# Patient Record
Sex: Male | Born: 1935 | Race: White | Hispanic: No | Marital: Married | Smoking: Former smoker
Health system: Southern US, Community
[De-identification: ages and names within clinical notes are randomized; demographics above are authoritative.]

## PROBLEM LIST (undated history)

## (undated) DIAGNOSIS — S7290XA Unspecified fracture of unspecified femur, initial encounter for closed fracture: Secondary | ICD-10-CM

## (undated) DIAGNOSIS — I509 Heart failure, unspecified: Secondary | ICD-10-CM

## (undated) DIAGNOSIS — I10 Essential (primary) hypertension: Secondary | ICD-10-CM

## (undated) DIAGNOSIS — I4891 Unspecified atrial fibrillation: Secondary | ICD-10-CM

## (undated) DIAGNOSIS — E119 Type 2 diabetes mellitus without complications: Secondary | ICD-10-CM

## (undated) DIAGNOSIS — K219 Gastro-esophageal reflux disease without esophagitis: Secondary | ICD-10-CM

## (undated) HISTORY — PX: CARDIAC DEFIBRILLATOR PLACEMENT: SHX171

## (undated) HISTORY — DX: Type 2 diabetes mellitus without complications: E11.9

## (undated) HISTORY — DX: Essential (primary) hypertension: I10

## (undated) HISTORY — PX: CARDIAC SURGERY: SHX584

---

## 2006-08-07 ENCOUNTER — Ambulatory Visit: Payer: Self-pay | Admitting: Podiatry

## 2006-08-11 ENCOUNTER — Ambulatory Visit: Payer: Self-pay | Admitting: Podiatry

## 2006-09-01 ENCOUNTER — Inpatient Hospital Stay: Payer: Self-pay | Admitting: Podiatry

## 2006-09-02 ENCOUNTER — Other Ambulatory Visit: Payer: Self-pay

## 2007-01-16 ENCOUNTER — Ambulatory Visit: Payer: Self-pay | Admitting: Family Medicine

## 2008-04-29 ENCOUNTER — Observation Stay: Payer: Self-pay | Admitting: Internal Medicine

## 2009-10-07 ENCOUNTER — Ambulatory Visit: Payer: Self-pay | Admitting: Family Medicine

## 2009-10-21 ENCOUNTER — Ambulatory Visit: Payer: Self-pay | Admitting: Vascular Surgery

## 2010-01-14 ENCOUNTER — Ambulatory Visit: Payer: Self-pay | Admitting: Internal Medicine

## 2010-03-06 ENCOUNTER — Ambulatory Visit: Payer: Self-pay | Admitting: Internal Medicine

## 2011-01-21 ENCOUNTER — Ambulatory Visit: Payer: Self-pay | Admitting: Vascular Surgery

## 2011-04-18 ENCOUNTER — Ambulatory Visit: Payer: Self-pay

## 2011-04-19 ENCOUNTER — Ambulatory Visit: Payer: Self-pay

## 2011-06-22 ENCOUNTER — Observation Stay: Payer: Self-pay | Admitting: Internal Medicine

## 2011-08-17 ENCOUNTER — Other Ambulatory Visit: Payer: Self-pay | Admitting: Podiatry

## 2013-02-07 ENCOUNTER — Ambulatory Visit: Payer: Self-pay | Admitting: Vascular Surgery

## 2013-03-14 ENCOUNTER — Ambulatory Visit: Payer: Self-pay | Admitting: Vascular Surgery

## 2013-03-14 LAB — BASIC METABOLIC PANEL
Calcium, Total: 9.1 mg/dL (ref 8.5–10.1)
Chloride: 107 mmol/L (ref 98–107)
Co2: 26 mmol/L (ref 21–32)
Creatinine: 0.81 mg/dL (ref 0.60–1.30)
EGFR (African American): >60
Glucose: 134 mg/dL — ABNORMAL HIGH (ref 65–99)
Osmolality: 279 (ref 275–301)

## 2013-03-14 LAB — CBC
MCH: 30.8 pg (ref 26.0–34.0)
MCHC: 34.6 g/dL (ref 32.0–36.0)
Platelet: 203 10*3/uL (ref 150–440)
RDW: 13.7 % (ref 11.5–14.5)

## 2013-03-20 ENCOUNTER — Inpatient Hospital Stay: Payer: Self-pay | Admitting: Vascular Surgery

## 2013-03-21 LAB — CBC WITH DIFFERENTIAL/PLATELET
Basophil #: 0 10*3/uL (ref 0.0–0.1)
Basophil %: 0.3 %
Eosinophil %: 1.2 %
HCT: 35.5 % — ABNORMAL LOW (ref 40.0–52.0)
Lymphocyte #: 1.4 10*3/uL (ref 1.0–3.6)
Lymphocyte %: 13.5 %
MCH: 31 pg (ref 26.0–34.0)
RDW: 13.9 % (ref 11.5–14.5)
WBC: 10.5 10*3/uL (ref 3.8–10.6)

## 2013-03-21 LAB — BASIC METABOLIC PANEL
Anion Gap: 4 — ABNORMAL LOW (ref 7–16)
Co2: 24 mmol/L (ref 21–32)
Creatinine: 0.85 mg/dL (ref 0.60–1.30)
Glucose: 152 mg/dL — ABNORMAL HIGH (ref 65–99)
Potassium: 4.2 mmol/L (ref 3.5–5.1)

## 2013-03-21 LAB — APTT: Activated PTT: 37.6 secs — ABNORMAL HIGH (ref 23.6–35.9)

## 2013-03-21 LAB — PROTIME-INR: Prothrombin Time: 14.7 secs (ref 11.5–14.7)

## 2014-04-08 ENCOUNTER — Ambulatory Visit: Payer: Self-pay | Admitting: Family Medicine

## 2014-10-21 ENCOUNTER — Ambulatory Visit
Admit: 2014-10-21 | Disposition: A | Discharge status: Home / Self Care | Payer: Self-pay | Attending: Cardiology | Admitting: Cardiology

## 2014-10-21 DIAGNOSIS — I2510 Atherosclerotic heart disease of native coronary artery without angina pectoris: Secondary | ICD-10-CM

## 2014-10-21 LAB — BASIC METABOLIC PANEL
ANION GAP: 5 — AB (ref 7–16)
BUN: 15 mg/dL
Calcium, Total: 9 mg/dL
Chloride: 108 mmol/L
Co2: 24 mmol/L
Creatinine: 0.8 mg/dL
EGFR (Non-African Amer.): >60
Glucose: 157 mg/dL — ABNORMAL HIGH
POTASSIUM: 4.3 mmol/L
SODIUM: 137 mmol/L

## 2014-10-21 LAB — CBC WITH DIFFERENTIAL/PLATELET
BASOS ABS: 0 10*3/uL (ref 0.0–0.1)
BASOS PCT: 0.6 %
EOS PCT: 3.4 %
Eosinophil #: 0.2 10*3/uL (ref 0.0–0.7)
HCT: 36.1 % — AB (ref 40.0–52.0)
HGB: 11.9 g/dL — ABNORMAL LOW (ref 13.0–18.0)
LYMPHS ABS: 1.6 10*3/uL (ref 1.0–3.6)
Lymphocyte %: 22.4 %
MCH: 29.4 pg (ref 26.0–34.0)
MCHC: 32.8 g/dL (ref 32.0–36.0)
MCV: 90 fL (ref 80–100)
MONO ABS: 0.4 x10 3/mm (ref 0.2–1.0)
MONOS PCT: 5.8 %
NEUTROS ABS: 4.9 10*3/uL (ref 1.4–6.5)
NEUTROS PCT: 67.8 %
PLATELETS: 206 10*3/uL (ref 150–440)
RBC: 4.03 10*6/uL — ABNORMAL LOW (ref 4.40–5.90)
RDW: 13.7 % (ref 11.5–14.5)
WBC: 7.2 10*3/uL (ref 3.8–10.6)

## 2014-10-21 LAB — PROTIME-INR
INR: 1.1
PROTHROMBIN TIME: 14.8 s

## 2014-10-21 LAB — APTT: Activated PTT: 35.1 secs (ref 23.6–35.9)

## 2014-10-24 NOTE — Op Note (Signed)
PATIENT NAME:  Riley CruiseLANPHEAR, Justan MR#:  [id number] DATE OF BIRTH:  [birth year]  DATE OF OPERATION:  03/20/2013  PREOPERATIVE DIAGNOSES: 1.  High-grade right carotid artery stenosis.  2.  Coronary disease, status post coronary bypass grafting.  3.  Hyperlipidemia.  4.  Diabetes.  5.  Hypertension.   POSTOPERATIVE DIAGNOSES: 1.  High-grade right carotid artery stenosis.  2.  Coronary disease, status post coronary bypass grafting.  3.  Hyperlipidemia.  4.  Diabetes.  5.  Hypertension.   PROCEDURE: Right carotid endarterectomy.   SURGEON: Haeden Hudock.   ANESTHESIA: General.   BLOOD LOSS: 100 mL.   INDICATION FOR PROCEDURE: This is a gentleman I have followed for some time for his carotid artery disease. He has had a gradual progression of his disease over time and now has an approximately 80% stenosis by CT angiogram. He was brought in for an endarterectomy for stroke risk reduction. Risks and benefits were discussed. Informed consent was obtained.   DESCRIPTION OF PROCEDURE: The patient was brought to the operative suite and after an adequate level of general anesthesia was obtained he was placed in modified beach chair position. His right neck and chest were sterilely prepped and draped and a sterile surgical field was created. An incision was created along the anterior border of the sternocleidomastoid and dissected down through the platysma with electrocautery. The sternocleidomastoid was retracted laterally. The facial vein was ligated and divided between silk ties.    He had a quite low bifurcation which was known by his CT angiogram, and the incision was able to be kept reasonably small, in the lower portion of the neck. The carotid bifurcation was identified. The common carotid artery, external carotid artery, superior thyroid artery and internal carotid artery distal lesions were all encircled with vessel loops. The bulb was anesthetized with 1% lidocaine. The patient was systemically  heparinized with 6000 units of intravenous heparin for systemic anticoagulation.   Control was then pulled up on the vessel loops after this had circulated for over 4 minutes. An anterior wall arteriotomy was created with an 11 blade and extended with Potts scissors. The Pruitt-Inahara shunt was then placed. First, the internal carotid artery was flushed and de-aired, then the common carotid artery. Approximately 2 minutes elapsed from clamp to shunt placement.   Endarterectomy was then performed in standard fashion. An eversion endarterectomy was performed on the external carotid artery. The proximal end-point was cut flush with tenotomy scissors and a nice feathered distal end-point was created with gentle traction on the distal end-point in the internal carotid artery. There was a small rim of the posterior wall with the artery, where the endarterectomy was repaired with 7-0 Prolene sutures without difficulty. All loose flecks were removed and the wound was copiously irrigated with heparinized saline.   The vessel was quite large in the common carotid artery, and also generous in the internal carotid artery so I elected for a primary closure. A 6-0 Prolene was started at the distal end-point and run one-half the length of the arteriotomy, and a second 6-0 Prolene was started at the proximal end-point and run approximately one-quarter the length of the arteriotomy. The shunt was then removed. The vessel was flushed from the internal and external and common carotid arteries and then locally heparinized, and then the suture line was completed, flushing through the external carotid artery and allowing several cardiac cycles to traverse up the external carotid artery prior to releasing control. A single 6-0 Prolene patch suture was used  for hemostasis. Hemostasis was complete.   The wound was then irrigated with saline. Surgicel and Evicel topical hemostatic agents were placed and hemostasis was complete. The  sternocleidomastoid space was closed with interrupted 3-0 Vicryl sutures. The platysma was closed with a running 3-0 Vicryl and the skin was closed with a 4-0 Monocryl. Dermabond was placed as a dressing.   The patient was awakened from anesthesia, extubated and taken to the recovery room in stable condition, having tolerated the procedure well. He was moving all 4 extremities and following commands.     ____________________________ Annice Needy, MD jsd:dm D: 03/20/2013 09:30:12 ET T: 03/20/2013 09:40:11 ET JOB#: [id number]  cc: Annice Needy, MD, <Dictator> Jorje Guild. Beckey Downing, MD Annice Needy MD ELECTRONICALLY SIGNED 03/27/2013 11:39

## 2014-10-26 NOTE — H&P (Signed)
PATIENT NAME:  Riley CruiseLANPHEAR, Aadam MR#:  [id number] DATE OF BIRTH:  1929/09/09  DATE OF ADMISSION:  06/22/2011  PRIMARY CARE PHYSICIAN: Barry BrunnerGlenn Willett, MD   CHIEF COMPLAINT: Weakness.   HISTORY OF PRESENT ILLNESS: This is a 81 year old man who has been having a cough for the past two days. He has been feeling weak for the last few days. The wife this afternoon noticed that her husband was driving a little bit erratic and had him pull over. Then she couldn't even get him out of the car. He couldn't get out of the car by himself. He needed help from a man that was walking by and got him around to the passenger side where she was wanting to drive to the hospital but he did not want to go so they drove home. Then they could not get him out of the car when he was home. They called 9-1-1 and he couldn't get out of the car to walk to the ambulance. They needed to help him. He has been coughing for two days, being very weak, could not get out of the car, unable to ambulate. Complains of total body weakness. He has a low-grade fever. He had some nausea and vomiting this a.m. He normally has balance issues secondary to the neuropathy with diabetes. In the ER, his CT scan of the head showed no acute intracranial process. Hospitalist services were contacted for evaluation since he cannot walk.   PAST MEDICAL HISTORY:  1. Coronary artery disease.  2. Diabetes.  3. Neuropathy. 4. Peripheral vascular disease with carotid stenosis.  5. Benign prostatic hypertrophy.  6. Hypertension.  7. Hyperlipidemia.   PAST SURGICAL HISTORY:  1. CABG. 2. Defibrillator. 3. Pacemaker. 4. Infected toe surgery.   ALLERGIES: No known drug allergies.   MEDICATIONS:  1. Metformin 1000 mg twice a day. 2. Flomax 0.4 mg b.i.d.  3. Plavix 75 mg daily.  4. Lisinopril 10 mg daily.  5. Lovastatin 40 mg daily.  6. Aspirin 81 mg daily.   SOCIAL HISTORY: No smoking. No alcohol. No drug use. Used to build houses in the past.   FAMILY  HISTORY: Mother died at 7885, diabetic complications. Father died at 5083 of coronary artery disease.   REVIEW OF SYSTEMS: CONSTITUTIONAL: Positive for fever. Positive for cold sweats. Positive for weakness. EYES: He does wear glasses and has focusing issues. EARS, NOSE, MOUTH, AND THROAT: Decreased hearing. Positive for runny nose. No sore throat. No difficulty swallowing. CARDIOVASCULAR: No chest pain. No palpitations. RESPIRATORY: Positive for cough. No shortness of breath. No sputum. No hemoptysis. GASTROINTESTINAL: Positive for nausea. Positive for vomiting. No abdominal pain. No diarrhea. No constipation. No bright red blood per rectum. No melena. GENITOURINARY: Positive for burning on urination. No hematuria. MUSCULOSKELETAL: Positive for aching in his joints. NEUROLOGIC: No fainting or blackouts. PSYCHIATRIC: No anxiety or depression. ENDOCRINE: No thyroid problems. HEMATOLOGIC/LYMPHATIC: No anemia. No easy bruising or bleeding.   PHYSICAL EXAMINATION:   VITAL SIGNS: Temperature 99.1, pulse 71, respirations 18, blood pressure 144/68, pulse oximetry 93% on room air.   GENERAL: No respiratory distress.   EYES: Conjunctivae and lids normal. Pupils equal, round, and reactive to light. Extraocular muscles intact. No nystagmus.   EARS, NOSE, MOUTH, AND THROAT: Tympanic membrane no erythema. Nasal mucosa no erythema. Throat no erythema. No exudate seen. Lips and gums normal.   NECK: No JVD. No bruits. No lymphadenopathy. No thyromegaly. No thyroid nodules palpated.   RESPIRATORY: Decreased breath sounds bilaterally, coughing with deep breath. Coarse  breath sounds. No rales or wheeze heard.   CARDIOVASCULAR: S1, S2 normal. 2/6 systolic ejection murmur. Carotid upstroke 2+ bilaterally. Positive bruit on the right.   EXTREMITIES: Dorsalis pedis pulses 2+ bilaterally. No edema of the lower extremity.   ABDOMEN: Soft, nontender. No organomegaly/splenomegaly. Normoactive bowel sounds. No masses felt.    LYMPHATIC: No lymph nodes in the neck.   MUSCULOSKELETAL: No clubbing, edema, or cyanosis.   SKIN: No rashes or ulcers seen.   NEUROLOGIC: Cranial nerves II through XII grossly intact. Deep tendon reflexes 1+ bilateral lower extremity. Power 5/5 upper and lower extremities.   PSYCHIATRIC: The patient is oriented to person, place, and time.   LABORATORY, DIAGNOSTIC, AND RADIOLOGICAL DATA: Urinalysis shows 1+ ketones, trace leukocyte esterase. Influenza negative. CT scan of the head no acute intracranial process. Chest x-ray negative. White blood cell count 7.9, hemoglobin and hematocrit 12.3 and 37.1, platelet count 160, glucose 149, BUN 12, creatinine 1.0, sodium 139, potassium 4.1, chloride 105, CO2 24, calcium 8.5. Liver function tests normal. Troponin negative. EKG paced, 69 beats per minute.   ASSESSMENT AND PLAN:  1. Weakness with dehydration. The patient states that his strength is getting a little bit better with the IV fluids. Since he was unable to get out of the car, the wife feels more comfortable watching him overnight. Will admit as an observation. Get a PT evaluation. Most likely this is secondary to dehydration and infection.  2. Bronchitis. Will give Rocephin and Zithromax. Urinalysis also positive. Will send off a urine culture. Give IV fluid hydration. Cough suppression.  3. History of cerebrovascular accident. Plavix and aspirin continued; also, history of carotid stenosis.  4. Diabetes with neuropathy. Continue metformin and add sliding scale.  5. Benign prostatic hypertrophy. Continue Flomax.  6. Hyperlipidemia. Continue lovastatin. Can consider stopping lovastatin if weakness persists after infection.  7. Hypertension. Continue lisinopril.   TIME SPENT ON ADMISSION: 55 minutes.   CODE STATUS: The patient is a FULL CODE.   ____________________________ Herschell Dimes. Renae Gloss, MD rjw:drc D: 06/22/2011 19:44:20 ET T: 06/23/2011 19:14:78 ET JOB#: [id number]  cc: Herschell Dimes. Renae Gloss, MD, <Dictator> Jorje Guild. Beckey Downing, MD Salley Scarlet MD ELECTRONICALLY SIGNED 07/20/2011 15:54

## 2014-10-29 ENCOUNTER — Ambulatory Visit
Admit: 2014-10-29 | Disposition: A | Discharge status: Home / Self Care | Payer: Self-pay | Attending: Cardiology | Admitting: Cardiology

## 2014-11-02 NOTE — Op Note (Addendum)
PATIENT NAME:  Riley CruiseLANPHEAR, Abdoulaye MR#:  [id number] DATE OF BIRTH:  1930-06-22  DATE OF PROCEDURE:  10/29/2014  PRIMARY CARE PHYSICIAN: Leandrew KoyanagiAmarjit S. Virk, MD   PREPROCEDURE DIAGNOSES: Ischemic cardiomyopathy, chronic systolic congestive heart failure, and biventricular implantable cardioverter defibrillator at elective replacement indication.   INDICATION: The patient is an 81 year old gentleman with known coronary artery disease, status post bypass graft surgery, with ischemic cardiomyopathy and chronic systolic congestive heart failure. The patient has a biventricular ICD. Recent interrogation has shown the ICD generator was at elective replacement indication.   PROCEDURE IN DETAIL: The patient was brought to the operating room in a fasting state. The left pectoral region was prepped and draped in the usual sterile manner. Anesthesia was obtained with 1% Xylocaine locally. A 6 cm incision was performed over the left pectoral region. The ICD generator was retrieved by electrocautery and blunt dissection. The leads were disconnected from the old ICD generator and connected to a new biventricular ICD generator (Medtronic [id number], Viva XT CRT-D. The pocket was irrigated with gentamicin solution. The new biventricular ICD generator was positioned in the pocket. The pocket was closed with 2-0 and 4-0 Vicryl, respectively. Steri-Strips and pressure dressing were applied     ____________________________ Marcina MillardAlexander Antinio Sanderfer, MD ap:mw D: 10/29/2014 13:03:35 ET T: 10/29/2014 16:33:12 ET JOB#: [id number]  cc: Marcina MillardAlexander Khyleigh Furney, MD, <Dictator> Marcina MillardALEXANDER Adriauna Campton MD ELECTRONICALLY SIGNED 11/01/2014 11:11

## 2015-04-06 ENCOUNTER — Ambulatory Visit (INDEPENDENT_AMBULATORY_CARE_PROVIDER_SITE_OTHER): Payer: Medicare Other | Admitting: Podiatry

## 2015-04-06 ENCOUNTER — Ambulatory Visit (INDEPENDENT_AMBULATORY_CARE_PROVIDER_SITE_OTHER): Payer: Medicare Other

## 2015-04-06 ENCOUNTER — Telehealth: Payer: Self-pay | Admitting: *Deleted

## 2015-04-06 ENCOUNTER — Encounter: Payer: Self-pay | Admitting: Podiatry

## 2015-04-06 DIAGNOSIS — R52 Pain, unspecified: Secondary | ICD-10-CM

## 2015-04-06 DIAGNOSIS — R269 Unspecified abnormalities of gait and mobility: Secondary | ICD-10-CM

## 2015-04-06 DIAGNOSIS — E1142 Type 2 diabetes mellitus with diabetic polyneuropathy: Secondary | ICD-10-CM | POA: Diagnosis not present

## 2015-04-06 MED ORDER — GABAPENTIN 100 MG PO CAPS: 100.0000 mg | ORAL_CAPSULE | Freq: Every day | ORAL | Status: DC

## 2015-04-06 NOTE — Telephone Encounter (Signed)
[facility] - [address] states Dr. Al Corpus wrote pt Gabapentin  #3 and she wanted to know if he meant #30.  I reviewed chart and it was #30 +3 refills.  Called to [facility] with the correction.

## 2015-04-06 NOTE — Progress Notes (Signed)
   Subjective:    Patient ID: Riley Sanchez., male    DOB: 12-Sep-1929, 81 y.o.   MRN: [id number]  HPI: He presents today with a several month history of numbness in his feet. He states that I can hardly feel the pedals to drive any longer and my shoes sometimes come off of my feet without me knowing. He also relates some tingling and burning at nighttime. He states his blood sugars under control with a good hemoglobin A1c of 7.0. He also states that he is unstable and feels on the verge of falling at all times. He states that he gets dizzy occasionally and without holding onto the walls or friends he would fall.  Review of Systems  HENT: Positive for hearing loss.   Musculoskeletal: Positive for myalgias and gait problem.  Neurological: Positive for weakness and numbness.  All other systems reviewed and are negative.      Objective:   Physical Exam: 81 year old male presents vital signs stable alert and oriented 3. Walks with a slow deliberate gait in no acute distress. Pulses are strongly palpable. Neurologic sensorium is decreased per Semmes-Weinstein monofilament and vibratory sensation to the level of the ankles. Deep tendon reflexes are not elicitable muscle strength +4 out of 5 dorsiflexion plantar flexors and inverters and everters all intrinsic musculature is intact. Orthopedic evaluation demonstrate all joints distal to the ankle for range of motion without crepitation. Mild HAV deformity hammertoe deformity noted bilateral but are asymptomatic. Radiographic evaluation does not demonstrate any type of major osseous abnormalities. Cutaneous evaluation demonstrates no open lesions or wounds. Skin is supple and well hydrated.        Assessment & Plan:  Assessment: Diabetic peripheral neuropathy bilateral.  Plan: Discussed etiology pathology conservative versus surgical therapies. I highly recommended that he discontinue the use of his automobile particularly not being able to feel  the pedals. I suggested that he give up driving completely. I did offer him a gabapentin 100 mg 1 by mouth daily at bedtime 30. We will try this for a month and see how this works for him. I also suggested physical therapy with gait training. I will follow-up with him in 1 month questions or concerns he will notify us immediately.  Arbutus Ped DPM

## 2015-05-06 ENCOUNTER — Encounter: Payer: Medicare Other | Admitting: Podiatry

## 2015-05-18 ENCOUNTER — Emergency Department: Payer: Medicare Other

## 2015-05-18 ENCOUNTER — Encounter: Payer: Self-pay | Admitting: Emergency Medicine

## 2015-05-18 ENCOUNTER — Inpatient Hospital Stay
Admission: EM | Admit: 2015-05-18 | Discharge: 2015-05-22 | DRG: 481 | Disposition: A | Discharge status: Skilled Nursing Facility | Payer: Medicare Other | Attending: Internal Medicine | Admitting: Internal Medicine

## 2015-05-18 DIAGNOSIS — Z7982 Long term (current) use of aspirin: Secondary | ICD-10-CM

## 2015-05-18 DIAGNOSIS — Z7901 Long term (current) use of anticoagulants: Secondary | ICD-10-CM

## 2015-05-18 DIAGNOSIS — I480 Paroxysmal atrial fibrillation: Secondary | ICD-10-CM | POA: Diagnosis present

## 2015-05-18 DIAGNOSIS — Z79899 Other long term (current) drug therapy: Secondary | ICD-10-CM

## 2015-05-18 DIAGNOSIS — Z951 Presence of aortocoronary bypass graft: Secondary | ICD-10-CM

## 2015-05-18 DIAGNOSIS — Z881 Allergy status to other antibiotic agents status: Secondary | ICD-10-CM

## 2015-05-18 DIAGNOSIS — Y92009 Unspecified place in unspecified non-institutional (private) residence as the place of occurrence of the external cause: Secondary | ICD-10-CM

## 2015-05-18 DIAGNOSIS — E1142 Type 2 diabetes mellitus with diabetic polyneuropathy: Secondary | ICD-10-CM | POA: Diagnosis present

## 2015-05-18 DIAGNOSIS — Z87891 Personal history of nicotine dependence: Secondary | ICD-10-CM

## 2015-05-18 DIAGNOSIS — I5022 Chronic systolic (congestive) heart failure: Secondary | ICD-10-CM | POA: Diagnosis present

## 2015-05-18 DIAGNOSIS — K5900 Constipation, unspecified: Secondary | ICD-10-CM | POA: Diagnosis not present

## 2015-05-18 DIAGNOSIS — Z9889 Other specified postprocedural states: Secondary | ICD-10-CM

## 2015-05-18 DIAGNOSIS — Z888 Allergy status to other drugs, medicaments and biological substances status: Secondary | ICD-10-CM | POA: Diagnosis not present

## 2015-05-18 DIAGNOSIS — Z95810 Presence of automatic (implantable) cardiac defibrillator: Secondary | ICD-10-CM

## 2015-05-18 DIAGNOSIS — I255 Ischemic cardiomyopathy: Secondary | ICD-10-CM | POA: Diagnosis present

## 2015-05-18 DIAGNOSIS — Z419 Encounter for procedure for purposes other than remedying health state, unspecified: Secondary | ICD-10-CM

## 2015-05-18 DIAGNOSIS — W19XXXA Unspecified fall, initial encounter: Secondary | ICD-10-CM | POA: Diagnosis present

## 2015-05-18 DIAGNOSIS — S72002A Fracture of unspecified part of neck of left femur, initial encounter for closed fracture: Secondary | ICD-10-CM | POA: Diagnosis present

## 2015-05-18 DIAGNOSIS — I110 Hypertensive heart disease with heart failure: Secondary | ICD-10-CM | POA: Diagnosis present

## 2015-05-18 DIAGNOSIS — Z8781 Personal history of (healed) traumatic fracture: Secondary | ICD-10-CM

## 2015-05-18 DIAGNOSIS — S72009A Fracture of unspecified part of neck of unspecified femur, initial encounter for closed fracture: Secondary | ICD-10-CM | POA: Diagnosis present

## 2015-05-18 DIAGNOSIS — Z7902 Long term (current) use of antithrombotics/antiplatelets: Secondary | ICD-10-CM | POA: Diagnosis not present

## 2015-05-18 LAB — BASIC METABOLIC PANEL
ANION GAP: 5 (ref 5–15)
BUN: 15 mg/dL (ref 6–20)
CHLORIDE: 107 mmol/L (ref 101–111)
CO2: 26 mmol/L (ref 22–32)
Calcium: 9.2 mg/dL (ref 8.9–10.3)
Creatinine, Ser: 0.86 mg/dL (ref 0.61–1.24)
GFR calc non Af Amer: >60 mL/min (ref >60)
Glucose, Bld: 179 mg/dL — ABNORMAL HIGH (ref 65–99)
POTASSIUM: 4.7 mmol/L (ref 3.5–5.1)
Sodium: 138 mmol/L (ref 135–145)

## 2015-05-18 LAB — CBC WITH DIFFERENTIAL/PLATELET
BASOS PCT: 0 %
Basophils Absolute: 0 10*3/uL (ref 0–0.1)
Eosinophils Absolute: 0.1 10*3/uL (ref 0–0.7)
Eosinophils Relative: 1 %
HEMATOCRIT: 37.6 % — AB (ref 40.0–52.0)
HEMOGLOBIN: 12.4 g/dL — AB (ref 13.0–18.0)
Lymphocytes Relative: 10 %
Lymphs Abs: 1.3 10*3/uL (ref 1.0–3.6)
MCH: 29.5 pg (ref 26.0–34.0)
MCHC: 33 g/dL (ref 32.0–36.0)
MCV: 89.5 fL (ref 80.0–100.0)
MONOS PCT: 4 %
Monocytes Absolute: 0.6 10*3/uL (ref 0.2–1.0)
NEUTROS ABS: 11.1 10*3/uL — AB (ref 1.4–6.5)
NEUTROS PCT: 85 %
Platelets: 210 10*3/uL (ref 150–440)
RBC: 4.2 MIL/uL — ABNORMAL LOW (ref 4.40–5.90)
RDW: 13.5 % (ref 11.5–14.5)
WBC: 13.1 10*3/uL — ABNORMAL HIGH (ref 3.8–10.6)

## 2015-05-18 LAB — PROTIME-INR
INR: 2.6
Prothrombin Time: 27.5 seconds — ABNORMAL HIGH (ref 11.4–15.0)

## 2015-05-18 LAB — TYPE AND SCREEN
ABO/RH(D): A POS
Antibody Screen: NEGATIVE

## 2015-05-18 LAB — MRSA PCR SCREENING: MRSA by PCR: NEGATIVE

## 2015-05-18 LAB — GLUCOSE, CAPILLARY: GLUCOSE-CAPILLARY: 196 mg/dL — AB (ref 65–99)

## 2015-05-18 LAB — ABO/RH: ABO/RH(D): A POS

## 2015-05-18 LAB — ALBUMIN: ALBUMIN: 4.2 g/dL (ref 3.5–5.0)

## 2015-05-18 LAB — APTT: APTT: 43 s — AB (ref 24–36)

## 2015-05-18 MED ORDER — ACETAMINOPHEN 650 MG RE SUPP: 650.0000 mg | Freq: Four times a day (QID) | RECTAL | Status: DC | PRN

## 2015-05-18 MED ORDER — MORPHINE SULFATE (PF) 2 MG/ML IV SOLN
2.0000 mg | INTRAVENOUS | Status: DC | PRN
  Administered 2015-05-19 (×2): 2 mg via INTRAVENOUS
  Filled 2015-05-18 (×2): qty 1

## 2015-05-18 MED ORDER — ONDANSETRON HCL 4 MG/2ML IJ SOLN
4.0000 mg | Freq: Four times a day (QID) | INTRAMUSCULAR | Status: DC | PRN
  Administered 2015-05-19: 4 mg via INTRAVENOUS

## 2015-05-18 MED ORDER — SODIUM CHLORIDE 0.9 % IV SOLN
INTRAVENOUS | Status: DC
  Administered 2015-05-18 – 2015-05-19 (×3): via INTRAVENOUS

## 2015-05-18 MED ORDER — PANTOPRAZOLE SODIUM 40 MG PO TBEC
40.0000 mg | DELAYED_RELEASE_TABLET | Freq: Every day | ORAL | Status: DC
  Administered 2015-05-18 – 2015-05-22 (×4): 40 mg via ORAL
  Filled 2015-05-18 (×8): qty 1

## 2015-05-18 MED ORDER — ONDANSETRON HCL 4 MG/2ML IJ SOLN
4.0000 mg | Freq: Once | INTRAMUSCULAR | Status: AC
  Administered 2015-05-18: 4 mg via INTRAVENOUS
  Filled 2015-05-18: qty 2

## 2015-05-18 MED ORDER — LISINOPRIL 10 MG PO TABS
10.0000 mg | ORAL_TABLET | Freq: Every day | ORAL | Status: DC
  Administered 2015-05-19 – 2015-05-22 (×4): 10 mg via ORAL
  Filled 2015-05-18 (×4): qty 1

## 2015-05-18 MED ORDER — ONDANSETRON HCL 4 MG/2ML IJ SOLN
4.0000 mg | INTRAMUSCULAR | Status: AC
Start: 2015-05-18 — End: 2015-05-18
  Administered 2015-05-18: 4 mg via INTRAVENOUS

## 2015-05-18 MED ORDER — ONDANSETRON HCL 4 MG/2ML IJ SOLN
INTRAMUSCULAR | Status: AC
  Filled 2015-05-18: qty 2

## 2015-05-18 MED ORDER — PRAVASTATIN SODIUM 40 MG PO TABS
40.0000 mg | ORAL_TABLET | Freq: Every day | ORAL | Status: DC
  Administered 2015-05-19 – 2015-05-21 (×3): 40 mg via ORAL
  Filled 2015-05-18 (×3): qty 1

## 2015-05-18 MED ORDER — ACETAMINOPHEN 325 MG PO TABS: 650.0000 mg | ORAL_TABLET | Freq: Four times a day (QID) | ORAL | Status: DC | PRN

## 2015-05-18 MED ORDER — NITROGLYCERIN 0.4 MG SL SUBL: 0.4000 mg | SUBLINGUAL_TABLET | SUBLINGUAL | Status: DC | PRN

## 2015-05-18 MED ORDER — ONDANSETRON HCL 4 MG PO TABS: 4.0000 mg | ORAL_TABLET | Freq: Four times a day (QID) | ORAL | Status: DC | PRN

## 2015-05-18 MED ORDER — GABAPENTIN 100 MG PO CAPS
100.0000 mg | ORAL_CAPSULE | Freq: Every day | ORAL | Status: DC
  Administered 2015-05-18 – 2015-05-21 (×4): 100 mg via ORAL
  Filled 2015-05-18 (×4): qty 1

## 2015-05-18 MED ORDER — OXYCODONE HCL 5 MG PO TABS: 5.0000 mg | ORAL_TABLET | ORAL | Status: DC | PRN

## 2015-05-18 MED ORDER — DEXTROSE 5 % IV SOLN: 10.0000 mg | Freq: Once | INTRAVENOUS | Status: DC

## 2015-05-18 MED ORDER — DOCUSATE SODIUM 100 MG PO CAPS
100.0000 mg | ORAL_CAPSULE | Freq: Two times a day (BID) | ORAL | Status: DC
  Administered 2015-05-18: 100 mg via ORAL
  Filled 2015-05-18: qty 1

## 2015-05-18 MED ORDER — SENNOSIDES-DOCUSATE SODIUM 8.6-50 MG PO TABS: 1.0000 | ORAL_TABLET | Freq: Every evening | ORAL | Status: DC | PRN

## 2015-05-18 MED ORDER — VITAMIN D 1000 UNITS PO TABS
1000.0000 [IU] | ORAL_TABLET | Freq: Every day | ORAL | Status: DC
  Administered 2015-05-20 – 2015-05-22 (×3): 1000 [IU] via ORAL
  Filled 2015-05-18 (×3): qty 1

## 2015-05-18 MED ORDER — TAMSULOSIN HCL 0.4 MG PO CAPS
0.4000 mg | ORAL_CAPSULE | Freq: Two times a day (BID) | ORAL | Status: DC
  Administered 2015-05-18 – 2015-05-22 (×7): 0.4 mg via ORAL
  Filled 2015-05-18 (×7): qty 1

## 2015-05-18 MED ORDER — VITAMIN K1 10 MG/ML IJ SOLN
10.0000 mg | Freq: Once | INTRAVENOUS | Status: AC
  Administered 2015-05-18: 10 mg via INTRAVENOUS
  Filled 2015-05-18: qty 1

## 2015-05-18 MED ORDER — MORPHINE SULFATE (PF) 4 MG/ML IV SOLN
4.0000 mg | Freq: Once | INTRAVENOUS | Status: AC
  Administered 2015-05-18: 4 mg via INTRAVENOUS
  Filled 2015-05-18: qty 1

## 2015-05-18 MED ORDER — INSULIN ASPART 100 UNIT/ML ~~LOC~~ SOLN
0.0000 [IU] | Freq: Three times a day (TID) | SUBCUTANEOUS | Status: DC
  Administered 2015-05-20 – 2015-05-22 (×5): 2 [IU] via SUBCUTANEOUS
  Administered 2015-05-22: 3 [IU] via SUBCUTANEOUS
  Filled 2015-05-18: qty 3
  Filled 2015-05-18 (×5): qty 2

## 2015-05-18 MED ORDER — DIPHENHYDRAMINE HCL 25 MG PO TABS
25.0000 mg | ORAL_TABLET | Freq: Every evening | ORAL | Status: DC | PRN
  Filled 2015-05-18: qty 1

## 2015-05-18 NOTE — Consult Note (Signed)
ORTHOPAEDIC CONSULTATION  REQUESTING PHYSICIAN: Enedina Finner, MD  Chief Complaint: Left hip pain status post fall  HPI: Riley Sanchez. is a 81 y.o. male who complains of  left hip pain status post fall at home. Patient has diabetic peripheral neuropathy in the lower extremities and has issues with his balance according to his wife. He is seen in his hospital room today with his wife and son at the bedside. Patient fell and landed on his left side. X-rays taken in the [facility] emergency department reveal a valgus impacted femoral neck hip fracture. Patient is admitted to the hospitalist service and I am consulted for evaluation and management of the hip fracture.  He is currently comfortable. His drowsy but arousable.  Past Medical History  Diagnosis Date  . Diabetes mellitus without complication (HCC)   . Hypertension    Past Surgical History  Procedure Laterality Date  . Cardiac surgery      bypass in 89  . Cardiac defibrillator placement      replaced 1/16, medtronic  . Cardiac defibrillator placement      medtronic 1/16   Social History   Social History  . Marital Status: Married    Spouse Name: N/A  . Number of Children: N/A  . Years of Education: N/A   Social History Main Topics  . Smoking status: Former Games developer  . Smokeless tobacco: None  . Alcohol Use: No  . Drug Use: No  . Sexual Activity: Not Asked   Other Topics Concern  . None   Social History Narrative   History reviewed. No pertinent family history. Allergies  Allergen Reactions  . Bactrim [Sulfamethoxazole-Trimethoprim] Other (See Comments)    Reaction:  Unknown    Prior to Admission medications   Medication Sig Start Date End Date Taking? Authorizing Provider  aspirin EC 81 MG tablet Take 81 mg by mouth daily.   Yes Historical Provider, MD  cholecalciferol (VITAMIN D) 1000 UNITS tablet Take 1,000 Units by mouth daily.   Yes Historical Provider, MD  diphenhydrAMINE (BENADRYL) 25 MG  tablet Take 25 mg by mouth at bedtime as needed for sleep.   Yes Historical Provider, MD  gabapentin (NEURONTIN) 100 MG capsule Take 1 capsule (100 mg total) by mouth at bedtime. 04/06/15  Yes Max T Hyatt, DPM  lisinopril (PRINIVIL,ZESTRIL) 10 MG tablet Take 10 mg by mouth daily.   Yes Historical Provider, MD  lovastatin (MEVACOR) 40 MG tablet Take 40 mg by mouth at bedtime.   Yes Historical Provider, MD  metFORMIN (GLUCOPHAGE) 1000 MG tablet Take 1,000 mg by mouth 2 (two) times daily with a meal.   Yes Historical Provider, MD  nitroGLYCERIN (NITROSTAT) 0.4 MG SL tablet Place 0.4 mg under the tongue every 5 (five) minutes as needed for chest pain.   Yes Historical Provider, MD  pantoprazole (PROTONIX) 20 MG tablet Take 20 mg by mouth daily.   Yes Historical Provider, MD  tamsulosin (FLOMAX) 0.4 MG CAPS capsule Take 0.4 mg by mouth 2 (two) times daily.   Yes Historical Provider, MD  warfarin (COUMADIN) 5 MG tablet Take 5 mg by mouth daily.   Yes Historical Provider, MD   Dg Chest 1 View  05/18/2015  CLINICAL DATA:  Larey Seat today at 0900 hours from a standing position. Fall occurred while doing balancing exercises. EXAM: CHEST 1 VIEW COMPARISON:  10/29/2014 FINDINGS: Previous median sternotomy and CABG. The heart remains mildly enlarged. Atherosclerosis of the aorta again seen. Pacemaker/ AICD inserted from the left appears  unchanged. The pulmonary vascularity is normal. The lungs are clear. No effusions. IMPRESSION: No change.  No active disease.  Previous CABG and pacemaker/ AICD. Electronically Signed   By: Paulina Fusi M.D.   On: 05/18/2015 15:50   Ct Head Wo Contrast  05/18/2015  CLINICAL DATA:  Fall. EXAM: CT HEAD WITHOUT CONTRAST TECHNIQUE: Contiguous axial images were obtained from the base of the skull through the vertex without intravenous contrast. COMPARISON:  06/22/2011 FINDINGS: Chronic bilateral basal ganglia lacunar infarcts noted. There is mild low attenuation throughout the subcortical  and periventricular white matter compatible with chronic microvascular disease. Prominence of the sulci and ventricles noted. No abnormal extra-axial fluid collections, intracranial hemorrhage or mass identified. No evidence for acute infarct. Partial opacification of the ethmoid air cells and sphenoid sinus noted. The mastoid air cells appear clear. The calvarium is intact. IMPRESSION: 1. No acute intracranial abnormalities. 2. Chronic microvascular disease and brain atrophy. 3. Ethmoid air cell and sphenoid sinus opacification. Electronically Signed   By: Signa Kell M.D.   On: 05/18/2015 17:02   Ct Hip Left Wo Contrast  05/18/2015  CLINICAL DATA:  Status post fall onto left hip, with left hip pain. Initial encounter. EXAM: CT OF THE LEFT HIP WITHOUT CONTRAST TECHNIQUE: Multidetector CT imaging of the left hip was performed according to the standard protocol. Multiplanar CT image reconstructions were also generated. COMPARISON:  Left hip radiographs performed earlier today at 3:26 p.m. FINDINGS: There is a minimally displaced subcapital fracture through the left femoral neck, with minimal comminution. The maximal head remains seated at the acetabulum. A trace associated joint effusion is noted. No additional fractures are seen. The pubic symphysis is unremarkable in appearance. Mild overlying soft tissue injury is noted at the left hip, without significant soft tissue hematoma. Scattered vascular calcifications are seen. The visualized portions of the bladder are unremarkable. The prostate is enlarged, measuring at least 5.3 cm in transverse dimension. IMPRESSION: 1. Minimally displaced subcapital fracture through the left femoral neck, with minimal comminution. Trace associated left hip joint effusion noted. 2. Mild overlying soft tissue injury, without significant soft tissue hematoma. 3. Scattered vascular calcifications seen. 4. Enlarged prostate noted. Electronically Signed   By: Roanna Raider M.D.    On: 05/18/2015 18:21   Dg Hip Unilat With Pelvis 2-3 Views Left  05/18/2015  CLINICAL DATA:  Larey Seat this morning with left hip pain EXAM: DG HIP (WITH OR WITHOUT PELVIS) 2-3V LEFT COMPARISON:  None. FINDINGS: Moderate bilateral hip arthritis. Pelvic bones appear to be intact. Although fracture line is not identified there is foreshortening of the left proximal femur with cortical irregularity involving the lateral femoral neck. IMPRESSION: A subtle left femoral neck fracture is suspected. CT left hip would be helpful to better evaluate anatomy. Electronically Signed   By: Esperanza Heir M.D.   On: 05/18/2015 15:50    Positive ROS: All other systems have been reviewed and were otherwise negative with the exception of those mentioned in the HPI and as above.  Physical Exam: General: Alert, no acute distress  MUSCULOSKELETAL: Left lower extremity: Patient's skin is intact overlying the left hip. He does not have significant shortening or external rotation. He has diminished sensation to light touch in both lower extremities due to diabetic peripheral neuropathy. He has palpable pedal pulses. He can flex and extend his toes and dorsiflex and plantarflex his ankle  both lower extremities. Patient has no significant swelling in his thigh and leg compartments are soft and compressible. There  is no erythema or ecchymosis seen over the left hip.  Assessment: Valgus impacted left femoral neck hip fracture  Plan: I explained to the patient and his family about his injury. I'm recommending percutaneous fixation with cannulated screws. I explained to the patient and his family what this surgery would entail along with the risks and benefits. They understand the risks include infection, bleeding, nerve or blood vessel injury, change in lower extremity rotation or leg length discrepancy, persistent pain in left hip, avascular necrosis, malunion, nonunion, hardware failure and the need for further surgery. Medical  risks include but are not limited to DVT and pulmonary embolism, myocardial infarction, stroke, pneumonia, respiratory failure and death. I answered all questions by the patient and his family.  Patient is on Coumadin fracture fibrillation. His INR is currently 2.6. I spoke with Dr. Harold HedgeKenneth Fath from cardiology this evening to discuss the menstruation of vitamin K. He agreed that it would be okay to give the patient 10 mg IV of vitamin K this evening in preparation for surgery. Patient is cleared for surgery by Dr. Enedina FinnerSona Patel. He is at moderate to high risk for hip surgery given his cardiac history. A CBC, BMP and INR will be rechecked in the morning. He currently scheduled for surgery at 10 AM, but this will depend on his INR tomorrow. The patient and his family understood and agreed with this plan. I have reviewed the patient's laboratories and radiographs studies in preparation for his surgery.    Riley Sanchez, Riley Newhall, MD    05/18/2015 6:39 PM

## 2015-05-18 NOTE — ED Provider Notes (Addendum)
[facility] Emergency Department Provider Note  ____________________________________________  Time seen: Seen upon arrival to the emergency department  I have reviewed the triage vital signs and the nursing notes.   HISTORY  Chief Complaint Fall and Hip Pain    HPI Riley Sanchez. is a 81 y.o. male with a history of diabetes, hypertension and cardiac disease who is presenting today after a fall onto his left hip. He said that about 9:00 this morning he was doing some balancing exercises with his wife when he lost balance and fell to his left onto his left hip. He said that it is been impossible to walk since even with a walker which she does not use it is baseline. He is also having a lot of pain with active range of motion to his left hip. He takes Plavix daily. He denies any headache or loss of consciousness. He says that he did hit his head on a cabinet while he was falling and it hurt briefly but pain is now resolved.   Past Medical History  Diagnosis Date  . Diabetes mellitus without complication (HCC)   . Hypertension     There are no active problems to display for this patient.   Past Surgical History  Procedure Laterality Date  . Cardiac surgery      bypass in 87  . Cardiac defibrillator placement      replaced 1/16, medtronic  . Cardiac defibrillator placement      medtronic 1/16    Current Outpatient Rx  Name  Route  Sig  Dispense  Refill  . aspirin EC 81 MG tablet   Oral   Take 81 mg by mouth daily.         . cholecalciferol (VITAMIN D) 1000 UNITS tablet   Oral   Take 1,000 Units by mouth daily.         . clopidogrel (PLAVIX) 75 MG tablet   Oral   Take 75 mg by mouth daily.         . diphenhydrAMINE (BENADRYL) 25 MG tablet   Oral   Take 25 mg by mouth at bedtime as needed for sleep.         Marland Kitchen gabapentin (NEURONTIN) 100 MG capsule   Oral   Take 1 capsule (100 mg total) by mouth at bedtime.   3 capsule   3   .  lisinopril (PRINIVIL,ZESTRIL) 10 MG tablet   Oral   Take 10 mg by mouth daily.         Marland Kitchen lovastatin (MEVACOR) 40 MG tablet   Oral   Take 40 mg by mouth at bedtime.         . metFORMIN (GLUCOPHAGE) 1000 MG tablet   Oral   Take 1,000 mg by mouth 2 (two) times daily with a meal.         . nitroGLYCERIN (NITROSTAT) 0.4 MG SL tablet   Sublingual   Place 0.4 mg under the tongue every 5 (five) minutes as needed for chest pain.         . pantoprazole (PROTONIX) 20 MG tablet   Oral   Take 20 mg by mouth daily.         . tamsulosin (FLOMAX) 0.4 MG CAPS capsule   Oral   Take 0.4 mg by mouth 2 (two) times daily.         Marland Kitchen warfarin (COUMADIN) 5 MG tablet   Oral   Take 5 mg by mouth daily.  Allergies Bactrim  No family history on file.  Social History Social History  Substance Use Topics  . Smoking status: Former Smoker  . Smokeless toGames developerbacco: None  . Alcohol Use: No    Review of Systems Constitutional: No fever/chills Eyes: No visual changes. ENT: No sore throat. Cardiovascular: Denies chest pain. Respiratory: Denies shortness of breath. Gastrointestinal: No abdominal pain.  No nausea, no vomiting.  No diarrhea.  No constipation. Genitourinary: Negative for dysuria. Musculoskeletal: Negative for back pain. Skin: Negative for rash. Neurological: Negative for focal weakness or numbness.  10-point ROS otherwise negative.  ____________________________________________   PHYSICAL EXAM:  VITAL SIGNS: ED Triage Vitals  Enc Vitals Group     BP 05/18/15 1500 158/70 mmHg     Pulse Rate 05/18/15 1500 90     Resp 05/18/15 1500 17     Temp 05/18/15 1500 97.7 F (36.5 C)     Temp Source 05/18/15 1500 Oral     SpO2 05/18/15 1500 98 %     Weight 05/18/15 1453 178 lb (80.74 kg)     Height 05/18/15 1453 5\' 11"  (1.803 m)     Head Cir --      Peak Flow --      Pain Score 05/18/15 1454 3     Pain Loc --      Pain Edu? --      Excl. in GC? --      Constitutional: Alert and oriented. Well appearing and in no acute distress. Eyes: Conjunctivae are normal. PERRL. EOMI. Head: Atraumatic. Nose: No congestion/rhinnorhea. Mouth/Throat: Mucous membranes are moist.  Oropharynx non-erythematous. Neck: No stridor.  No tenderness to the C-spine. Ranging neck freely without any discomfort. Cardiovascular: Normal rate, regular rhythm. Grossly normal heart sounds.  Good peripheral circulation. Respiratory: Normal respiratory effort.  No retractions. Lungs CTAB. Gastrointestinal: Soft and nontender. No distention. No abdominal bruits. No CVA tenderness. Musculoskeletal: No lower extremity edema. Pain to the left hip. Pelvis is stable. There is no deformity, shortening. There are bilateral dorsalis pedis pulses which are equal.  Neurologic:  Normal speech and language. Strength exam confounded by pain with range of motion to the left lower extremity. Skin:  Skin is warm, dry and intact. No rash noted. Psychiatric: Mood and affect are normal. Speech and behavior are normal.  ____________________________________________   LABS (all labs ordered are listed, but only abnormal results are displayed)  Labs Reviewed  CBC WITH DIFFERENTIAL/PLATELET - Abnormal; Notable for the following:    WBC 13.1 (*)    RBC 4.20 (*)    Hemoglobin 12.4 (*)    HCT 37.6 (*)    Neutro Abs 11.1 (*)    All other components within normal limits  PROTIME-INR - Abnormal; Notable for the following:    Prothrombin Time 27.5 (*)    All other components within normal limits  APTT - Abnormal; Notable for the following:    aPTT 43 (*)    All other components within normal limits  BASIC METABOLIC PANEL   ____________________________________________  EKG  ED ECG REPORT I, Schaevitz,  Teena Iraniavid M, the attending physician, personally viewed and interpreted this ECG.   Date: 05/18/2015  EKG Time: 1500  Rate: 95  Rhythm: Paced rhythm, wide complex  Intervals wide  complex  ST&T Change: No ST segment elevation or depression. T-wave inversions in 1 as well as aVL.  ____________________________________________  RADIOLOGY  No active disease on the chest x-ray. Subtle left femoral neck fracture suspected. ____________________________________________   PROCEDURES  ____________________________________________   INITIAL IMPRESSION / ASSESSMENT AND PLAN / ED COURSE  Pertinent labs & imaging results that were available during my care of the patient were reviewed by me and considered in my medical decision making (see chart for details).  ----------------------------------------- 4:29 PM on 05/18/2015 -----------------------------------------  Patient's INR returned at 2.6. We'll CAT scan the patient's brain. Now saying that he was taken off Plavix and now takes Coumadin. Still denies headache. Clinically with fractured left hip although extrarenal he says femoral neck fracture suspected. We'll also do a CT of the left hip. Signed out to Dr. Allena Katz of the medicine service. Patient family updated about the diagnosis and are willing to comply with the treatment plan for admission. ____________________________________________   FINAL CLINICAL IMPRESSION(S) / ED DIAGNOSES  Left hip fracture.    Myrna Blazer, MD 05/18/15 1630  Discussed the case also with Dr. Martha Clan of orthopedics will see the patient is a consult.  Myrna Blazer, MD 05/18/15 [phone]

## 2015-05-18 NOTE — ED Notes (Signed)
85 fell from standing 0900 this am, left hip pain, prog worse, causes 3/10, tries to move its worse.non weight bearing no deformity.  balance test with wife and he fell? cbg 114 130/74, low 50s hr,

## 2015-05-18 NOTE — ED Notes (Signed)
Patient transported to X-ray 

## 2015-05-18 NOTE — Progress Notes (Signed)
Pt continues to be nauseated.  Order given for once zofran. Diet ordered

## 2015-05-18 NOTE — ED Notes (Signed)
Pt returned from CT °

## 2015-05-18 NOTE — ED Notes (Signed)
Patient transported to CT 

## 2015-05-18 NOTE — Progress Notes (Signed)
Pt admitted to [facility] 143 from ED for hip fracture. Pt and family oriented to unit and room. Pt still experiencing nausea and vomiting upon admission. MD notified and 1xdose of IV Zofran ordered which seemed to cause some relief. Orders completed and skin assessed with Marchelle FolksAmanda, RN. Pt now resting comfortably in room with family at bedside. Will continue to assess.

## 2015-05-18 NOTE — H&P (Signed)
[facility] [place] [facility]   PATIENT NAME: Riley Sanchez    MR#:  [id number]  DATE OF BIRTH:  01/12/1930  DATE OF ADMISSION:  05/18/2015  PRIMARY CARE PHYSICIAN: Sula Rumple, MD   REQUESTING/REFERRING PHYSICIAN: dr Pershing Proud  CHIEF COMPLAINT:   Left hip pain after a mechanical fall today. HISTORY OF PRESENT ILLNESS:  Nassir Neidert  is a 81 y.o. male with a known history of hypertension, paroxysmal A. fib on warfarin which was recently started in October 2016 by Dr. Cassie Freer, cardiomyopathy ischemic, chronic systolic congestive heart failure, biventricular ICD for ischemic cardio myopathy with recent normal intervention consisted emergency room after he had a mechanical fall at home. Patient was doing some light balance exercises with his wife at home in the kitchen area. He apparently lost balance denies any dizziness or any syncopal episode. He fell landed on his left hip and was found to have left femoral neck fracture. Internal medicine was consulted for evaluation and management. She denies any chest pain shortness of breath. He is able to climb a flight of stairs without any chest pain or shortness of breath. He's had no anesthesia complications in the past. CT head is still pending.  PAST MEDICAL HISTORY:   Past Medical History  Diagnosis Date  . Diabetes mellitus without complication (HCC)   . Hypertension     PAST SURGICAL HISTOIRY:   Past Surgical History  Procedure Laterality Date  . Cardiac surgery      bypass in 87  . Cardiac defibrillator placement      replaced 1/16, medtronic  . Cardiac defibrillator placement      medtronic 1/16    SOCIAL HISTORY:   Social History  Substance Use Topics  . Smoking status: Former Games developer  . Smokeless tobacco: Not on file  . Alcohol Use: No    FAMILY HISTORY:  No family history on file.  DRUG ALLERGIES:   Allergies  Allergen Reactions  . Bactrim  [Sulfamethoxazole-Trimethoprim] Other (See Comments)    Reaction:  Unknown     REVIEW OF SYSTEMS:  Review of Systems  Constitutional: Negative for fever, chills and weight loss.  HENT: Negative for ear discharge, ear pain and nosebleeds.   Eyes: Negative for blurred vision, pain and discharge.  Respiratory: Negative for sputum production, shortness of breath, wheezing and stridor.   Cardiovascular: Negative for chest pain, palpitations, orthopnea and PND.  Gastrointestinal: Negative for nausea, vomiting, abdominal pain and diarrhea.  Genitourinary: Negative for urgency and frequency.  Musculoskeletal: Positive for joint pain and falls. Negative for back pain.  Neurological: Positive for weakness. Negative for sensory change, speech change and focal weakness.  Psychiatric/Behavioral: Negative for depression and hallucinations. The patient is not nervous/anxious.   All other systems reviewed and are negative.    MEDICATIONS AT HOME:   Prior to Admission medications   Medication Sig Start Date End Date Taking? Authorizing Provider  aspirin EC 81 MG tablet Take 81 mg by mouth daily.   Yes Historical Provider, MD  cholecalciferol (VITAMIN D) 1000 UNITS tablet Take 1,000 Units by mouth daily.   Yes Historical Provider, MD  diphenhydrAMINE (BENADRYL) 25 MG tablet Take 25 mg by mouth at bedtime as needed for sleep.   Yes Historical Provider, MD  gabapentin (NEURONTIN) 100 MG capsule Take 1 capsule (100 mg total) by mouth at bedtime. 04/06/15  Yes Max T Hyatt, DPM  lisinopril (PRINIVIL,ZESTRIL) 10 MG tablet Take 10 mg by mouth daily.   Yes Historical  Provider, MD  lovastatin (MEVACOR) 40 MG tablet Take 40 mg by mouth at bedtime.   Yes Historical Provider, MD  metFORMIN (GLUCOPHAGE) 1000 MG tablet Take 1,000 mg by mouth 2 (two) times daily with a meal.   Yes Historical Provider, MD  nitroGLYCERIN (NITROSTAT) 0.4 MG SL tablet Place 0.4 mg under the tongue every 5 (five) minutes as needed for chest  pain.   Yes Historical Provider, MD  pantoprazole (PROTONIX) 20 MG tablet Take 20 mg by mouth daily.   Yes Historical Provider, MD  tamsulosin (FLOMAX) 0.4 MG CAPS capsule Take 0.4 mg by mouth 2 (two) times daily.   Yes Historical Provider, MD  warfarin (COUMADIN) 5 MG tablet Take 5 mg by mouth daily.   Yes Historical Provider, MD      VITAL SIGNS:  Blood pressure 158/70, pulse 90, temperature 97.7 F (36.5 C), temperature source Oral, resp. rate 17, height 5\' 11"  (1.803 m), weight 80.74 kg (178 lb), SpO2 98 %.  PHYSICAL EXAMINATION:  GENERAL:  81 y.o.-year-old patient lying in the bed with no acute distress.  EYES: Pupils equal, round, reactive to light and accommodation. No scleral icterus. Extraocular muscles intact.  HEENT: Head atraumatic, normocephalic. Oropharynx and nasopharynx clear.  NECK:  Supple, no jugular venous distention. No thyroid enlargement, no tenderness.  LUNGS: Normal breath sounds bilaterally, no wheezing, rales,rhonchi or crepitation. No use of accessory muscles of respiration.  CARDIOVASCULAR: S1, S2 normal. No murmurs, rubs, or gallops.  ABDOMEN: Soft, nontender, nondistended. Bowel sounds present. No organomegaly or mass.  EXTREMITIES: No pedal edema, cyanosis, or clubbing. Left hip pain and decreased range of motion NEUROLOGIC: Cranial nerves II through XII are intact. Muscle strength 5/5 in all extremities. Sensation intact. Gait not checked.  PSYCHIATRIC: The patient is alert and oriented x 3.  SKIN: No obvious rash, lesion, or ulcer.   LABORATORY PANEL:   CBC  Recent Labs Lab 05/18/15 1550  WBC 13.1*  HGB 12.4*  HCT 37.6*  PLT 210   ------------------------------------------------------------------------------------------------------------------  Chemistries   Recent Labs Lab 05/18/15 1550  NA 138  K 4.7  CL 107  CO2 26  GLUCOSE 179*  BUN 15  CREATININE 0.86  CALCIUM 9.2    ------------------------------------------------------------------------------------------------------------------  Cardiac Enzymes No results for input(s): TROPONINI in the last 168 hours. ------------------------------------------------------------------------------------------------------------------  RADIOLOGY:  Dg Chest 1 View  05/18/2015  CLINICAL DATA:  Larey SeatFell today at 0900 hours from a standing position. Fall occurred while doing balancing exercises. EXAM: CHEST 1 VIEW COMPARISON:  10/29/2014 FINDINGS: Previous median sternotomy and CABG. The heart remains mildly enlarged. Atherosclerosis of the aorta again seen. Pacemaker/ AICD inserted from the left appears unchanged. The pulmonary vascularity is normal. The lungs are clear. No effusions. IMPRESSION: No change.  No active disease.  Previous CABG and pacemaker/ AICD. Electronically Signed   By: Paulina FusiMark  Shogry M.D.   On: 05/18/2015 15:50   Dg Hip Unilat With Pelvis 2-3 Views Left  05/18/2015  CLINICAL DATA:  Larey SeatFell this morning with left hip pain EXAM: DG HIP (WITH OR WITHOUT PELVIS) 2-3V LEFT COMPARISON:  None. FINDINGS: Moderate bilateral hip arthritis. Pelvic bones appear to be intact. Although fracture line is not identified there is foreshortening of the left proximal femur with cortical irregularity involving the lateral femoral neck. IMPRESSION: A subtle left femoral neck fracture is suspected. CT left hip would be helpful to better evaluate anatomy. Electronically Signed   By: Esperanza Heiraymond  Rubner M.D.   On: 05/18/2015 15:50    EKG:  Regular rhythm, paced IMPRESSION AND PLAN:   Dreshaun Stene  is a 81 y.o. male with a known history of hypertension, paroxysmal A. fib on warfarin which was recently started in October 2016 by Dr. Cassie Freer, cardiomyopathy ischemic, chronic systolic congestive heart failure, biventricular ICD for ischemic cardio myopathy with recent normal intervention consisted emergency room after he had a mechanical  fall at home  1. Left femoral neck fracture status post mechanical fall at home. Admit to orthopedic floor IV fluids Orthopedic consultation with Dr. Kirtland Bouchard Hold off Coumadin. No indication for acute reversal. INR is 2.6. We will allow INR to drift down on its own. If INR rises we'll consider giving small dose of vitamin K. Check PT/INR in the morning When necessary pain meds Patient is at intermediate to high risk for surgery given his chronic multiple cardiac medical history. Currently does not have any active cardiac problem. Patient is okay for surgery. He and his wife understands the risk and benefits of surgery.  2. Cardiomyopathy, ischemic stable  3. Hypertension continue home meds  4. Diabetes2: Off on by mouth Meds Sliding scale insulin  5. DVT prophylaxis Patient's INR is 2.6 on hold off on any Lovenox at present.    All the records are reviewed and case discussed with ED provider. Management plans discussed with the patient, family and they are in agreement.  CODE STATUS: Full  TOTAL TIME TAKING CARE OF THIS PATIENT: 45 minutes.    Davon Folta M.D on 05/18/2015 at 4:41 PM  Between 7am to 6pm - Pager - [phone]  After 6pm go to [email or web address] - password [email or web address]  [facility]  Office  [phone]  CC: Primary care physician; Sula Rumple, MD

## 2015-05-19 ENCOUNTER — Encounter: Payer: Self-pay | Admitting: Anesthesiology

## 2015-05-19 ENCOUNTER — Inpatient Hospital Stay: Payer: Medicare Other

## 2015-05-19 ENCOUNTER — Inpatient Hospital Stay: Payer: Medicare Other | Admitting: Certified Registered Nurse Anesthetist

## 2015-05-19 ENCOUNTER — Encounter
Admission: EM | Disposition: A | Discharge status: Skilled Nursing Facility | Payer: Self-pay | Source: Home / Self Care | Attending: Internal Medicine

## 2015-05-19 HISTORY — PX: HIP PINNING,CANNULATED: SHX1758

## 2015-05-19 LAB — URINALYSIS COMPLETE WITH MICROSCOPIC (ARMC ONLY)
BACTERIA UA: NONE SEEN
Bilirubin Urine: NEGATIVE
Glucose, UA: NEGATIVE mg/dL
Ketones, ur: NEGATIVE mg/dL
LEUKOCYTES UA: NEGATIVE
NITRITE: NEGATIVE
PH: 7 (ref 5.0–8.0)
Protein, ur: NEGATIVE mg/dL
Specific Gravity, Urine: 1.017 (ref 1.005–1.030)
Squamous Epithelial / LPF: NONE SEEN

## 2015-05-19 LAB — PROTIME-INR
INR: 1.69
INR: 1.58
PROTHROMBIN TIME: 19.9 s — AB (ref 11.4–15.0)
PROTHROMBIN TIME: 18.9 s — AB (ref 11.4–15.0)

## 2015-05-19 LAB — GLUCOSE, CAPILLARY
GLUCOSE-CAPILLARY: 114 mg/dL — AB (ref 65–99)
Glucose-Capillary: 129 mg/dL — ABNORMAL HIGH (ref 65–99)

## 2015-05-19 SURGERY — FIXATION, FEMUR, NECK, PERCUTANEOUS, USING SCREW
Anesthesia: General | Site: Hip | Laterality: Left | Wound class: Clean

## 2015-05-19 MED ORDER — CEFAZOLIN SODIUM-DEXTROSE 2-3 GM-% IV SOLR
2.0000 g | Freq: Four times a day (QID) | INTRAVENOUS | Status: AC
  Administered 2015-05-19 (×2): 2 g via INTRAVENOUS
  Filled 2015-05-19 (×2): qty 50

## 2015-05-19 MED ORDER — FENTANYL CITRATE (PF) 100 MCG/2ML IJ SOLN
INTRAMUSCULAR | Status: DC | PRN
  Administered 2015-05-19: 25 ug via INTRAVENOUS
  Administered 2015-05-19: 50 ug via INTRAVENOUS
  Administered 2015-05-19: 25 ug via INTRAVENOUS

## 2015-05-19 MED ORDER — ONDANSETRON HCL 4 MG/2ML IJ SOLN
4.0000 mg | Freq: Once | INTRAMUSCULAR | Status: DC | PRN
  Filled 2015-05-19: qty 2

## 2015-05-19 MED ORDER — FERROUS SULFATE 325 (65 FE) MG PO TABS
325.0000 mg | ORAL_TABLET | Freq: Three times a day (TID) | ORAL | Status: DC
  Administered 2015-05-19 – 2015-05-22 (×7): 325 mg via ORAL
  Filled 2015-05-19 (×7): qty 1

## 2015-05-19 MED ORDER — ASPIRIN EC 81 MG PO TBEC
81.0000 mg | DELAYED_RELEASE_TABLET | Freq: Every day | ORAL | Status: DC
  Administered 2015-05-19 – 2015-05-22 (×4): 81 mg via ORAL
  Filled 2015-05-19 (×4): qty 1

## 2015-05-19 MED ORDER — OXYCODONE HCL 5 MG PO TABS
5.0000 mg | ORAL_TABLET | ORAL | Status: DC | PRN
  Administered 2015-05-19 – 2015-05-21 (×5): 5 mg via ORAL
  Filled 2015-05-19 (×5): qty 1

## 2015-05-19 MED ORDER — NEOMYCIN-POLYMYXIN B GU 40-200000 IR SOLN
Status: DC | PRN
  Administered 2015-05-19: 4 mL

## 2015-05-19 MED ORDER — METFORMIN HCL 500 MG PO TABS
1000.0000 mg | ORAL_TABLET | Freq: Two times a day (BID) | ORAL | Status: DC
  Administered 2015-05-19 – 2015-05-22 (×6): 1000 mg via ORAL
  Filled 2015-05-19 (×6): qty 2

## 2015-05-19 MED ORDER — MORPHINE SULFATE (PF) 2 MG/ML IV SOLN: 2.0000 mg | INTRAVENOUS | Status: DC | PRN

## 2015-05-19 MED ORDER — BUPIVACAINE-EPINEPHRINE (PF) 0.25% -1:200000 IJ SOLN
INTRAMUSCULAR | Status: DC | PRN
  Administered 2015-05-19: 30 mL via PERINEURAL

## 2015-05-19 MED ORDER — ONDANSETRON HCL 4 MG PO TABS: 4.0000 mg | ORAL_TABLET | Freq: Four times a day (QID) | ORAL | Status: DC | PRN

## 2015-05-19 MED ORDER — SODIUM CHLORIDE 0.9 % IV SOLN
75.0000 mL/h | INTRAVENOUS | Status: DC
  Administered 2015-05-19 (×2): 75 mL/h via INTRAVENOUS

## 2015-05-19 MED ORDER — WARFARIN SODIUM 5 MG PO TABS
5.0000 mg | ORAL_TABLET | Freq: Every day | ORAL | Status: DC
  Administered 2015-05-19: 5 mg via ORAL
  Filled 2015-05-19: qty 1

## 2015-05-19 MED ORDER — PROPOFOL 10 MG/ML IV BOLUS
INTRAVENOUS | Status: DC | PRN
  Administered 2015-05-19: 100 mg via INTRAVENOUS

## 2015-05-19 MED ORDER — POLYETHYLENE GLYCOL 3350 17 G PO PACK
17.0000 g | PACK | Freq: Every day | ORAL | Status: DC | PRN
  Administered 2015-05-21 – 2015-05-22 (×2): 17 g via ORAL
  Filled 2015-05-19 (×2): qty 1

## 2015-05-19 MED ORDER — MAGNESIUM CITRATE PO SOLN
1.0000 | Freq: Once | ORAL | Status: DC | PRN
  Filled 2015-05-19: qty 296

## 2015-05-19 MED ORDER — NEOSTIGMINE METHYLSULFATE 10 MG/10ML IV SOLN
INTRAVENOUS | Status: DC | PRN
  Administered 2015-05-19: 1 mg via INTRAVENOUS

## 2015-05-19 MED ORDER — PHENYLEPHRINE HCL 10 MG/ML IJ SOLN
INTRAMUSCULAR | Status: DC | PRN
  Administered 2015-05-19 (×3): 100 ug via INTRAVENOUS
  Administered 2015-05-19: 50 ug via INTRAVENOUS
  Administered 2015-05-19: 100 ug via INTRAVENOUS
  Administered 2015-05-19 (×2): 50 ug via INTRAVENOUS

## 2015-05-19 MED ORDER — DOCUSATE SODIUM 100 MG PO CAPS
100.0000 mg | ORAL_CAPSULE | Freq: Two times a day (BID) | ORAL | Status: DC
  Administered 2015-05-19 – 2015-05-22 (×7): 100 mg via ORAL
  Filled 2015-05-19 (×7): qty 1

## 2015-05-19 MED ORDER — CEFAZOLIN SODIUM-DEXTROSE 2-3 GM-% IV SOLR
INTRAVENOUS | Status: DC | PRN
  Administered 2015-05-19: 2 g via INTRAVENOUS

## 2015-05-19 MED ORDER — GLYCOPYRROLATE 0.2 MG/ML IJ SOLN
INTRAMUSCULAR | Status: DC | PRN
  Administered 2015-05-19: 0.2 mg via INTRAVENOUS

## 2015-05-19 MED ORDER — ACETAMINOPHEN 650 MG RE SUPP: 650.0000 mg | Freq: Four times a day (QID) | RECTAL | Status: DC | PRN

## 2015-05-19 MED ORDER — ROCURONIUM BROMIDE 100 MG/10ML IV SOLN
INTRAVENOUS | Status: DC | PRN
  Administered 2015-05-19: 20 mg via INTRAVENOUS

## 2015-05-19 MED ORDER — ACETAMINOPHEN 325 MG PO TABS
650.0000 mg | ORAL_TABLET | Freq: Four times a day (QID) | ORAL | Status: DC | PRN
  Administered 2015-05-19 – 2015-05-22 (×4): 650 mg via ORAL
  Filled 2015-05-19 (×4): qty 2

## 2015-05-19 MED ORDER — LACTATED RINGERS IV SOLN
INTRAVENOUS | Status: DC | PRN
  Administered 2015-05-19: 11:00:00 via INTRAVENOUS

## 2015-05-19 MED ORDER — ALUM & MAG HYDROXIDE-SIMETH 200-200-20 MG/5ML PO SUSP: 30.0000 mL | ORAL | Status: DC | PRN

## 2015-05-19 MED ORDER — BISACODYL 10 MG RE SUPP
10.0000 mg | Freq: Every day | RECTAL | Status: DC | PRN
  Administered 2015-05-21: 10 mg via RECTAL
  Filled 2015-05-19: qty 1

## 2015-05-19 MED ORDER — SUCCINYLCHOLINE CHLORIDE 20 MG/ML IJ SOLN
INTRAMUSCULAR | Status: DC | PRN
  Administered 2015-05-19: 100 mg via INTRAVENOUS

## 2015-05-19 MED ORDER — MENTHOL 3 MG MT LOZG
1.0000 | LOZENGE | OROMUCOSAL | Status: DC | PRN
  Filled 2015-05-19: qty 9

## 2015-05-19 MED ORDER — ONDANSETRON HCL 4 MG/2ML IJ SOLN
4.0000 mg | Freq: Four times a day (QID) | INTRAMUSCULAR | Status: DC | PRN
  Administered 2015-05-21: 4 mg via INTRAVENOUS
  Filled 2015-05-19: qty 2

## 2015-05-19 MED ORDER — PHENOL 1.4 % MT LIQD
1.0000 | OROMUCOSAL | Status: DC | PRN
  Filled 2015-05-19: qty 177

## 2015-05-19 MED ORDER — FENTANYL CITRATE (PF) 100 MCG/2ML IJ SOLN
25.0000 ug | INTRAMUSCULAR | Status: DC | PRN
  Administered 2015-05-19: 25 ug via INTRAVENOUS

## 2015-05-19 SURGICAL SUPPLY — 34 items
BIT DRILL CANN LRG QC 5X300 (BIT) ×3 IMPLANT
BLADE SURG SZ11 CARB STEEL (BLADE) ×3 IMPLANT
BNDG COHESIVE 6X5 TAN STRL LF (GAUZE/BANDAGES/DRESSINGS) ×3 IMPLANT
CANISTER SUCT 1200ML W/VALVE (MISCELLANEOUS) ×3 IMPLANT
CATH TRAY 16F METER LATEX (MISCELLANEOUS) IMPLANT
CORD BIP STRL DISP 12FT (MISCELLANEOUS) ×3 IMPLANT
DRAPE SURG 17X11 SM STRL (DRAPES) ×6 IMPLANT
DRAPE U-SHAPE 47X51 STRL (DRAPES) ×3 IMPLANT
DRSG OPSITE POSTOP 3X4 (GAUZE/BANDAGES/DRESSINGS) IMPLANT
DRSG OPSITE POSTOP 4X6 (GAUZE/BANDAGES/DRESSINGS) IMPLANT
DURAPREP 26ML APPLICATOR (WOUND CARE) ×6 IMPLANT
FORCEPS JEWEL BIP 4-3/4 STR (INSTRUMENTS) ×3 IMPLANT
GAUZE SPONGE 4X4 12PLY STRL (GAUZE/BANDAGES/DRESSINGS) ×3 IMPLANT
GLOVE BIOGEL PI IND STRL 9 (GLOVE) ×1 IMPLANT
GLOVE BIOGEL PI INDICATOR 9 (GLOVE) ×2
GLOVE SURG 9.0 ORTHO LTXF (GLOVE) ×6 IMPLANT
GOWN STRL REUS TWL 2XL XL LVL4 (GOWN DISPOSABLE) ×3 IMPLANT
GOWN STRL REUS W/ TWL LRG LVL3 (GOWN DISPOSABLE) ×1 IMPLANT
GOWN STRL REUS W/TWL LRG LVL3 (GOWN DISPOSABLE) ×2
GUIDEWIRE THREADED 2.8 (WIRE) ×9 IMPLANT
HOLDER FOLEY CATH W/STRAP (MISCELLANEOUS) IMPLANT
MAT BLUE FLOOR 46X72 FLO (MISCELLANEOUS) ×3 IMPLANT
NS IRRIG 1000ML POUR BTL (IV SOLUTION) ×3 IMPLANT
PACK HIP COMPR (MISCELLANEOUS) ×3 IMPLANT
PAD GROUND ADULT SPLIT (MISCELLANEOUS) ×3 IMPLANT
SCREW CANN 32 THRD/100 7.3 (Screw) ×3 IMPLANT
SCREW CANN 32 THRD/105 7.3 (Screw) ×3 IMPLANT
SCREW CANN 32 THRD/90 7.3 (Screw) ×3 IMPLANT
SCREW CANN 32 THRD/95 7.3 (Screw) IMPLANT
STAPLER SKIN PROX 35W (STAPLE) ×3 IMPLANT
STRAP SAFETY BODY (MISCELLANEOUS) ×3 IMPLANT
SUT VIC AB 0 CT1 36 (SUTURE) ×3 IMPLANT
SUT VIC AB 2-0 CT2 27 (SUTURE) ×3 IMPLANT
SUT VICRYL 0 AB UR-6 (SUTURE) ×6 IMPLANT

## 2015-05-19 NOTE — Progress Notes (Signed)
Plan of care discussed with patient. Pain controlled with PRN medication. Patient demonstrates correct use of incentive spirometer. Plan to dangle and stand patient. Nursing will continue to monitor.

## 2015-05-19 NOTE — Care Management Note (Signed)
Case Management Note  Patient Details  Name: Riley Sanchez. MRN: [id number] Date of Birth: [birth year]  Subjective/Objective:                  Met with patient and his wife of 54 years as he was waiting to be transferred to OR for surgery today. He is from home where he has been independent with mobility. However, he has a rolling walker . He uses [facility] in [place] for Rx [phone]. Home health RN would be beneficial for PT/INRs if patient is able to return home at discharge. He has never required home health services.   Action/Plan: List of home health agencies along with my contact card was shared with patient's wife. RNCM will continue to follow PT recommendation.   Expected Discharge Date:                  Expected Discharge Plan:     In-House Referral:  Clinical Social Work  Discharge planning Services  CM Consult  Post Acute Care Choice:  Durable Medical Equipment, Home Health Choice offered to:  Patient, Spouse  DME Arranged:    DME Agency:     HH Arranged:    [place] Agency:     Status of Service:  In process, will continue to follow  Medicare Important Message Given:    Date Medicare IM Given:    Medicare IM give by:    Date Additional Medicare IM Given:    Additional Medicare Important Message give by:     If discussed at [place] of Stay Meetings, dates discussed:    Additional Comments:  Marshell Garfinkel, RN 05/19/2015, 11:17 AM

## 2015-05-19 NOTE — Progress Notes (Signed)
Patient up with 2 assist. Patient dangled along side of bed. Patient tolerates well. Pain controlled with PRN medication and ice pack.

## 2015-05-19 NOTE — Progress Notes (Signed)
Dr Martha ClanKrasinski and Dr Luberta MutterKonidena aware of pt lab values

## 2015-05-19 NOTE — Progress Notes (Signed)
PT Cancellation Note  Patient Details Name: Riley BimlerMilton Brandvold Jr. MRN: [id number] DOB: Jul 31, 1929   Cancelled Treatment:    Reason Eval/Treat Not Completed: Patient not medically ready (Per notes, pt pending OR for L femoral neck hip fx.)  Will discontinue current PT order (need new orders for PT post-op).  Please re-consult PT post-op when pt is medically appropriate to participate in PT.   Riley Sanchez 05/19/2015, 8:24 AM Riley Sanchez, PT [phone]

## 2015-05-19 NOTE — Progress Notes (Signed)
Subjective:  POST-OP CHECK:  Patient is evaluated in his hospital room postoperatively. He is resting comfortably. He had nausea immediately upon arrival to the floor but this has subsided. His pain is controlled as long as he doesn't move his left hip.  Patient reports pain as mild.    Objective:   VITALS:   Filed Vitals:   05/19/15 1343 05/19/15 1352 05/19/15 1417 05/19/15 1448  BP:  131/54 137/63 124/52  Pulse: 76 88 79 69  Temp: 98.8 F (37.1 C)  100 F (37.8 C) 98.2 F (36.8 C)  TempSrc:   Axillary Oral  Resp: Height:      Weight:      SpO2: 96% 99% 97% 97%    PHYSICAL EXAM:  Left hip/lower extremity:  Patient's left hip dressing is clean dry and intact. He has no erythema or ecchymosis or swelling. His thigh compartment is soft and compressible. He has has diminished sensation light touch in both lower extremities secondary to peripheral neuropathy which is consistent with his preoperative exam. He can flex and extend his toes and dorsiflex and plantarflex his left ankle.  LABS  Results for orders placed or performed during the hospital encounter of 05/18/15 (from the past 24 hour(s))  CBC with Differential     Status: Abnormal   Collection Time: 05/18/15  3:50 PM  Result Value Ref Range   WBC 13.1 (H) 3.8 - 10.6 K/uL   RBC 4.20 (L) 4.40 - 5.90 MIL/uL   Hemoglobin 12.4 (L) 13.0 - 18.0 g/dL   HCT 16.1 (L) 09.6 - 04.5 %   MCV 89.5 80.0 - 100.0 fL   MCH 29.5 26.0 - 34.0 pg   MCHC 33.0 32.0 - 36.0 g/dL   RDW 40.9 81.1 - 91.4 %   Platelets 210 150 - 440 K/uL   Neutrophils Relative % 85 %   Neutro Abs 11.1 (H) 1.4 - 6.5 K/uL   Lymphocytes Relative 10 %   Lymphs Abs 1.3 1.0 - 3.6 K/uL   Monocytes Relative 4 %   Monocytes Absolute 0.6 0.2 - 1.0 K/uL   Eosinophils Relative 1 %   Eosinophils Absolute 0.1 0 - 0.7 K/uL   Basophils Relative 0 %   Basophils Absolute 0.0 0 - 0.1 K/uL  Basic metabolic panel     Status: Abnormal   Collection Time: 05/18/15   3:50 PM  Result Value Ref Range   Sodium 138 135 - 145 mmol/L   Potassium 4.7 3.5 - 5.1 mmol/L   Chloride 107 101 - 111 mmol/L   CO2 26 22 - 32 mmol/L   Glucose, Bld 179 (H) 65 - 99 mg/dL   BUN 15 6 - 20 mg/dL   Creatinine, Ser 7.82 0.61 - 1.24 mg/dL   Calcium 9.2 8.9 - 95.6 mg/dL   GFR calc non Af Amer >60 >60 mL/min   GFR calc Af Amer >60 >60 mL/min   Anion gap 5 5 - 15  Protime-INR     Status: Abnormal   Collection Time: 05/18/15  3:50 PM  Result Value Ref Range   Prothrombin Time 27.5 (H) 11.4 - 15.0 seconds   INR 2.60   APTT     Status: Abnormal   Collection Time: 05/18/15  3:50 PM  Result Value Ref Range   aPTT 43 (H) 24 - 36 seconds  Urine culture     Status: None (Preliminary result)   Collection Time: 05/18/15  5:50 PM  Result Value Ref  Range   Specimen Description URINE, CLEAN CATCH    Special Requests NONE    Culture NO GROWTH < 24 HOURS    Report Status PENDING   MRSA PCR Screening     Status: None   Collection Time: 05/18/15  5:51 PM  Result Value Ref Range   MRSA by PCR NEGATIVE NEGATIVE  Albumin     Status: None   Collection Time: 05/18/15  5:58 PM  Result Value Ref Range   Albumin 4.2 3.5 - 5.0 g/dL  Type and screen     Status: None   Collection Time: 05/18/15  7:28 PM  Result Value Ref Range   ABO/RH(D) A POS    Antibody Screen NEG    Sample Expiration 05/21/2015   ABO/Rh     Status: None   Collection Time: 05/18/15  7:29 PM  Result Value Ref Range   ABO/RH(D) A POS   Glucose, capillary     Status: Abnormal   Collection Time: 05/18/15  9:40 PM  Result Value Ref Range   Glucose-Capillary 196 (H) 65 - 99 mg/dL  Protime-INR     Status: Abnormal   Collection Time: 05/19/15  6:10 AM  Result Value Ref Range   Prothrombin Time 19.9 (H) 11.4 - 15.0 seconds   INR 1.69   Urinalysis complete, with microscopic ([facility] only)     Status: Abnormal   Collection Time: 05/19/15  7:30 AM  Result Value Ref Range   Color, Urine YELLOW (A) YELLOW   APPearance  CLEAR (A) CLEAR   Glucose, UA NEGATIVE NEGATIVE mg/dL   Bilirubin Urine NEGATIVE NEGATIVE   Ketones, ur NEGATIVE NEGATIVE mg/dL   Specific Gravity, Urine 1.017 1.005 - 1.030   Hgb urine dipstick 2+ (A) NEGATIVE   pH 7.0 5.0 - 8.0   Protein, ur NEGATIVE NEGATIVE mg/dL   Nitrite NEGATIVE NEGATIVE   Leukocytes, UA NEGATIVE NEGATIVE   RBC / HPF 6-30 0 - 5 RBC/hpf   WBC, UA 6-30 0 - 5 WBC/hpf   Bacteria, UA NONE SEEN NONE SEEN   Squamous Epithelial / LPF NONE SEEN NONE SEEN  Protime-INR     Status: Abnormal   Collection Time: 05/19/15  8:43 AM  Result Value Ref Range   Prothrombin Time 18.9 (H) 11.4 - 15.0 seconds   INR 1.58   Glucose, capillary     Status: Abnormal   Collection Time: 05/19/15  8:52 AM  Result Value Ref Range   Glucose-Capillary 129 (H) 65 - 99 mg/dL    Dg Chest 1 View  78/29/562111/14/2016  CLINICAL DATA:  Larey SeatFell today at 0900 hours from a standing position. Fall occurred while doing balancing exercises. EXAM: CHEST 1 VIEW COMPARISON:  10/29/2014 FINDINGS: Previous median sternotomy and CABG. The heart remains mildly enlarged. Atherosclerosis of the aorta again seen. Pacemaker/ AICD inserted from the left appears unchanged. The pulmonary vascularity is normal. The lungs are clear. No effusions. IMPRESSION: No change.  No active disease.  Previous CABG and pacemaker/ AICD. Electronically Signed   By: Paulina FusiMark  Shogry M.D.   On: 05/18/2015 15:50   Ct Head Wo Contrast  05/18/2015  CLINICAL DATA:  Fall. EXAM: CT HEAD WITHOUT CONTRAST TECHNIQUE: Contiguous axial images were obtained from the base of the skull through the vertex without intravenous contrast. COMPARISON:  06/22/2011 FINDINGS: Chronic bilateral basal ganglia lacunar infarcts noted. There is mild low attenuation throughout the subcortical and periventricular white matter compatible with chronic microvascular disease. Prominence of the sulci and ventricles noted. No  abnormal extra-axial fluid collections, intracranial hemorrhage or  mass identified. No evidence for acute infarct. Partial opacification of the ethmoid air cells and sphenoid sinus noted. The mastoid air cells appear clear. The calvarium is intact. IMPRESSION: 1. No acute intracranial abnormalities. 2. Chronic microvascular disease and brain atrophy. 3. Ethmoid air cell and sphenoid sinus opacification. Electronically Signed   By: Signa Kell M.D.   On: 05/18/2015 17:02   Ct Hip Left Wo Contrast  05/18/2015  CLINICAL DATA:  Status post fall onto left hip, with left hip pain. Initial encounter. EXAM: CT OF THE LEFT HIP WITHOUT CONTRAST TECHNIQUE: Multidetector CT imaging of the left hip was performed according to the standard protocol. Multiplanar CT image reconstructions were also generated. COMPARISON:  Left hip radiographs performed earlier today at 3:26 p.m. FINDINGS: There is a minimally displaced subcapital fracture through the left femoral neck, with minimal comminution. The maximal head remains seated at the acetabulum. A trace associated joint effusion is noted. No additional fractures are seen. The pubic symphysis is unremarkable in appearance. Mild overlying soft tissue injury is noted at the left hip, without significant soft tissue hematoma. Scattered vascular calcifications are seen. The visualized portions of the bladder are unremarkable. The prostate is enlarged, measuring at least 5.3 cm in transverse dimension. IMPRESSION: 1. Minimally displaced subcapital fracture through the left femoral neck, with minimal comminution. Trace associated left hip joint effusion noted. 2. Mild overlying soft tissue injury, without significant soft tissue hematoma. 3. Scattered vascular calcifications seen. 4. Enlarged prostate noted. Electronically Signed   By: Roanna Raider M.D.   On: 05/18/2015 18:21   Dg Hip Port Unilat With Pelvis 1v Left  05/19/2015  CLINICAL DATA:  ORIF traumatic subcapital left femoral neck fracture. EXAM: DG HIP (WITH OR WITHOUT PELVIS) 1V PORT  LEFT COMPARISON:  Preoperative x-rays and CT yesterday. FINDINGS: ORIF of the subcapital left femoral neck fracture with 3 cannulated screws. Alignment appears anatomic. No acute complicating features. Moderate symmetric joint space narrowing in both hips again noted. IMPRESSION: Anatomic alignment post ORIF of the subcapital left femoral neck fracture without acute complicating features. Electronically Signed   By: Hulan Saas M.D.   On: 05/19/2015 13:20   Dg Hip Unilat With Pelvis 2-3 Views Left  05/18/2015  CLINICAL DATA:  Larey Seat this morning with left hip pain EXAM: DG HIP (WITH OR WITHOUT PELVIS) 2-3V LEFT COMPARISON:  None. FINDINGS: Moderate bilateral hip arthritis. Pelvic bones appear to be intact. Although fracture line is not identified there is foreshortening of the left proximal femur with cortical irregularity involving the lateral femoral neck. IMPRESSION: A subtle left femoral neck fracture is suspected. CT left hip would be helpful to better evaluate anatomy. Electronically Signed   By: Esperanza Heir M.D.   On: 05/18/2015 15:50    Assessment/Plan: Day of Surgery   Active Problems:   Hip fracture [facility])  The patient is stable postop. His pain is currently controlled. He will complete 24 hours of postop antibiotics.  He'll be encouraged to use his incentive spirometer while awake. His Coumadin will be restarted tonight. Labs, including an INR, will be rechecked in the a.m.   Juanell Fairly , MD 05/19/2015, 3:22 PM

## 2015-05-19 NOTE — Anesthesia Preprocedure Evaluation (Addendum)
Anesthesia Evaluation  Patient identified by MRN, date of birth, ID band Patient awake    Reviewed: Allergy & Precautions, NPO status , Patient's Chart, lab work & pertinent test results, reviewed documented beta blocker date and time   Airway Mallampati: III  TM Distance: >3 FB     Dental  (+) Chipped, Upper Dentures, Poor Dentition, Missing, Dental Advisory Given   Pulmonary former smoker,           Cardiovascular hypertension, Pt. on medications + Cardiac Defibrillator      Neuro/Psych    GI/Hepatic   Endo/Other  diabetes, Type 2  Renal/GU      Musculoskeletal   Abdominal   Peds  Hematology   Anesthesia Other Findings Seen by Parashos 1 month ago. OK.H/O A fib  CABG in 1987. No stents. Inc PT. 1.58.  Reproductive/Obstetrics                           Anesthesia Physical Anesthesia Plan  ASA: III  Anesthesia Plan: General   Post-op Pain Management:    Induction: Intravenous  Airway Management Planned: Oral ETT  Additional Equipment:   Intra-op Plan:   Post-operative Plan:   Informed Consent: I have reviewed the patients History and Physical, chart, labs and discussed the procedure including the risks, benefits and alternatives for the proposed anesthesia with the patient or authorized representative who has indicated his/her understanding and acceptance.     Plan Discussed with: CRNA  Anesthesia Plan Comments:         Anesthesia Quick Evaluation

## 2015-05-19 NOTE — Plan of Care (Signed)
Problem: Pain Management: Goal: Pain level will decrease Outcome: Progressing Pain control with iv pain medication due to pt npo status

## 2015-05-19 NOTE — Progress Notes (Signed)
[facility] [place] [facility]   PATIENT NAME: Riley Sanchez    MR#:  [id number]  DATE OF BIRTH:  01-Jul-1930  SUBJECTIVE: Admitted for fall, left hip fracture. INR was more than now 2.5 on admission patient received 10 mg of vitamin K. Today it is 1.6. Has a terrible pain in the hip. Scheduled for hip surgery.  CHIEF COMPLAINT:   Chief Complaint  Patient presents with  . Fall  . Hip Pain    REVIEW OF SYSTEMS:    Review of Systems  Constitutional: Negative for fever.  Eyes: Negative for blurred vision.  Respiratory: Negative for cough.   Cardiovascular: Negative for chest pain.  Gastrointestinal: Negative for heartburn.  Genitourinary: Positive for dysuria.  Musculoskeletal: Positive for myalgias.       Left hip pain  Neurological: Negative for dizziness, speech change and headaches.  Endo/Heme/Allergies: Does not bruise/bleed easily.    Nutrition:  Tolerating Diet: Tolerating PT:      DRUG ALLERGIES:   Allergies  Allergen Reactions  . Bactrim [Sulfamethoxazole-Trimethoprim] Other (See Comments)    Reaction:  Unknown     VITALS:  Blood pressure 145/60, pulse 78, temperature 99.7 F (37.6 C), temperature source Oral, resp. rate 15, height  (1.803 m), weight 80.74 kg (178 lb), SpO2 97 %.  PHYSICAL EXAMINATION:   Physical Exam  GENERAL:  81 y.o.-year-old patient lying in the bed with no acute distress.  EYES: Pupils equal, round, reactive to light and accommodation. No scleral icterus. Extraocular muscles intact.  HEENT: Head atraumatic, normocephalic. Oropharynx and nasopharynx clear.  NECK:  Supple, no jugular venous distention. No thyroid enlargement, no tenderness.  LUNGS: Normal breath sounds bilaterally, no wheezing, rales,rhonchi or crepitation. No use of accessory muscles of respiration.  CARDIOVASCULAR: S1, S2 normal. No murmurs, rubs, or gallops.  ABDOMEN: Soft, nontender, nondistended. Bowel sounds present. No  organomegaly or mass.  EXTREMITIES: Severe left thigh pain. Sensations in the legs secondary to diabetic neuropathy. Peripheral  pulses are intact. NEUROLOGIC: Cranial nerves II through XII are intact. Muscle strength 5/5 in all extremities. Sensation intact. Gait not checked.  PSYCHIATRIC: The patient is alert and oriented x 3.  SKIN: No obvious rash, lesion, or ulcer.    LABORATORY PANEL:   CBC  Recent Labs Lab 05/18/15 1550  WBC 13.1*  HGB 12.4*  HCT 37.6*  PLT 210   ------------------------------------------------------------------------------------------------------------------  Chemistries   Recent Labs Lab 05/18/15 1550  NA 138  K 4.7  CL 107  CO2 26  GLUCOSE 179*  BUN 15  CREATININE 0.86  CALCIUM 9.2   ------------------------------------------------------------------------------------------------------------------  Cardiac Enzymes No results for input(s): TROPONINI in the last 168 hours. ------------------------------------------------------------------------------------------------------------------  RADIOLOGY:  Dg Chest 1 View  05/18/2015  CLINICAL DATA:  Larey Seat today at 0900 hours from a standing position. Fall occurred while doing balancing exercises. EXAM: CHEST 1 VIEW COMPARISON:  10/29/2014 FINDINGS: Previous median sternotomy and CABG. The heart remains mildly enlarged. Atherosclerosis of the aorta again seen. Pacemaker/ AICD inserted from the left appears unchanged. The pulmonary vascularity is normal. The lungs are clear. No effusions. IMPRESSION: No change.  No active disease.  Previous CABG and pacemaker/ AICD. Electronically Signed   By: Paulina Fusi M.D.   On: 05/18/2015 15:50   Ct Head Wo Contrast  05/18/2015  CLINICAL DATA:  Fall. EXAM: CT HEAD WITHOUT CONTRAST TECHNIQUE: Contiguous axial images were obtained from the base of the skull through the vertex without intravenous contrast. COMPARISON:  06/22/2011 FINDINGS:  Chronic bilateral basal  ganglia lacunar infarcts noted. There is mild low attenuation throughout the subcortical and periventricular white matter compatible with chronic microvascular disease. Prominence of the sulci and ventricles noted. No abnormal extra-axial fluid collections, intracranial hemorrhage or mass identified. No evidence for acute infarct. Partial opacification of the ethmoid air cells and sphenoid sinus noted. The mastoid air cells appear clear. The calvarium is intact. IMPRESSION: 1. No acute intracranial abnormalities. 2. Chronic microvascular disease and brain atrophy. 3. Ethmoid air cell and sphenoid sinus opacification. Electronically Signed   By: Signa Kell M.D.   On: 05/18/2015 17:02   Ct Hip Left Wo Contrast  05/18/2015  CLINICAL DATA:  Status post fall onto left hip, with left hip pain. Initial encounter. EXAM: CT OF THE LEFT HIP WITHOUT CONTRAST TECHNIQUE: Multidetector CT imaging of the left hip was performed according to the standard protocol. Multiplanar CT image reconstructions were also generated. COMPARISON:  Left hip radiographs performed earlier today at 3:26 p.m. FINDINGS: There is a minimally displaced subcapital fracture through the left femoral neck, with minimal comminution. The maximal head remains seated at the acetabulum. A trace associated joint effusion is noted. No additional fractures are seen. The pubic symphysis is unremarkable in appearance. Mild overlying soft tissue injury is noted at the left hip, without significant soft tissue hematoma. Scattered vascular calcifications are seen. The visualized portions of the bladder are unremarkable. The prostate is enlarged, measuring at least 5.3 cm in transverse dimension. IMPRESSION: 1. Minimally displaced subcapital fracture through the left femoral neck, with minimal comminution. Trace associated left hip joint effusion noted. 2. Mild overlying soft tissue injury, without significant soft tissue hematoma. 3. Scattered vascular  calcifications seen. 4. Enlarged prostate noted. Electronically Signed   By: Roanna Raider M.D.   On: 05/18/2015 18:21   Dg Hip Unilat With Pelvis 2-3 Views Left  05/18/2015  CLINICAL DATA:  Larey Seat this morning with left hip pain EXAM: DG HIP (WITH OR WITHOUT PELVIS) 2-3V LEFT COMPARISON:  None. FINDINGS: Moderate bilateral hip arthritis. Pelvic bones appear to be intact. Although fracture line is not identified there is foreshortening of the left proximal femur with cortical irregularity involving the lateral femoral neck. IMPRESSION: A subtle left femoral neck fracture is suspected. CT left hip would be helpful to better evaluate anatomy. Electronically Signed   By: Esperanza Heir M.D.   On: 05/18/2015 15:50     ASSESSMENT AND PLAN:   Active Problems:   Hip fracture (HCC)   1. IMPACTED left femoral neck fracture: Nothing by mouth, patient is scheduled for surgery today afternoon. Patient is at moderate risk for surgery secondary to cardiac history. #2,paroxysmal atrial fibrillation: It is post vitamin K 10 mg yesterday. INR today is 1.58. Scheduled for ORIF of the left hip. Resume Coumadin after surgery if  [facility] with cardiology. 3. History of ICD, PPM placement. History of ischemic cardiomyopathy #4 Diabetes mellitus with diabetic neuropathy: metformin, sliding scale with coverage after surgery. 5 GI, DVT prophylaxis.    All the records are reviewed and case discussed with Care Management/Social Workerr. Management plans discussed with the patient, family and they are in agreement.  CODE STATUS: *full  TOTAL TIME TAKING CARE OF THIS PATIENT: .   POSSIBLE D/C IN 1-2 DAYS, DEPENDING ON CLINICAL CONDITION.   Katha Hamming M.D on 05/19/2015 at 12:56 PM  Between 7am to 6pm - Pager - [phone]  After 6pm go to [email or web address] - Scientist, research (life sciences) Hospitalists  Office  [phone]  CC: Primary care physician; Sula RumpleVirk, Charanjit, MD

## 2015-05-19 NOTE — Transfer of Care (Signed)
Immediate Anesthesia Transfer of Care Note  Patient: Riley BimlerMilton Harner Jr.  Procedure(s) Performed: Procedure(s): CANNULATED HIP PINNING (Left)  Patient Location: PACU  Anesthesia Type:General  Level of Consciousness: awake  Airway & Oxygen Therapy: Patient connected to face mask oxygen  Post-op Assessment: Report given to RN  Post vital signs: stable  Last Vitals:  Filed Vitals:   05/19/15 0824  BP: 116/56  Pulse: 86  Temp:   Resp:     Complications: No apparent anesthesia complications

## 2015-05-19 NOTE — Progress Notes (Signed)
ANTICOAGULATION CONSULT NOTE - Initial Consult  Pharmacy Consult for Coumadin Indication: atrial fibrillation  Allergies  Allergen Reactions  . Bactrim [Sulfamethoxazole-Trimethoprim] Other (See Comments)    Reaction:  Unknown     Patient Measurements: Height: 5\' 11"  (180.3 cm) Weight: 178 lb (80.74 kg) IBW/kg (Calculated) : 75.3   Vital Signs: Temp: 98.4 F (36.9 C) (11/15 1546) Temp Source: Oral (11/15 1546) BP: 134/57 mmHg (11/15 1546) Pulse Rate: 79 (11/15 1546)  Labs:  Recent Labs  05/18/15 1550 05/19/15 0610 05/19/15 0843  HGB 12.4*  --   --   HCT 37.6*  --   --   PLT 210  --   --   APTT 43*  --   --   LABPROT 27.5* 19.9* 18.9*  INR 2.60 1.69 1.58  CREATININE 0.86  --   --     Estimated Creatinine Clearance: 69.3 mL/min (by C-G formula based on Cr of 0.86).   Medical History: Past Medical History  Diagnosis Date  . Diabetes mellitus without complication (HCC)   . Hypertension     Medications:  Scheduled:  . aspirin EC  81 mg Oral Daily  .  ceFAZolin (ANCEF) IV  2 g Intravenous Q6H  . cholecalciferol  1,000 Units Oral Daily  . docusate sodium  100 mg Oral BID  . ferrous sulfate  325 mg Oral TID PC  . gabapentin  100 mg Oral QHS  . insulin aspart  0-15 Units Subcutaneous TID WC  . lisinopril  10 mg Oral Daily  . metFORMIN  1,000 mg Oral BID WC  . pantoprazole  40 mg Oral Daily  . pravastatin  40 mg Oral q1800  . tamsulosin  0.4 mg Oral BID  . warfarin  5 mg Oral Daily   Infusions:  . sodium chloride 75 mL/hr (05/19/15 1531)    Assessment: 81 y/o M with a h/o PAF on Coumadin 5 mg daily PTA with therapeutic INR on admission. Patient received 10 mg iv vitamin k prior to hip fracture today and is to continue warfarin from tonight.   Goal of Therapy:  INR 2-3  Plan:  Agree with MD ordered dosing of Coumadin 5 mg daily which is a continuation of outpatient dosing. Will f/u AM INR.   Luisa Harthristy, Elene Downum D 05/19/2015,3:53 PM

## 2015-05-19 NOTE — Op Note (Signed)
05/18/2015 - 05/19/2015  12:52 PM  PATIENT:  Riley BimlerMilton Alcon Jr.    PRE-OPERATIVE DIAGNOSIS:  LEFT VALGUS IMPACTED FEMORAL NECK HIP FRACTURE  POST-OPERATIVE DIAGNOSIS:  Same  PROCEDURE:  PERCUTANEOUS CANNULATED SCREW FIXATION OF LEFT FEMORAL NECK HIP FRACTURE  SURGEON:  Juanell FairlyKRASINSKI, Bell Carbo, MD  ANESTHESIA:   Gen. endotracheal intubation  PREOPERATIVE INDICATIONS:  Riley BimlerMilton Boyett Jr. is a  81 y.o. male who fell at home and was found to have a diagnosis of valgus impacted left femoral neck HIP FRACTURE.  I recommended percutaneous screw fixation for the above-noted fracture.   The risks benefits and alternatives were discussed with the patient preoperatively including but not limited to infection, bleeding, nerve or blood vessel injury, persistent hip pain,  malunion, nonunion, avascular necrosis, change in lower extremity rotation, leg length discrepancy, failure of the hardware and the need for revision surgery, including the potential for conversion to hemi or total hip arthroplasty. Medical risks include but are not limited to: DVT and pulmonary embolism, myocardial infarction, stroke, pneumonia, respiratory failure and death.  The patient understood and agreed with the plan for surgery.  Patient had the left hip marked with the word yes and my initials according the hospitals correct site of surgery protocol.  OPERATIVE IMPLANTS: 7.3 mm cannulated screws x 3  OPERATIVE FINDINGS: Osteoporotic bone of left hip  OPERATIVE PROCEDURE: The patient was brought to the operating room and placed supine on the fracture table. 2 g of IV Kefzol were administered. General endotracheal intubation was administered by the anesthesia service.  The left lower extremity was positioned in a leg holder, without any significant reduction maneuver other than mild internal rotation.  The hip was prepped and draped in usual sterile fashion.  A time out was performed to verify the patient's name, date of birth,  medical record number, correct site of surgery and correct surger to be performed. The  timeout was also used to verify the patient had received antibiotics that all appropriate instruments, implants and radiographic studies were available in the room. Once all in attendance were in agreement case began.. Patient had the surgical site injected with quarter percent Marcaine with epi to help reduce operative bleeding.  A small lateral incision was made in line with the femur,distal to the greater trochanter, and 3 guidewires were introduced In an inverted triangle configuration. The position of these guidewires was confirmed on AP and lateral C-arm imaging. The lengths of these guide pins were measured with a depth gauge.  The lateral cortex was opened with a cannulated drill, and then the cannulated screws were advanced into position and tightened by hand. Solid fixation was achieved. Final C-arm images of the percutaneous cannulated screw fixation were taken.  The wound were irrigated copiously, and the deep and subcutaneous tissues were repaired with 0 and 2-0 Vicryl suture and the skin was approximated with staples.  A dry sterile dressing was applied. Patient was given a quarter percent Marcaine with epi at the wound site to help reduce postoperative bleeding and pain. I was scrubbed and present for the entire case and all sharp and instrument counts were correct at the conclusion of the case.  I spoke with the family in the postop consultation room to let them know the case was completed without complication and the patient was stable in the recovery room.    Kathreen DevoidKevin L. Shayleigh Bouldin, MD

## 2015-05-20 LAB — BASIC METABOLIC PANEL
Anion gap: 6 (ref 5–15)
BUN: 12 mg/dL (ref 6–20)
CHLORIDE: 107 mmol/L (ref 101–111)
CO2: 23 mmol/L (ref 22–32)
CREATININE: 0.79 mg/dL (ref 0.61–1.24)
Calcium: 7.8 mg/dL — ABNORMAL LOW (ref 8.9–10.3)
GFR calc Af Amer: >60 mL/min (ref >60)
GFR calc non Af Amer: >60 mL/min (ref >60)
GLUCOSE: 123 mg/dL — AB (ref 65–99)
POTASSIUM: 4.4 mmol/L (ref 3.5–5.1)
SODIUM: 136 mmol/L (ref 135–145)

## 2015-05-20 LAB — CBC
HEMATOCRIT: 31.8 % — AB (ref 40.0–52.0)
HEMOGLOBIN: 10.8 g/dL — AB (ref 13.0–18.0)
MCH: 30.7 pg (ref 26.0–34.0)
MCHC: 34.1 g/dL (ref 32.0–36.0)
MCV: 90 fL (ref 80.0–100.0)
Platelets: 150 10*3/uL (ref 150–440)
RBC: 3.53 MIL/uL — AB (ref 4.40–5.90)
RDW: 13.5 % (ref 11.5–14.5)
WBC: 9.7 10*3/uL (ref 3.8–10.6)

## 2015-05-20 LAB — GLUCOSE, CAPILLARY
GLUCOSE-CAPILLARY: 111 mg/dL — AB (ref 65–99)
GLUCOSE-CAPILLARY: 100 mg/dL — AB (ref 65–99)
Glucose-Capillary: 122 mg/dL — ABNORMAL HIGH (ref 65–99)
Glucose-Capillary: 106 mg/dL — ABNORMAL HIGH (ref 65–99)

## 2015-05-20 LAB — URINE CULTURE: CULTURE: NO GROWTH

## 2015-05-20 LAB — PROTIME-INR
INR: 1.44
PROTHROMBIN TIME: 17.6 s — AB (ref 11.4–15.0)

## 2015-05-20 MED ORDER — WARFARIN SODIUM 5 MG PO TABS
7.5000 mg | ORAL_TABLET | Freq: Once | ORAL | Status: AC
  Administered 2015-05-20: 7.5 mg via ORAL
  Filled 2015-05-20: qty 1

## 2015-05-20 MED ORDER — WARFARIN - PHARMACIST DOSING INPATIENT
Freq: Every day | Status: DC
Start: 2015-05-20 — End: 2015-05-22

## 2015-05-20 NOTE — Progress Notes (Signed)
Subjective:  Percutaneous fixation of left femoral neck hip fracture. Patient reports pain as moderate.  Patient states he was able to get out of bed today with max assist. He denies any chest pain, shortness of breath or abdominal pain. His wife is at the bedside this evening.  Objective:   VITALS:   Filed Vitals:   05/20/15 0421 05/20/15 0745 05/20/15 1140 05/20/15 1547  BP: 132/56 118/57  131/58  Pulse: 69 77 78 88  Temp: 98.5 F (36.9 C) 98.2 F (36.8 C)  99.7 F (37.6 C)  TempSrc: Oral Oral  Oral  Resp: 18 16  18   Height:      Weight:      SpO2: 97% 97% 91% 94%    PHYSICAL EXAM:  Left lower extremity: Patient's dressing is clean dry and intact. He is no erythema or ecchymosis or swelling. His thigh compartments are soft and compressible. Pedal pulses are palpable. He can dorsiflex and plantarflex his ankle and flex and extend his toes. His sensation in both feet is diminished to light touch secondary to peripheral neuropathy.   LABS  Results for orders placed or performed during the hospital encounter of 05/18/15 (from the past 24 hour(s))  CBC     Status: Abnormal   Collection Time: 05/20/15  6:07 AM  Result Value Ref Range   WBC 9.7 3.8 - 10.6 K/uL   RBC 3.53 (L) 4.40 - 5.90 MIL/uL   Hemoglobin 10.8 (L) 13.0 - 18.0 g/dL   HCT 11.931.8 (L) 14.740.0 - 82.952.0 %   MCV 90.0 80.0 - 100.0 fL   MCH 30.7 26.0 - 34.0 pg   MCHC 34.1 32.0 - 36.0 g/dL   RDW 56.213.5 13.011.5 - 86.514.5 %   Platelets 150 150 - 440 K/uL  Basic metabolic panel     Status: Abnormal   Collection Time: 05/20/15  6:07 AM  Result Value Ref Range   Sodium 136 135 - 145 mmol/L   Potassium 4.4 3.5 - 5.1 mmol/L   Chloride 107 101 - 111 mmol/L   CO2 23 22 - 32 mmol/L   Glucose, Bld 123 (H) 65 - 99 mg/dL   BUN 12 6 - 20 mg/dL   Creatinine, Ser 7.840.79 0.61 - 1.24 mg/dL   Calcium 7.8 (L) 8.9 - 10.3 mg/dL   GFR calc non Af Amer >60 >60 mL/min   GFR calc Af Amer >60 >60 mL/min   Anion gap 6 5 - 15  Protime-INR     Status:  Abnormal   Collection Time: 05/20/15  6:07 AM  Result Value Ref Range   Prothrombin Time 17.6 (H) 11.4 - 15.0 seconds   INR 1.44   Glucose, capillary     Status: Abnormal   Collection Time: 05/20/15 12:12 PM  Result Value Ref Range   Glucose-Capillary 122 (H) 65 - 99 mg/dL   Comment 1 Notify RN   Glucose, capillary     Status: Abnormal   Collection Time: 05/20/15  3:48 PM  Result Value Ref Range   Glucose-Capillary 100 (H) 65 - 99 mg/dL   Comment 1 Notify RN     Dg Hip Port Unilat With Pelvis 1v Left  05/19/2015  CLINICAL DATA:  ORIF traumatic subcapital left femoral neck fracture. EXAM: DG HIP (WITH OR WITHOUT PELVIS) 1V PORT LEFT COMPARISON:  Preoperative x-rays and CT yesterday. FINDINGS: ORIF of the subcapital left femoral neck fracture with 3 cannulated screws. Alignment appears anatomic. No acute complicating features. Moderate symmetric joint space narrowing  in both hips again noted. IMPRESSION: Anatomic alignment post ORIF of the subcapital left femoral neck fracture without acute complicating features. Electronically Signed   By: Hulan Saas M.D.   On: 05/19/2015 13:20   Dg Hip Operative Unilat With Pelvis Left  05/19/2015  CLINICAL DATA:  Left femoral neck fracture, cannulated screw fixation. EXAM: OPERATIVE left HIP (WITH PELVIS IF PERFORMED) 3 VIEWS TECHNIQUE: Fluoroscopic spot image(s) were submitted for interpretation post-operatively. COMPARISON:  05/18/2015 FINDINGS: Three intraoperative spot images of the left hip demonstrate 3 cannulated screws extending axially through the hip, with threaded portions believed to be just distal to the subcapital fracture. IMPRESSION: 1. Cannulated screw fixation of subcapital hip fracture, without complicating feature identified. Electronically Signed   By: Gaylyn Rong M.D.   On: 05/19/2015 15:39    Assessment/Plan: 1 Day Post-Op   Active Problems:   Hip fracture [facility])  Patient is making appropriate progress  postoperatively. Continue physical and occupational therapy. Recheck labs in the a.m. Encourage incentive spirometry while awake.    Juanell Fairly , MD 05/20/2015, 5:12 PM

## 2015-05-20 NOTE — NC FL2 (Signed)
[place] MEDICAID FL2 LEVEL OF CARE SCREENING TOOL     IDENTIFICATION  Patient Name: Riley Sanchez. Birthdate: 11/15/1929 Sex: male Admission Date (Current Location): 05/18/2015  [place] and [place] Number:  [facility] )   Facility and Address:  [facility], [address], [place], [place] [address]      Provider Number: [id number] [phone])  Attending Physician Name and Address:  Katha Hamming, MD  Relative Name and Phone Number:       Current Level of Care: Hospital Recommended Level of Care: Skilled Nursing Facility Prior Approval Number:    Date Approved/Denied:   PASRR Number:  [id number])  Discharge Plan: SNF    Current Diagnoses: Patient Active Problem List   Diagnosis Date Noted  . Hip fracture (HCC) 05/18/2015    Orientation ACTIVITIES/SOCIAL BLADDER RESPIRATION    Self, Time, Situation, Place  Active Continent O2 (As needed) (.5 liters of oxygen )  BEHAVIORAL SYMPTOMS/MOOD NEUROLOGICAL BOWEL NUTRITION STATUS   (none )  (none ) Continent Diet (Diet: Carb Modified )  PHYSICIAN VISITS COMMUNICATION OF NEEDS Height & Weight Skin  30 days Verbally  (180.3 cm) 178 lbs. Surgical wounds (Incision: Left Hip. )          AMBULATORY STATUS RESPIRATION    Assist extensive O2 (As needed) (.5 liters of oxygen )      Personal Care Assistance Level of Assistance  Bathing, Feeding, Dressing Bathing Assistance: Limited assistance Feeding assistance: Independent Dressing Assistance: Limited assistance      Functional Limitations Info  Sight, Hearing, Speech Sight Info: Adequate Hearing Info: Adequate Speech Info: Adequate       SPECIAL CARE FACTORS FREQUENCY  PT (By licensed PT), OT (By licensed OT)     PT Frequency:  (5) OT Frequency:  (5)           Additional Factors Info  Code Status, Allergies, Insulin Sliding Scale Code Status Info:  (Full Code. ) Allergies Info:  (Bactrim  Sulfamethoxazole-trimethoprim)   Insulin Sliding Scale Info:  (Novolog Insulin Injections. )       Current Medications (05/20/2015): Current Facility-Administered Medications  Medication Dose Route Frequency Provider Last Rate Last Dose  . 0.9 %  sodium chloride infusion  75 mL/hr Intravenous Continuous Juanell Fairly, MD 75 mL/hr at 05/19/15 2257 75 mL/hr at 05/19/15 2257  . acetaminophen (TYLENOL) tablet 650 mg  650 mg Oral Q6H PRN Juanell Fairly, MD   650 mg at 05/20/15 0601   Or  . acetaminophen (TYLENOL) suppository 650 mg  650 mg Rectal Q6H PRN Juanell Fairly, MD      . alum & mag hydroxide-simeth (MAALOX/MYLANTA) 200-200-20 MG/5ML suspension 30 mL  30 mL Oral Q4H PRN Juanell Fairly, MD      . aspirin EC tablet 81 mg  81 mg Oral Daily Juanell Fairly, MD   81 mg at 05/20/15 0957  . bisacodyl (DULCOLAX) suppository 10 mg  10 mg Rectal Daily PRN Juanell Fairly, MD      . cholecalciferol (VITAMIN D) tablet 1,000 Units  1,000 Units Oral Daily Enedina Finner, MD   1,000 Units at 05/20/15 0957  . docusate sodium (COLACE) capsule 100 mg  100 mg Oral BID Juanell Fairly, MD   100 mg at 05/20/15 0957  . fentaNYL (SUBLIMAZE) injection 25 mcg  25 mcg Intravenous Q5 min PRN Berdine Addison, MD   25 mcg at 05/19/15 1306  . ferrous sulfate tablet 325 mg  325 mg Oral TID PC Juanell Fairly,  MD   325 mg at 05/20/15 1307  . gabapentin (NEURONTIN) capsule 100 mg  100 mg Oral QHS Enedina FinnerSona Patel, MD   100 mg at 05/19/15 2147  . insulin aspart (novoLOG) injection 0-15 Units  0-15 Units Subcutaneous TID WC Enedina FinnerSona Patel, MD   2 Units at 05/20/15 1300  . lisinopril (PRINIVIL,ZESTRIL) tablet 10 mg  10 mg Oral Daily Enedina FinnerSona Patel, MD   10 mg at 05/20/15 0956  . magnesium citrate solution 1 Bottle  1 Bottle Oral Once PRN Juanell FairlyKevin Krasinski, MD      . menthol-cetylpyridinium (CEPACOL) lozenge 3 mg  1 lozenge Oral PRN Juanell FairlyKevin Krasinski, MD       Or  . phenol (CHLORASEPTIC) mouth spray 1 spray  1 spray Mouth/Throat PRN Juanell FairlyKevin  Krasinski, MD      . metFORMIN (GLUCOPHAGE) tablet 1,000 mg  1,000 mg Oral BID WC Juanell FairlyKevin Krasinski, MD   1,000 mg at 05/20/15 0839  . morphine 2 MG/ML injection 2 mg  2 mg Intravenous Q2H PRN Juanell FairlyKevin Krasinski, MD      . nitroGLYCERIN (NITROSTAT) SL tablet 0.4 mg  0.4 mg Sublingual Q5 min PRN Enedina FinnerSona Patel, MD      . ondansetron [facility](ZOFRAN) injection 4 mg  4 mg Intravenous Once PRN Berdine AddisonMathai Thomas, MD      . ondansetron [facility](ZOFRAN) tablet 4 mg  4 mg Oral Q6H PRN Juanell FairlyKevin Krasinski, MD       Or  . ondansetron [facility](ZOFRAN) injection 4 mg  4 mg Intravenous Q6H PRN Juanell FairlyKevin Krasinski, MD      . oxyCODONE (Oxy IR/ROXICODONE) immediate release tablet 5-10 mg  5-10 mg Oral Q4H PRN Juanell FairlyKevin Krasinski, MD   5 mg at 05/20/15 0956  . pantoprazole (PROTONIX) EC tablet 40 mg  40 mg Oral Daily Enedina FinnerSona Patel, MD   40 mg at 05/20/15 0957  . polyethylene glycol (MIRALAX / GLYCOLAX) packet 17 g  17 g Oral Daily PRN Juanell FairlyKevin Krasinski, MD      . pravastatin (PRAVACHOL) tablet 40 mg  40 mg Oral q1800 Enedina FinnerSona Patel, MD   40 mg at 05/19/15 1744  . tamsulosin (FLOMAX) capsule 0.4 mg  0.4 mg Oral BID Enedina FinnerSona Patel, MD   0.4 mg at 05/20/15 0957  . warfarin (COUMADIN) tablet 7.5 mg  7.5 mg Oral ONCE-1800 Olene FlossMelissa D Maccia, RPH      . Warfarin - Pharmacist Dosing Inpatient   Does not apply q1800 Olene FlossMelissa D Maccia, RPH       Do not use this list as official medication orders. Please verify with discharge summary.  Discharge Medications:   Medication List    ASK your doctor about these medications        aspirin EC 81 MG tablet  Take 81 mg by mouth daily.     cholecalciferol 1000 UNITS tablet  Commonly known as:  VITAMIN D  Take 1,000 Units by mouth daily.     diphenhydrAMINE 25 MG tablet  Commonly known as:  BENADRYL  Take 25 mg by mouth at bedtime as needed for sleep.     gabapentin 100 MG capsule  Commonly known as:  NEURONTIN  Take 1 capsule (100 mg total) by mouth at bedtime.     lisinopril 10 MG tablet  Commonly known as:  PRINIVIL,ZESTRIL   Take 10 mg by mouth daily.     lovastatin 40 MG tablet  Commonly known as:  MEVACOR  Take 40 mg by mouth at bedtime.     metFORMIN 1000 MG  tablet  Commonly known as:  GLUCOPHAGE  Take 1,000 mg by mouth 2 (two) times daily with a meal.     nitroGLYCERIN 0.4 MG SL tablet  Commonly known as:  NITROSTAT  Place 0.4 mg under the tongue every 5 (five) minutes as needed for chest pain.     pantoprazole 20 MG tablet  Commonly known as:  PROTONIX  Take 20 mg by mouth daily.     tamsulosin 0.4 MG Caps capsule  Commonly known as:  FLOMAX  Take 0.4 mg by mouth 2 (two) times daily.     warfarin 5 MG tablet  Commonly known as:  COUMADIN  Take 5 mg by mouth daily.        Relevant Imaging Results:  Relevant Lab Results:  Recent Labs    Additional Information  (SSN: [id number])  Haig Prophet, LCSW

## 2015-05-20 NOTE — Evaluation (Signed)
Physical Therapy Evaluation Patient Details Name: Alvy BimlerMilton Modisette Jr. MRN: [id number] DOB: [birth year] Today's Date: 05/20/2015   History of Present Illness  Pt is an 81 y.o. male s/p mechanical fall suffering L femoral neck hip fx.  Pt s/p 05/19/15 percutaneous cannulated screw fixation of L femoral neck hip fx.  PMH includes LE neuropathy, paroxysmal a-fib on warfarin, chronic systolic CHF, biventricular ICD.  Clinical Impression  Currently pt demonstrates impairments with strength, L knee ROM, balance, pain, and limitations with functional mobility.  Prior to admission, pt was independent without AD (although pt reports prior balance issues d/t LE neuropathy).  Pt lives with his wife in 1 level home with stairs to enter.  Currently pt requires 2 assist with bed mobility and stand pivot transfer bed to chair (pt c/o significant L hip pain with any movement and unable to maintain WB'ing precautions without significant assist).  Pt would benefit from skilled PT to address above noted impairments and functional limitations.  Recommend pt discharge to STR when medically appropriate.     Follow Up Recommendations SNF    Equipment Recommendations       Recommendations for Other Services       Precautions / Restrictions Precautions Precautions: Fall Restrictions Weight Bearing Restrictions: Yes LLE Weight Bearing: Non weight bearing      Mobility  Bed Mobility Overal bed mobility: Needs Assistance;+2 for physical assistance Bed Mobility: Supine to Sit     Supine to sit: Mod assist;Max assist;+2 for physical assistance;HOB elevated     General bed mobility comments: assist for trunk and B LE's; assist for sitting balance initially d/t pt leaning to R d/t significant L hip pain  Transfers Overall transfer level: Needs assistance Equipment used: Rolling walker (2 wheeled) Transfers: Sit to/from UGI CorporationStand;Stand Pivot Transfers Sit to Stand: Max assist;+2 physical assistance (with  RW) Stand pivot transfers: Max assist;+2 physical assistance (stand pivot to R no AD bed to recliner)       General transfer comment: pt unable to maintain NWB L LE without significant assist (with use of RW) so required stand pivot transfer to R to maintain precautions and also d/t significant assist levels; vc's required for technique  Ambulation/Gait             General Gait Details: not appropriate at this time d/t significant assist levels and difficulty maintaining WB'ing precautions  Stairs            Wheelchair Mobility    Modified Rankin (Stroke Patients Only)       Balance Overall balance assessment: Needs assistance Sitting-balance support: Bilateral upper extremity supported;Feet supported Sitting balance-Leahy Scale: Poor Sitting balance - Comments: R lean d/t significant L hip pain Postural control: Right lateral lean   Standing balance-Leahy Scale: Poor Standing balance comment: with use of RW                             Pertinent Vitals/Pain Pain Assessment: 0-10 Pain Score:  (0/10 at rest; 10/10 with movement) Pain Location: L hip Pain Descriptors / Indicators: Sore;Tender;Aching;Constant;Sharp;Shooting Pain Intervention(s): Limited activity within patient's tolerance;Monitored during session;Premedicated before session;Repositioned    Home Living Family/patient expects to be discharged to:: Skilled nursing facility Living Arrangements: Spouse/significant other   Type of Home: House Home Access: Stairs to enter   Entergy CorporationEntrance Stairs-Number of Steps: 5 with L rail Home Layout: One level   Additional Comments: Owns RW    Prior Function Level of Independence:  Independent         Comments: Pt reports h/o balance issues d/t LE neuropathy.     Hand Dominance        Extremity/Trunk Assessment   Upper Extremity Assessment: Overall WFL for tasks assessed           Lower Extremity Assessment: RLE deficits/detail;LLE  deficits/detail RLE Deficits / Details: R hip flexion 3+/5; R knee flexion/extension 3+/5; R DF 4/5 LLE Deficits / Details: L hip flexion 2/5; L knee flexion/extension 2/5; L DF 3+/5; L knee extension 20 degrees short of neutral     Communication   Communication: No difficulties  Cognition Arousal/Alertness: Awake/alert Behavior During Therapy: WFL for tasks assessed/performed Overall Cognitive Status: Within Functional Limits for tasks assessed                      General Comments General comments (skin integrity, edema, etc.): L hip dressings in place  Nursing cleared pt for participation in physical therapy.  Pt agreeable to PT session.    Exercises   Performed semi-supine B LE therapeutic exercise x 10 reps:  Ankle pumps (AROM B LE's); quad sets x3 second holds (AROM R LE; AAROM L LE d/t trace contraction noted); glute squeezes x3 second holds (AROM B); heelslides (AROM R; AAROM L through minimal range d/t L hip pain), hip abd/adduction (AROM R; AAROM L through minimal range d/t L hip pain).  Pt required vc's and tactile cues for correct technique with exercises.       Assessment/Plan    PT Assessment Patient needs continued PT services  PT Diagnosis Difficulty walking;Acute pain   PT Problem List Decreased strength;Decreased range of motion;Decreased activity tolerance;Decreased balance;Decreased mobility;Decreased knowledge of use of DME;Decreased knowledge of precautions;Pain  PT Treatment Interventions DME instruction;Gait training;Stair training;Functional mobility training;Therapeutic activities;Therapeutic exercise;Balance training;Patient/family education;Manual techniques   PT Goals (Current goals can be found in the Care Plan section) Acute Rehab PT Goals Patient Stated Goal: to get better PT Goal Formulation: With patient Time For Goal Achievement: 06/03/15 Potential to Achieve Goals: Good    Frequency BID   Barriers to discharge Decreased caregiver  support      Co-evaluation               End of Session Equipment Utilized During Treatment: Gait belt;Oxygen Activity Tolerance: Patient limited by pain Patient left: in chair;with call bell/phone within reach;with chair alarm set;with SCD's reapplied (B heels elevated via pillow) Nurse Communication: Mobility status;Need for lift equipment;Precautions;Weight bearing status         Time: 1110-1145 PT Time Calculation (min) (ACUTE ONLY): 35 min   Charges:   PT Evaluation $Initial PT Evaluation Tier I: 1 Procedure PT Treatments $Therapeutic Exercise: 8-22 mins   PT G CodesHendricks Limes 2015/06/03, 12:10 PM Hendricks Limes, PT [phone]

## 2015-05-20 NOTE — Clinical Social Work Note (Signed)
Clinical Social Work Assessment  Patient Details  Name: Riley Sanchez. MRN: [id number] Date of Birth: Nov 25, 1929  Date of referral:  05/20/15               Reason for consult:  Facility Placement                Permission sought to share information with:  Chartered certified accountant granted to share information::  Yes, Verbal Permission Granted  Name::      Riley Sanchez::   Riley Sanchez  Relationship::     Contact Information:     Housing/Transportation Living arrangements for the past 2 months:  [place] of Information:  Patient Patient Interpreter Needed:  None Criminal Activity/Legal Involvement Pertinent to Current Situation/Hospitalization:  No - Comment as needed Significant Relationships:  Spouse Lives with:  Spouse Do you feel safe going back to the place where you live?  Yes Need for family participation in patient care:  Yes (Comment)  Care giving concerns:  Patient lives in [place] with his wife Riley Sanchez.    Social Worker assessment / plan:  Holiday representative (CSW) received SNF consult. PT is recommending SNF. CSW met with patient alone at bedside. Patient was sitting up in the chair and was alert and oriented. CSW introduced self and explained role of CSW department. Patient reported that he lives in [place] with his wife Riley Sanchez of 22 years. CSW explained that PT is recommending SNF. Patient reported that he prefers to go home however he is agreeable to SNF search. Patient prefers [facility].   FL2 complete and faxed out. CSW will continue to follow and assist as needed.    Employment status:  Retired Nurse, adult PT Recommendations:  [place] / Referral to community resources:  [place]  Patient/Family's Response to care:  Patient is agreeable to [employer] and prefers [employer].   Patient/Family's Understanding of and Emotional  Response to Diagnosis, Current Treatment, and Prognosis: Patient was pleasant throughout assessment.   Emotional Assessment Appearance:  Appears stated age Attitude/Demeanor/Rapport:    Affect (typically observed):  Accepting, Adaptable, Pleasant Orientation:  Oriented to Self, Oriented to Place, Oriented to  Time, Oriented to Situation Alcohol / Substance use:  Not Applicable Psych involvement (Current and /or in the community):  No (Comment)  Discharge Needs  Concerns to be addressed:  Discharge Planning Concerns Readmission within the last 30 days:  No Current discharge risk:  Dependent with Mobility Barriers to Discharge:  Continued Medical Work up   Riley Sanchez 05/20/2015, 3:41 PM

## 2015-05-20 NOTE — Progress Notes (Signed)
[facility] [place] [facility]   PATIENT NAME: Riley Sanchez    MR#:  [id number]  DATE OF BIRTH:  06-Feb-1930  SUBJECTIVE: Admitted for fall, left hip fracture. Status post repair . patient is sitting in the chair but has terrible pain in the hip even with minimal movement.   CHIEF COMPLAINT:   Chief Complaint  Patient presents with  . Fall  . Hip Pain    REVIEW OF SYSTEMS:    Review of Systems  Constitutional: Negative for fever.  Eyes: Negative for blurred vision.  Respiratory: Negative for cough.   Cardiovascular: Negative for chest pain.  Gastrointestinal: Negative for heartburn.  Genitourinary: Negative for dysuria.  Musculoskeletal: Negative for myalgias.       Left hip pain  Neurological: Negative for dizziness, speech change and headaches.  Endo/Heme/Allergies: Does not bruise/bleed easily.    Nutrition:  Tolerating Diet: Tolerating PT:      DRUG ALLERGIES:   Allergies  Allergen Reactions  . Bactrim [Sulfamethoxazole-Trimethoprim] Other (See Comments)    Reaction:  Unknown     VITALS:  Blood pressure 118/57, pulse 78, temperature 98.2 F (36.8 C), temperature source Oral, resp. rate 16, height  (1.803 m), weight 80.74 kg (178 lb), SpO2 91 %.  PHYSICAL EXAMINATION:   Physical Exam  GENERAL:  81 y.o.-year-old patient lying in the bed with no acute distress.  EYES: Pupils equal, round, reactive to light and accommodation. No scleral icterus. Extraocular muscles intact.  HEENT: Head atraumatic, normocephalic. Oropharynx and nasopharynx clear.  NECK:  Supple, no jugular venous distention. No thyroid enlargement, no tenderness.  LUNGS: Normal breath sounds bilaterally, no wheezing, rales,rhonchi or crepitation. No use of accessory muscles of respiration.  CARDIOVASCULAR: S1, S2 normal. No murmurs, rubs, or gallops.  ABDOMEN: Soft, nontender, nondistended. Bowel sounds present. No organomegaly or mass.  EXTREMITIES:  Severe left thigh pain. Sensations in the legs secondary to diabetic neuropathy. Peripheral  pulses are intact. NEUROLOGIC: Cranial nerves II through XII are intact. Muscle strength 5/5 in all extremities. Sensation intact. Gait not checked.  PSYCHIATRIC: The patient is alert and oriented x 3.  SKIN: No obvious rash, lesion, or ulcer.    LABORATORY PANEL:   CBC  Recent Labs Lab 05/20/15 0607  WBC 9.7  HGB 10.8*  HCT 31.8*  PLT 150   ------------------------------------------------------------------------------------------------------------------  Chemistries   Recent Labs Lab 05/20/15 0607  NA 136  K 4.4  CL 107  CO2 23  GLUCOSE 123*  BUN 12  CREATININE 0.79  CALCIUM 7.8*   ------------------------------------------------------------------------------------------------------------------  Cardiac Enzymes No results for input(s): TROPONINI in the last 168 hours. ------------------------------------------------------------------------------------------------------------------  RADIOLOGY:  Dg Chest 1 View  05/18/2015  CLINICAL DATA:  Larey Seat today at 0900 hours from a standing position. Fall occurred while doing balancing exercises. EXAM: CHEST 1 VIEW COMPARISON:  10/29/2014 FINDINGS: Previous median sternotomy and CABG. The heart remains mildly enlarged. Atherosclerosis of the aorta again seen. Pacemaker/ AICD inserted from the left appears unchanged. The pulmonary vascularity is normal. The lungs are clear. No effusions. IMPRESSION: No change.  No active disease.  Previous CABG and pacemaker/ AICD. Electronically Signed   By: Paulina Fusi M.D.   On: 05/18/2015 15:50   Ct Head Wo Contrast  05/18/2015  CLINICAL DATA:  Fall. EXAM: CT HEAD WITHOUT CONTRAST TECHNIQUE: Contiguous axial images were obtained from the base of the skull through the vertex without intravenous contrast. COMPARISON:  06/22/2011 FINDINGS: Chronic bilateral basal ganglia lacunar infarcts noted. There  is  mild low attenuation throughout the subcortical and periventricular white matter compatible with chronic microvascular disease. Prominence of the sulci and ventricles noted. No abnormal extra-axial fluid collections, intracranial hemorrhage or mass identified. No evidence for acute infarct. Partial opacification of the ethmoid air cells and sphenoid sinus noted. The mastoid air cells appear clear. The calvarium is intact. IMPRESSION: 1. No acute intracranial abnormalities. 2. Chronic microvascular disease and brain atrophy. 3. Ethmoid air cell and sphenoid sinus opacification. Electronically Signed   By: Signa Kell M.D.   On: 05/18/2015 17:02   Ct Hip Left Wo Contrast  05/18/2015  CLINICAL DATA:  Status post fall onto left hip, with left hip pain. Initial encounter. EXAM: CT OF THE LEFT HIP WITHOUT CONTRAST TECHNIQUE: Multidetector CT imaging of the left hip was performed according to the standard protocol. Multiplanar CT image reconstructions were also generated. COMPARISON:  Left hip radiographs performed earlier today at 3:26 p.m. FINDINGS: There is a minimally displaced subcapital fracture through the left femoral neck, with minimal comminution. The maximal head remains seated at the acetabulum. A trace associated joint effusion is noted. No additional fractures are seen. The pubic symphysis is unremarkable in appearance. Mild overlying soft tissue injury is noted at the left hip, without significant soft tissue hematoma. Scattered vascular calcifications are seen. The visualized portions of the bladder are unremarkable. The prostate is enlarged, measuring at least 5.3 cm in transverse dimension. IMPRESSION: 1. Minimally displaced subcapital fracture through the left femoral neck, with minimal comminution. Trace associated left hip joint effusion noted. 2. Mild overlying soft tissue injury, without significant soft tissue hematoma. 3. Scattered vascular calcifications seen. 4. Enlarged prostate noted.  Electronically Signed   By: Roanna Raider M.D.   On: 05/18/2015 18:21   Dg Hip Port Unilat With Pelvis 1v Left  05/19/2015  CLINICAL DATA:  ORIF traumatic subcapital left femoral neck fracture. EXAM: DG HIP (WITH OR WITHOUT PELVIS) 1V PORT LEFT COMPARISON:  Preoperative x-rays and CT yesterday. FINDINGS: ORIF of the subcapital left femoral neck fracture with 3 cannulated screws. Alignment appears anatomic. No acute complicating features. Moderate symmetric joint space narrowing in both hips again noted. IMPRESSION: Anatomic alignment post ORIF of the subcapital left femoral neck fracture without acute complicating features. Electronically Signed   By: Hulan Saas M.D.   On: 05/19/2015 13:20   Dg Hip Operative Unilat With Pelvis Left  05/19/2015  CLINICAL DATA:  Left femoral neck fracture, cannulated screw fixation. EXAM: OPERATIVE left HIP (WITH PELVIS IF PERFORMED) 3 VIEWS TECHNIQUE: Fluoroscopic spot image(s) were submitted for interpretation post-operatively. COMPARISON:  05/18/2015 FINDINGS: Three intraoperative spot images of the left hip demonstrate 3 cannulated screws extending axially through the hip, with threaded portions believed to be just distal to the subcapital fracture. IMPRESSION: 1. Cannulated screw fixation of subcapital hip fracture, without complicating feature identified. Electronically Signed   By: Gaylyn Rong M.D.   On: 05/19/2015 15:39   Dg Hip Unilat With Pelvis 2-3 Views Left  05/18/2015  CLINICAL DATA:  Larey Seat this morning with left hip pain EXAM: DG HIP (WITH OR WITHOUT PELVIS) 2-3V LEFT COMPARISON:  None. FINDINGS: Moderate bilateral hip arthritis. Pelvic bones appear to be intact. Although fracture line is not identified there is foreshortening of the left proximal femur with cortical irregularity involving the lateral femoral neck. IMPRESSION: A subtle left femoral neck fracture is suspected. CT left hip would be helpful to better evaluate anatomy.  Electronically Signed   By: Edgar Frisk.D.  On: 05/18/2015 15:50     ASSESSMENT AND PLAN:   Active Problems:   Hip fracture (HCC)   1. IMPACTED left femoral neck fracture: Status post ORIF yesterday. A lot of pain issues continue pain medication, advised to use incentive spirometry. Restart the Coumadin tonight. Discharged to skilled nursing facility tomorrow.   #2,paroxysmal atrial fibrillation: It is post vitamin K for surgery. Restart the Coumadin today. Rate controlled. 3. History of ICD, PPM placement. History of ischemic cardiomyopathy #4 Diabetes mellitus with diabetic neuropathy: metformin, sliding scale with coverage after surgery. 5 GI, DVT prophylaxis.    All the records are reviewed and case discussed with Care Management/Social Workerr. Management plans discussed with the patient, family and they are in agreement.  CODE STATUS: *full  TOTAL TIME TAKING CARE OF THIS PATIENT: 35minutes.   POSSIBLE D/C IN 1-2 DAYS, DEPENDING ON CLINICAL CONDITION.   Katha HammingKONIDENA,Marlinda Miranda M.D on 05/20/2015 at 1:15 PM  Between 7am to 6pm - Pager - [phone]  After 6pm go to [email or web address] - password [email or web address] [facility]  Office  [phone]  CC: Primary care physician; Sula RumpleVirk, Charanjit, MD

## 2015-05-20 NOTE — Care Management Important Message (Signed)
Important Message  Patient Details  Name: Riley BimlerMilton Tramell Jr. MRN: [id number] Date of Birth: 1929/10/23   Medicare Important Message Given:  Yes    Collie Siadngela Vernette Moise, RN 05/20/2015, 8:30 AM

## 2015-05-20 NOTE — Discharge Instructions (Signed)

## 2015-05-20 NOTE — Anesthesia Postprocedure Evaluation (Signed)
  Anesthesia Post-op Note  Patient: Riley BimlerMilton Bruning Jr.  Procedure(s) Performed: Procedure(s): CANNULATED HIP PINNING (Left)  Anesthesia type:General  Patient location: PACU  Post pain: Pain level controlled  Post assessment: Post-op Vital signs reviewed, Patient's Cardiovascular Status Stable, Respiratory Function Stable, Patent Airway and No signs of Nausea or vomiting  Post vital signs: Reviewed and stable  Last Vitals:  Filed Vitals:   05/20/15 0745  BP: 118/57  Pulse: 77  Temp: 36.8 C  Resp: 16    Level of consciousness: awake, alert  and patient cooperative  Complications: No apparent anesthesia complications

## 2015-05-20 NOTE — Clinical Social Work Placement (Signed)
   CLINICAL SOCIAL WORK PLACEMENT  NOTE  Date:  05/20/2015  Patient Details  Name: Riley BimlerMilton Auld Jr. MRN: [id number] Date of Birth: [birth year]  Clinical Social Work is seeking post-discharge placement for this patient at the Skilled  Nursing Facility level of care (*CSW will initial, date and re-position this form in  chart as items are completed):  Yes   Patient/family provided with [facility] Clinical Social Work Department's list of facilities offering this level of care within the geographic area requested by the patient (or if unable, by the patient's family).  Yes   Patient/family informed of their freedom to choose among providers that offer the needed level of care, that participate in Medicare, Medicaid or managed care program needed by the patient, have an available bed and are willing to accept the patient.  Yes   Patient/family informed of [facility]'s ownership interest in [facility] and [facility], as well as of the fact that they are under no obligation to receive care at these facilities.  PASRR submitted to EDS on 05/20/15     PASRR number received on 05/20/15     Existing PASRR number confirmed on       FL2 transmitted to all facilities in geographic area requested by pt/family on 05/20/15     FL2 transmitted to all facilities within larger geographic area on       Patient informed that his/her managed care company has contracts with or will negotiate with certain facilities, including the following:            Patient/family informed of bed offers received.  Patient chooses bed at       Physician recommends and patient chooses bed at      Patient to be transferred to   on  .  Patient to be transferred to facility by       Patient family notified on   of transfer.  Name of family member notified:        PHYSICIAN Please sign FL2     Additional Comment:    _______________________________________________ Haig ProphetMorgan, Kaysa Roulhac G,  LCSW 05/20/2015, 3:38 PM

## 2015-05-20 NOTE — Evaluation (Addendum)
Occupational Therapy Evaluation Patient Details Name: Riley Sanchez. MRN: [id number] DOB: [birth year] Today's Date: 05/20/2015    History of Present Illness 81 year old male who came to [facility] after a fall suffering a L hip fracture. He recieved an ORIF repair.   Clinical Impression   This patient is an 81 year old male who came to [facility] after a fall suffering a L hip fracture. She received an open reduction with internal fixation  repair. He hand been independent with ADL and functional mobility and works as a Museum/gallery curator.  He now requires assist and would benefit from Occupational Therapy for ADL/functioal mobility training .     Follow Up Recommendations  SNF    Equipment Recommendations       Recommendations for Other Services       Precautions / Restrictions Restrictions Weight Bearing Restrictions: Yes LLE Weight Bearing: Non weight bearing      Mobility Bed Mobility                  Transfers                      Balance                                            ADL                                         General ADL Comments: Had been independent and works as a Museum/gallery curator. Practiced lower body dressing techniques using hip kit with hand over hand assist.      Vision     Perception     Praxis      Pertinent Vitals/Pain       Hand Dominance     Extremity/Trunk Assessment Upper Extremity Assessment Upper Extremity Assessment: Overall WFL for tasks assessed           Communication     Cognition Arousal/Alertness: Awake/alert Behavior During Therapy: WFL for tasks assessed/performed Overall Cognitive Status: Within Functional Limits for tasks assessed                     General Comments       Exercises       Shoulder Instructions      Home Living Family/patient expects to be discharged to:: Private residence Living Arrangements: Spouse/significant  other   Type of Home: House Home Access: Stairs to enter [employer] of Steps: 2 with rail   Home Layout: One level     Bathroom Shower/Tub: Tub/shower unit                    Prior Functioning/Environment Level of Independence: Independent        Comments: working as a Electrical engineer Diagnosis: Acute pain   OT Problem List: Decreased strength;Decreased range of motion;Decreased activity tolerance;Impaired balance (sitting and/or standing);Pain   OT Treatment/Interventions: Self-care/ADL training    OT Goals(Current goals can be found in the care plan section) Acute Rehab OT Goals Patient Stated Goal: To go home OT Goal Formulation: With patient Time For Goal Achievement: 06/03/15 Potential to Achieve Goals: Good  OT Frequency: Min 1X/week   Barriers to D/C:  Co-evaluation              End of Session Equipment Utilized During Treatment:  (Hip kit)  Activity Tolerance:   Patient left: in bed;with call bell/phone within reach;with bed alarm set   Time: 2867-5198 OT Time Calculation (min): 14 min Charges:  OT General Charges $OT Visit: 1 Procedure OT Evaluation $Initial OT Evaluation Tier I: 1 Procedure G-Codes:    Myrene Galas, MS/OTR/L  05/20/2015, 10:42 AM

## 2015-05-20 NOTE — Progress Notes (Signed)
ANTICOAGULATION CONSULT NOTE - Initial Consult  Pharmacy Consult for Coumadin Indication: atrial fibrillation  Allergies  Allergen Reactions  . Bactrim [Sulfamethoxazole-Trimethoprim] Other (See Comments)    Reaction:  Unknown     Patient Measurements: Height: 5\' 11"  (180.3 cm) Weight: 178 lb (80.74 kg) IBW/kg (Calculated) : 75.3   Vital Signs: Temp: 98.5 F (36.9 C) (11/16 0421) Temp Source: Oral (11/16 0421) BP: 132/56 mmHg (11/16 0421) Pulse Rate: 69 (11/16 0421)  Labs:  Recent Labs  05/18/15 1550 05/19/15 0610 05/19/15 0843 05/20/15 0607  HGB 12.4*  --   --  10.8*  HCT 37.6*  --   --  31.8*  PLT 210  --   --  150  APTT 43*  --   --   --   LABPROT 27.5* 19.9* 18.9* 17.6*  INR 2.60 1.69 1.58 1.44  CREATININE 0.86  --   --  0.79    Estimated Creatinine Clearance: 74.5 mL/min (by C-G formula based on Cr of 0.79).   Medical History: Past Medical History  Diagnosis Date  . Diabetes mellitus without complication (HCC)   . Hypertension     Medications:  Scheduled:  . aspirin EC  81 mg Oral Daily  . cholecalciferol  1,000 Units Oral Daily  . docusate sodium  100 mg Oral BID  . ferrous sulfate  325 mg Oral TID PC  . gabapentin  100 mg Oral QHS  . insulin aspart  0-15 Units Subcutaneous TID WC  . lisinopril  10 mg Oral Daily  . metFORMIN  1,000 mg Oral BID WC  . pantoprazole  40 mg Oral Daily  . pravastatin  40 mg Oral q1800  . tamsulosin  0.4 mg Oral BID  . warfarin  5 mg Oral Daily   Infusions:  . sodium chloride 75 mL/hr (05/19/15 2257)    Assessment: 81 y/o M with a h/o PAF on Coumadin 5 mg daily PTA with therapeutic INR on admission. Patient received 10 mg iv vitamin k on 11/14. Pt resumed warfarin last evening 11/15. INR continues to drop due to vit K effects.   Goal of Therapy:  INR 2-3  Plan:  Since we are fighting the effects of vit k, will boost the patient with 7.5mg  tonight. Recheck INR in the AM.   Verdie Barrows D Jaylee Lantry 05/20/2015,7:29  AM

## 2015-05-20 NOTE — Progress Notes (Signed)
Physical Therapy Treatment Patient Details Name: Riley BimlerMilton Causey Jr. MRN: [id number] DOB: [birth year] Today's Date: 05/20/2015    History of Present Illness Pt is an 81 y.o. male s/p mechanical fall suffering L femoral neck hip fx.  Pt s/p 05/19/15 percutaneous cannulated screw fixation of L femoral neck hip fx.  PMH includes LE neuropathy, paroxysmal a-fib on warfarin, chronic systolic CHF, biventricular ICD.    PT Comments    PT called by nursing to assist pt back to bed d/t pt c/o significant L hip pain in chair.  Pt still requiring significant assist to maintain NWB'ing status and for transfers/functional mobility.  Will continue to progress pt per pt tolerance with functional mobility.  Follow Up Recommendations  SNF     Equipment Recommendations       Recommendations for Other Services       Precautions / Restrictions Precautions Precautions: Fall Restrictions Weight Bearing Restrictions: Yes LLE Weight Bearing: Non weight bearing    Mobility  Bed Mobility Overal bed mobility: Needs Assistance;+2 for physical assistance Bed Mobility: Sit to Supine     Supine to sit:  (deferred d/t pt already up in chair) Sit to supine: Mod assist;+2 for physical assistance   General bed mobility comments: assist for trunk and B LE's  Transfers Overall transfer level: Needs assistance Equipment used: None Transfers: Stand Pivot Transfers   Stand pivot transfers: Max assist;+2 physical assistance (stand pivot transfer recliner to bed to R)       General transfer comment: pt requiring assist to maintain NWB LE LE  Ambulation/Gait             General Gait Details: not appropriate at this time d/t significant assist levels and difficulty maintaining WB'ing precautions   Stairs            Wheelchair Mobility    Modified Rankin (Stroke Patients Only)       Balance Overall balance assessment: Needs assistance Sitting-balance support: Bilateral upper extremity  supported;Feet supported Sitting balance-Leahy Scale: Fair Sitting balance - Comments: mild R lean d/t L hip pain     Standing balance-Leahy Scale: Poor                      Cognition Arousal/Alertness: Awake/alert Behavior During Therapy: WFL for tasks assessed/performed Overall Cognitive Status: Within Functional Limits for tasks assessed                      Exercises   Performed semi-supine B LE therapeutic exercise x 10 reps:  Ankle pumps (AROM B LE's); quad sets x3 second holds (AROM R LE; trace contraction AROM L LE); glute squeezes x3 second holds (AROM B); heelslides (AAROM R; AAROM L-mild improved range vs morning session), hip abd/adduction (AAROM R; AAROM L-mild improved range vs morning session).  Pt required vc's and tactile cues for correct technique with exercises.     General Comments General comments (skin integrity, edema, etc.): L hip dressings in place  Nursing cleared pt for participation in physical therapy.  Pt agreeable to PT session.      Pertinent Vitals/Pain Pain Assessment: 0-10 Pain Score:  (1.5/10 at rest; 10/10 with activity/movement) Pain Location: L hip Pain Descriptors / Indicators: Sore;Tender;Aching;Constant;Sharp;Shooting Pain Intervention(s): Limited activity within patient's tolerance;Monitored during session;Repositioned    Home Living                      Prior Function  PT Goals (current goals can now be found in the care plan section) Acute Rehab PT Goals Patient Stated Goal: to get better PT Goal Formulation: With patient Time For Goal Achievement: 06/03/15 Potential to Achieve Goals: Good Progress towards PT goals: Progressing toward goals    Frequency  BID    PT Plan Current plan remains appropriate    Co-evaluation             End of Session Equipment Utilized During Treatment: Gait belt;Oxygen Activity Tolerance: Patient limited by pain Patient left: in bed;with call  bell/phone within reach;with bed alarm set;with SCD's reapplied (B heels elevated via towel roll)     Time: 1345-1415 PT Time Calculation (min) (ACUTE ONLY): 30 min  Charges:  $Therapeutic Exercise: 8-22 mins $Therapeutic Activity: 8-22 mins                    G CodesHendricks Limes June 13, 2015, 4:31 PM Hendricks Limes, PT [phone]

## 2015-05-21 LAB — BASIC METABOLIC PANEL
Anion gap: 9 (ref 5–15)
BUN: 12 mg/dL (ref 6–20)
CHLORIDE: 106 mmol/L (ref 101–111)
CO2: 21 mmol/L — AB (ref 22–32)
Calcium: 7.9 mg/dL — ABNORMAL LOW (ref 8.9–10.3)
Creatinine, Ser: 0.76 mg/dL (ref 0.61–1.24)
GFR calc Af Amer: >60 mL/min (ref >60)
GFR calc non Af Amer: >60 mL/min (ref >60)
Glucose, Bld: 136 mg/dL — ABNORMAL HIGH (ref 65–99)
POTASSIUM: 4.1 mmol/L (ref 3.5–5.1)
SODIUM: 136 mmol/L (ref 135–145)

## 2015-05-21 LAB — CBC
HEMATOCRIT: 31.9 % — AB (ref 40.0–52.0)
HEMOGLOBIN: 10.7 g/dL — AB (ref 13.0–18.0)
MCH: 29.9 pg (ref 26.0–34.0)
MCHC: 33.4 g/dL (ref 32.0–36.0)
MCV: 89.6 fL (ref 80.0–100.0)
Platelets: 149 10*3/uL — ABNORMAL LOW (ref 150–440)
RBC: 3.56 MIL/uL — AB (ref 4.40–5.90)
RDW: 13.1 % (ref 11.5–14.5)
WBC: 9.2 10*3/uL (ref 3.8–10.6)

## 2015-05-21 LAB — GLUCOSE, CAPILLARY
GLUCOSE-CAPILLARY: 127 mg/dL — AB (ref 65–99)
Glucose-Capillary: 139 mg/dL — ABNORMAL HIGH (ref 65–99)
Glucose-Capillary: 131 mg/dL — ABNORMAL HIGH (ref 65–99)
Glucose-Capillary: 133 mg/dL — ABNORMAL HIGH (ref 65–99)

## 2015-05-21 LAB — PROTIME-INR
INR: 1.35
PROTHROMBIN TIME: 16.8 s — AB (ref 11.4–15.0)

## 2015-05-21 MED ORDER — WARFARIN SODIUM 5 MG PO TABS
10.0000 mg | ORAL_TABLET | Freq: Once | ORAL | Status: AC
  Administered 2015-05-21: 10 mg via ORAL
  Filled 2015-05-21: qty 2

## 2015-05-21 NOTE — Progress Notes (Signed)
[facility] - [place] at [facility]   PATIENT NAME: Riley Sanchez    MR#:  [id number]  DATE OF BIRTH:  [birth year]  Patient says that he has left hip pain even with minimal movement. Constipated with no BM for 4 days. History of left hip fracture status post surgery day 2.   CHIEF COMPLAINT:   Chief Complaint  Patient presents with  . Fall  . Hip Pain    REVIEW OF SYSTEMS:    Review of Systems  Constitutional: Negative for fever.  Eyes: Negative for blurred vision.  Respiratory: Negative for cough.   Cardiovascular: Negative for chest pain.  Gastrointestinal: Negative for heartburn.  Genitourinary: Negative for dysuria.  Musculoskeletal: Negative for myalgias.       Left hip pain  Neurological: Negative for dizziness, speech change and headaches.  Endo/Heme/Allergies: Does not bruise/bleed easily.    Nutrition:  Tolerating Diet: Tolerating PT:      DRUG ALLERGIES:   Allergies  Allergen Reactions  . Bactrim [Sulfamethoxazole-Trimethoprim] Other (See Comments)    Reaction:  Unknown     VITALS:  Blood pressure 139/49, pulse 62, temperature 98.4 F (36.9 C), temperature source Oral, resp. rate 18, height  (1.803 m), weight 80.74 kg (178 lb), SpO2 95 %.  PHYSICAL EXAMINATION:   Physical Exam  GENERAL:  81 y.o.-year-old patient lying in the bed with no acute distress.  EYES: Pupils equal, round, reactive to light and accommodation. No scleral icterus. Extraocular muscles intact.  HEENT: Head atraumatic, normocephalic. Oropharynx and nasopharynx clear.  NECK:  Supple, no jugular venous distention. No thyroid enlargement, no tenderness.  LUNGS: Normal breath sounds bilaterally, no wheezing, rales,rhonchi or crepitation. No use of accessory muscles of respiration.  CARDIOVASCULAR: S1, S2 normal. No murmurs, rubs, or gallops.  ABDOMEN: Soft, nontender, nondistended. Bowel sounds present. No organomegaly or mass.  EXTREMITIES: Severe  left thigh pain. Staples intact. No swelling. Sensations in the legs secondary to diabetic neuropathy. Peripheral  pulses are intact. NEUROLOGIC: Cranial nerves II through XII are intact. Muscle strength 5/5 in all extremities. Sensation intact. Gait not checked.  PSYCHIATRIC: The patient is alert and oriented x 3.  SKIN: No obvious rash, lesion, or ulcer.    LABORATORY PANEL:   CBC  Recent Labs Lab 05/21/15 0454  WBC 9.2  HGB 10.7*  HCT 31.9*  PLT 149*   ------------------------------------------------------------------------------------------------------------------  Chemistries   Recent Labs Lab 05/21/15 0454  NA 136  K 4.1  CL 106  CO2 21*  GLUCOSE 136*  BUN 12  CREATININE 0.76  CALCIUM 7.9*   ------------------------------------------------------------------------------------------------------------------  Cardiac Enzymes No results for input(s): TROPONINI in the last 168 hours. ------------------------------------------------------------------------------------------------------------------  RADIOLOGY:  Dg Hip Port Unilat With Pelvis 1v Left  05/19/2015  CLINICAL DATA:  ORIF traumatic subcapital left femoral neck fracture. EXAM: DG HIP (WITH OR WITHOUT PELVIS) 1V PORT LEFT COMPARISON:  Preoperative x-rays and CT yesterday. FINDINGS: ORIF of the subcapital left femoral neck fracture with 3 cannulated screws. Alignment appears anatomic. No acute complicating features. Moderate symmetric joint space narrowing in both hips again noted. IMPRESSION: Anatomic alignment post ORIF of the subcapital left femoral neck fracture without acute complicating features. Electronically Signed   By: Hulan Saas M.D.   On: 05/19/2015 13:20   Dg Hip Operative Unilat With Pelvis Left  05/19/2015  CLINICAL DATA:  Left femoral neck fracture, cannulated screw fixation. EXAM: OPERATIVE left HIP (WITH PELVIS IF PERFORMED) 3 VIEWS TECHNIQUE: Fluoroscopic spot image(s) were submitted for  interpretation post-operatively. COMPARISON:  05/18/2015 FINDINGS: Three intraoperative spot images of the left hip demonstrate 3 cannulated screws extending axially through the hip, with threaded portions believed to be just distal to the subcapital fracture. IMPRESSION: 1. Cannulated screw fixation of subcapital hip fracture, without complicating feature identified. Electronically Signed   By: Gaylyn RongWalter  Liebkemann M.D.   On: 05/19/2015 15:39     ASSESSMENT AND PLAN:   Active Problems:   Hip fracture (HCC)   1. IMPACTED left femoral neck fracture: Status post ORIF  on November 15.. A lot of pain issues continue pain medication, advised to use incentive spirometry. Restarted the Coumadin tonig. Discharged to skilled nursing facility tomorrow Encouraged to work with physical therapy.   #2,paroxysmal atrial fibrillation: It is post vitamin K for surgery. Restart the Coumadin today. Rate controlled. 3. History of ICD, PPM placement. History of ischemic cardiomyopathy #4 Diabetes mellitus with diabetic neuropathy: continue metformin, sliding scale with coverage. Continue Neurontin for neuropathy.  5 GI, DVT prophylaxis. #6 constipation;Colace, senna are ordered     All the records are reviewed and case discussed with Care Management/Social Workerr. Management plans discussed with the patient, family and they are in agreement.  CODE STATUS: *full  TOTAL TIME TAKING CARE OF THIS PATIENT: 35minutes.   POSSIBLE D/C IN 1-2 DAYS, DEPENDING ON CLINICAL CONDITION.   Katha HammingKONIDENA,Juliauna Stueve M.D on 05/21/2015 at 8:42 AM  Between 7am to 6pm - Pager - [phone]  After 6pm go to [email or web address] - password [email or web address] [facility]  [facility]  Office  [phone]  CC: Primary care physician; Sula RumpleVirk, Charanjit, MD

## 2015-05-21 NOTE — Progress Notes (Signed)
oob to chiar today. nwb on lle. Pain relief with oxycodone and tylenol. Neuro checks wnl

## 2015-05-21 NOTE — Progress Notes (Signed)
ANTICOAGULATION CONSULT NOTE - Initial Consult  Pharmacy Consult for Coumadin Indication: atrial fibrillation  Allergies  Allergen Reactions  . Bactrim [Sulfamethoxazole-Trimethoprim] Other (See Comments)    Reaction:  Unknown     Patient Measurements: Height: 5\' 11"  (180.3 cm) Weight: 178 lb (80.74 kg) IBW/kg (Calculated) : 75.3   Vital Signs: Temp: 98.6 F (37 C) (11/17 0447) Temp Source: Oral (11/17 0447) BP: 152/56 mmHg (11/17 0447) Pulse Rate: 63 (11/16 2003)  Labs:  Recent Labs  05/18/15 1550  05/19/15 0843 05/20/15 0607 05/21/15 0454  HGB 12.4*  --   --  10.8* 10.7*  HCT 37.6*  --   --  31.8* 31.9*  PLT 210  --   --  150 149*  APTT 43*  --   --   --   --   LABPROT 27.5*  < > 18.9* 17.6* 16.8*  INR 2.60  < > 1.58 1.44 1.35  CREATININE 0.86  --   --  0.79 0.76  < > = values in this interval not displayed.  Estimated Creatinine Clearance: 74.5 mL/min (by C-G formula based on Cr of 0.76).   Medical History: Past Medical History  Diagnosis Date  . Diabetes mellitus without complication (HCC)   . Hypertension     Medications:  Scheduled:  . aspirin EC  81 mg Oral Daily  . cholecalciferol  1,000 Units Oral Daily  . docusate sodium  100 mg Oral BID  . ferrous sulfate  325 mg Oral TID PC  . gabapentin  100 mg Oral QHS  . insulin aspart  0-15 Units Subcutaneous TID WC  . lisinopril  10 mg Oral Daily  . metFORMIN  1,000 mg Oral BID WC  . pantoprazole  40 mg Oral Daily  . pravastatin  40 mg Oral q1800  . tamsulosin  0.4 mg Oral BID  . Warfarin - Pharmacist Dosing Inpatient   Does not apply q1800   Infusions:  . sodium chloride 75 mL/hr (05/19/15 2257)    Assessment: 81 y/o M with a h/o PAF on Coumadin 5 mg daily PTA with therapeutic INR on admission. Patient received 10 mg iv vitamin k on 11/14. Pt resumed warfarin last evening 11/15. INR continues to drop due to vit K effects.  10/15=1.58 10/16=1.44 10/17=1.35  Goal of Therapy:  INR  2-3  Plan:  INR is still dropping. Will give 10mg  tonight and recheck in the AM.   Olene FlossMelissa D Anni Hocevar, Pharm.D Clinical Pharmacist  05/21/2015,7:35 AM

## 2015-05-21 NOTE — Progress Notes (Signed)
Clinical Education officer, museum (CSW) met with patient and his wife at bedside. CSW presented bed offers. They chose [facility]. CSW contacted [facility] and spoke with Colletta [place] in admissions and made her aware of accepted bed offer. Plan is for patient to D/C to [facility] tomorrow. CSW will continue to follow and assist as needed.   Blima Rich, [place] [phone]

## 2015-05-21 NOTE — Progress Notes (Signed)
Physical Therapy Treatment Patient Details Name: Riley Sanchez. MRN: [id number] DOB: [birth year] Today's Date: 05/21/2015    History of Present Illness Pt is an 81 y.o. male s/p mechanical fall suffering L femoral neck hip fx.  Pt s/p 05/19/15 percutaneous cannulated screw fixation of L femoral neck hip fx.  PMH includes LE neuropathy, paroxysmal a-fib on warfarin, chronic systolic CHF, biventricular ICD.    PT Comments    Pt able to progress to standing and stand pivot transfer with RW bed to recliner this morning with 2 assist.  Pt still requiring significant assist to come to full stand and to maintain NWB'ing status with 2 assist.  Continue to recommend pt discharge to STR when medically appropriate.   Follow Up Recommendations  SNF     Equipment Recommendations       Recommendations for Other Services       Precautions / Restrictions Precautions Precautions: Fall Restrictions Weight Bearing Restrictions: Yes LLE Weight Bearing: Non weight bearing    Mobility  Bed Mobility Overal bed mobility: Needs Assistance Bed Mobility: Supine to Sit     Supine to sit: Mod assist;Max assist;+2 for physical assistance;HOB elevated     General bed mobility comments: assist for trunk and B LE's, vc's for technique required  Transfers Overall transfer level: Needs assistance Equipment used: Rolling walker (2 wheeled) Transfers: Sit to/from Stand Sit to Stand: From elevated surface;Max assist;+2 physical assistance Stand pivot transfers: Max assist;+2 physical assistance;From elevated surface       General transfer comment: pt requiring assist to maintain NWB LE LE; vc's required and increased time required for upright posture; max vc's required for safe transfer bed to recliner  Ambulation/Gait             General Gait Details: not appropriate at this time d/t significant assist levels and difficulty maintaining WB'ing precautions   Stairs             Wheelchair Mobility    Modified Rankin (Stroke Patients Only)       Balance Overall balance assessment: Needs assistance Sitting-balance support: Bilateral upper extremity supported;Feet supported Sitting balance-Leahy Scale: Fair Sitting balance - Comments: R lean d/t L hip pain Postural control: Right lateral lean   Standing balance-Leahy Scale: Poor Standing balance comment: use of RW                    Cognition Arousal/Alertness: Awake/alert Behavior During Therapy: WFL for tasks assessed/performed Overall Cognitive Status: Within Functional Limits for tasks assessed                      Exercises   Performed semi-supine B LE therapeutic exercise x 10 reps:  Ankle pumps (AROM B LE's); quad sets x3 second holds (AROM R LE; AAROM L LE d/t trace contraction); glute squeezes x3 second holds (AROM B); heelslides (AROM R; AAROM L), hip abd/adduction (AROM R; AAROM L).  Pt required vc's and tactile cues for correct technique with exercises.     General Comments General comments (skin integrity, edema, etc.): L hip dressings in place  Nursing cleared pt for participation in physical therapy.  Pt agreeable to PT session.  Pt's wife present during the session.      Pertinent Vitals/Pain Pain Assessment: No/denies pain Pain Score:  (0/10 at rest; 10/10 with activity) Pain Location: L hip Pain Descriptors / Indicators: Sore;Tender;Aching;Constant;Sharp;Shooting Pain Intervention(s): Limited activity within patient's tolerance;Monitored during session;Premedicated before session;Repositioned  Vitals stable and [facility]  throughout treatment session.    Home Living                      Prior Function            PT Goals (current goals can now be found in the care plan section) Acute Rehab PT Goals Patient Stated Goal: to get better PT Goal Formulation: With patient Time For Goal Achievement: 06/03/15 Potential to Achieve Goals: Good Progress towards  PT goals: Progressing toward goals    Frequency  BID    PT Plan Current plan remains appropriate    Co-evaluation             End of Session Equipment Utilized During Treatment: Gait belt;Oxygen Activity Tolerance: Patient limited by pain Patient left: in chair;with call bell/phone within reach;with chair alarm set;with family/visitor present;with SCD's reapplied (B heels elevated)     Time: [id number] PT Time Calculation (min) (ACUTE ONLY): 38 min  Charges:  $Therapeutic Exercise: 8-22 mins $Therapeutic Activity: 23-37 mins                    G CodesHendricks Limes:      Robertine Kipper 05/21/2015, 11:41 AM Hendricks LimesEmily Cederick Broadnax, PT [phone]

## 2015-05-21 NOTE — Progress Notes (Signed)
Physical Therapy Treatment Patient Details Name: Riley Sanchez. MRN: [id number] DOB: [birth year] Today's Date: 05/21/2015    History of Present Illness Pt is an 81 y.o. male s/p mechanical fall suffering L femoral neck hip fx.  Pt s/p 05/19/15 percutaneous cannulated screw fixation of L femoral neck hip fx.  PMH includes LE neuropathy, paroxysmal a-fib on warfarin, chronic systolic CHF, biventricular ICD.    PT Comments    Pt appearing more fatigued this afternoon requiring increased assist to stand with RW and maintain NWB'ing status (with 2 assist).  Pt on commode on arrival and assisted back to bed.  With a lot of vc's and tactile cues pt able to improve L quad contraction.  Plan for discharge to STR tomorrow.   Follow Up Recommendations  SNF     Equipment Recommendations       Recommendations for Other Services       Precautions / Restrictions Precautions Precautions: Fall Restrictions Weight Bearing Restrictions: Yes LLE Weight Bearing: Non weight bearing    Mobility  Bed Mobility Overal bed mobility: Needs Assistance Bed Mobility: Sit to Supine     Supine to sit:  (deferred d/t pt already out of bed) Sit to supine: Mod assist;+2 for physical assistance   General bed mobility comments: assist for trunk and B LE's, vc's for technique required  Transfers Overall transfer level: Needs assistance Equipment used: Rolling walker (2 wheeled) Transfers: Sit to/from Stand Sit to Stand: Max assist;From elevated surface (from bedside commode) Stand pivot transfers:  (pt unable to maintain WB'ing precautions without significant assist so bed brought behind pt for pt to safely sit on)       General transfer comment: pt requiring assist to maintain NWB LE LE; vc's required and increased time required for upright posture  Ambulation/Gait             General Gait Details: not appropriate at this time d/t significant assist levels and difficulty maintaining WB'ing  precautions   Stairs            Wheelchair Mobility    Modified Rankin (Stroke Patients Only)       Balance Overall balance assessment: Needs assistance Sitting-balance support: Bilateral upper extremity supported;Feet supported Sitting balance-Leahy Scale: Fair Sitting balance - Comments: mild R lean d/t L hip pain Postural control: Right lateral lean Standing balance support: Bilateral upper extremity supported (on RW) Standing balance-Leahy Scale: Poor Standing balance comment: posterior lean                    Cognition Arousal/Alertness: Awake/alert Behavior During Therapy: WFL for tasks assessed/performed Overall Cognitive Status: Within Functional Limits for tasks assessed                      Exercises   Performed semi-supine B LE therapeutic exercise x 12 reps:  Ankle pumps (AROM B LE's); quad sets x3 second holds (AROM R LE; AAROM L LE); glute squeezes x3 second holds (AROM B);  heelslides (AROM R; AAROM L), hip abd/adduction (AROM R; AAROM L).  Pt required vc's and tactile cues for correct technique with exercises.     General Comments General comments (skin integrity, edema, etc.): L hip dressings in place      Pertinent Vitals/Pain Pain Assessment: 0-10 Pain Score:  (0/10 at rest; 10/10 with activity) Pain Location: L hip Pain Descriptors / Indicators: Sore;Tender;Aching;Sharp;Shooting Pain Intervention(s): Limited activity within patient's tolerance;Monitored during session;Premedicated before session;Repositioned    Home Living  Prior Function            PT Goals (current goals can now be found in the care plan section) Acute Rehab PT Goals Patient Stated Goal: to get better PT Goal Formulation: With patient Time For Goal Achievement: 06/03/15 Potential to Achieve Goals: Good Progress towards PT goals: Progressing toward goals    Frequency  BID    PT Plan Current plan remains appropriate     Co-evaluation             End of Session Equipment Utilized During Treatment: Gait belt Activity Tolerance: Patient limited by pain;Patient limited by fatigue Patient left: in bed;with call bell/phone within reach;with bed alarm set;with family/visitor present;with SCD's reapplied (B heels elevated)     Time: 1340-1404 PT Time Calculation (min) (ACUTE ONLY): 24 min  Charges:  $Therapeutic Exercise: 8-22 mins $Therapeutic Activity: 8-22 mins                    G CodesHendricks Limes:      Kindred Heying 05/21/2015, 3:03 PM Hendricks LimesEmily Malaia Buchta, PT [phone]

## 2015-05-21 NOTE — Progress Notes (Signed)
Subjective:  POD #2  status post left femoral neck hip fracture. Patient is up out of bed to a chair. Patient reports pain as mild.  He feels much better today. Patient has no complaints.  Objective:   VITALS:   Filed Vitals:   05/20/15 1547 05/20/15 2003 05/21/15 0447 05/21/15 0751  BP: 131/58 128/45 152/56 139/49  Pulse: 88 63  62  Temp: 99.7 F (37.6 C) 99.1 F (37.3 C) 98.6 F (37 C) 98.4 F (36.9 C)  TempSrc: Oral Oral Oral Oral  Resp: Height:      Weight:      SpO2: 94% 95% 95% 95%    PHYSICAL EXAM:  Left lower extremity/hip:  Dressing remains clean dry and intact. Patient has no erythema or ecchymosis or thigh swelling. His thigh compartments are soft and compressible. He has palpable pedal pulses and intact motor function in the left lower extremity. His sensation is at baseline which is diminished sensation to light touch due to peripheral neuropathy.   LABS  Results for orders placed or performed during the hospital encounter of 05/18/15 (from the past 24 hour(s))  Glucose, capillary     Status: Abnormal   Collection Time: 05/20/15 12:12 PM  Result Value Ref Range   Glucose-Capillary 122 (H) 65 - 99 mg/dL   Comment 1 Notify RN   Glucose, capillary     Status: Abnormal   Collection Time: 05/20/15  3:48 PM  Result Value Ref Range   Glucose-Capillary 100 (H) 65 - 99 mg/dL   Comment 1 Notify RN   Glucose, capillary     Status: Abnormal   Collection Time: 05/20/15  9:22 PM  Result Value Ref Range   Glucose-Capillary 106 (H) 65 - 99 mg/dL   Comment 1 Notify RN   CBC     Status: Abnormal   Collection Time: 05/21/15  4:54 AM  Result Value Ref Range   WBC 9.2 3.8 - 10.6 K/uL   RBC 3.56 (L) 4.40 - 5.90 MIL/uL   Hemoglobin 10.7 (L) 13.0 - 18.0 g/dL   HCT 81.1 (L) 91.4 - 78.2 %   MCV 89.6 80.0 - 100.0 fL   MCH 29.9 26.0 - 34.0 pg   MCHC 33.4 32.0 - 36.0 g/dL   RDW 95.6 21.3 - 08.6 %   Platelets 149 (L) 150 - 440 K/uL  Basic metabolic panel      Status: Abnormal   Collection Time: 05/21/15  4:54 AM  Result Value Ref Range   Sodium 136 135 - 145 mmol/L   Potassium 4.1 3.5 - 5.1 mmol/L   Chloride 106 101 - 111 mmol/L   CO2 21 (L) 22 - 32 mmol/L   Glucose, Bld 136 (H) 65 - 99 mg/dL   BUN 12 6 - 20 mg/dL   Creatinine, Ser 5.78 0.61 - 1.24 mg/dL   Calcium 7.9 (L) 8.9 - 10.3 mg/dL   GFR calc non Af Amer >60 >60 mL/min   GFR calc Af Amer >60 >60 mL/min   Anion gap 9 5 - 15  Protime-INR     Status: Abnormal   Collection Time: 05/21/15  4:54 AM  Result Value Ref Range   Prothrombin Time 16.8 (H) 11.4 - 15.0 seconds   INR 1.35   Glucose, capillary     Status: Abnormal   Collection Time: 05/21/15  7:52 AM  Result Value Ref Range   Glucose-Capillary 139 (H) 65 - 99 mg/dL   Comment 1 Notify  RN   Glucose, capillary     Status: Abnormal   Collection Time: 05/21/15 11:26 AM  Result Value Ref Range   Glucose-Capillary 127 (H) 65 - 99 mg/dL   Comment 1 Notify RN     Dg Hip Port Unilat With Pelvis 1v Left  05/19/2015  CLINICAL DATA:  ORIF traumatic subcapital left femoral neck fracture. EXAM: DG HIP (WITH OR WITHOUT PELVIS) 1V PORT LEFT COMPARISON:  Preoperative x-rays and CT yesterday. FINDINGS: ORIF of the subcapital left femoral neck fracture with 3 cannulated screws. Alignment appears anatomic. No acute complicating features. Moderate symmetric joint space narrowing in both hips again noted. IMPRESSION: Anatomic alignment post ORIF of the subcapital left femoral neck fracture without acute complicating features. Electronically Signed   By: Hulan Saashomas  Lawrence M.D.   On: 05/19/2015 13:20   Dg Hip Operative Unilat With Pelvis Left  05/19/2015  CLINICAL DATA:  Left femoral neck fracture, cannulated screw fixation. EXAM: OPERATIVE left HIP (WITH PELVIS IF PERFORMED) 3 VIEWS TECHNIQUE: Fluoroscopic spot image(s) were submitted for interpretation post-operatively. COMPARISON:  05/18/2015 FINDINGS: Three intraoperative spot images of the left  hip demonstrate 3 cannulated screws extending axially through the hip, with threaded portions believed to be just distal to the subcapital fracture. IMPRESSION: 1. Cannulated screw fixation of subcapital hip fracture, without complicating feature identified. Electronically Signed   By: Gaylyn RongWalter  Liebkemann M.D.   On: 05/19/2015 15:39    Assessment/Plan: 2 Days Post-Op   Active Problems:   Hip fracture [facility](HCC)  Patient making good progress postop. Patient is back on his Coumadin. His INR however has continued to drop.  Today is 1.35. I spoke with Dr. Darrold JunkerParaschos, the patient's cardiologist today to discuss Lovenox bridging. Dr. Darrold JunkerParaschos does not feel lovenox bridging is necessary. He will continue on Coumadin as ordered. Continue physical and occupational therapy. Encourage incentive spirometry while awake. Recheck INR tomorrow. Plan for discharge to rehabilitation tomorrow.    Juanell FairlyKRASINSKI, Cevin Rubinstein , MD 05/21/2015, 11:56 AM

## 2015-05-22 LAB — CBC
HEMATOCRIT: 31.2 % — AB (ref 40.0–52.0)
Hemoglobin: 10.5 g/dL — ABNORMAL LOW (ref 13.0–18.0)
MCH: 30.1 pg (ref 26.0–34.0)
MCHC: 33.7 g/dL (ref 32.0–36.0)
MCV: 89.5 fL (ref 80.0–100.0)
Platelets: 174 10*3/uL (ref 150–440)
RBC: 3.48 MIL/uL — ABNORMAL LOW (ref 4.40–5.90)
RDW: 13.4 % (ref 11.5–14.5)
WBC: 7.7 10*3/uL (ref 3.8–10.6)

## 2015-05-22 LAB — PROTIME-INR
INR: 1.3
PROTHROMBIN TIME: 16.3 s — AB (ref 11.4–15.0)

## 2015-05-22 LAB — GLUCOSE, CAPILLARY
Glucose-Capillary: 135 mg/dL — ABNORMAL HIGH (ref 65–99)
Glucose-Capillary: 162 mg/dL — ABNORMAL HIGH (ref 65–99)

## 2015-05-22 MED ORDER — ACETAMINOPHEN 325 MG PO TABS: 650.0000 mg | ORAL_TABLET | Freq: Four times a day (QID) | ORAL | Status: DC | PRN

## 2015-05-22 MED ORDER — POLYETHYLENE GLYCOL 3350 17 G PO PACK: 17.0000 g | PACK | Freq: Every day | ORAL | Status: DC | PRN

## 2015-05-22 MED ORDER — WARFARIN SODIUM 5 MG PO TABS: 7.5000 mg | ORAL_TABLET | Freq: Once | ORAL | Status: DC

## 2015-05-22 MED ORDER — BISACODYL 10 MG RE SUPP: 10.0000 mg | Freq: Every day | RECTAL | Status: DC | PRN

## 2015-05-22 MED ORDER — OXYCODONE HCL 5 MG PO TABS: 5.0000 mg | ORAL_TABLET | ORAL | Status: DC | PRN

## 2015-05-22 MED ORDER — FERROUS SULFATE 325 (65 FE) MG PO TABS: 325.0000 mg | ORAL_TABLET | Freq: Three times a day (TID) | ORAL | Status: DC

## 2015-05-22 NOTE — Progress Notes (Signed)
Report called to [facility] [place] Pt [place] for Rehab. Wife accompanied w/ belongings.

## 2015-05-22 NOTE — Progress Notes (Signed)
  Subjective:  Postoperative day 3 status post percutaneous fixation for left femoral neck hip fracture.  the patient was discharged today to [facility].   Objective:   VITALS:   Filed Vitals:   05/21/15 1541 05/21/15 2023 05/22/15 0353 05/22/15 0733  BP: 118/54 128/53 136/59 140/59  Pulse: 74 72 67 71  Temp: 98.6 F (37 C) 98.5 F (36.9 C) 97.5 F (36.4 C) 98.4 F (36.9 C)  TempSrc: Oral Oral Oral Oral  Resp: 18 17 20 17   Height:      Weight:      SpO2: 96% 94% 95% 95%    PHYSICAL EXAM:  Patient discharged morning prior to orthopedic exam. Patient was doing well yesterday had a clean dressing. He was making excellent progress postop.    LABS  Results for orders placed or performed during the hospital encounter of 05/18/15 (from the past 24 hour(s))  Glucose, capillary     Status: Abnormal   Collection Time: 05/21/15  3:53 PM  Result Value Ref Range   Glucose-Capillary 131 (H) 65 - 99 mg/dL   Comment 1 Notify RN   Glucose, capillary     Status: Abnormal   Collection Time: 05/21/15  8:55 PM  Result Value Ref Range   Glucose-Capillary 133 (H) 65 - 99 mg/dL   Comment 1 Notify RN   CBC     Status: Abnormal   Collection Time: 05/22/15  4:38 AM  Result Value Ref Range   WBC 7.7 3.8 - 10.6 K/uL   RBC 3.48 (L) 4.40 - 5.90 MIL/uL   Hemoglobin 10.5 (L) 13.0 - 18.0 g/dL   HCT 16.131.2 (L) 09.640.0 - 04.552.0 %   MCV 89.5 80.0 - 100.0 fL   MCH 30.1 26.0 - 34.0 pg   MCHC 33.7 32.0 - 36.0 g/dL   RDW 40.913.4 81.111.5 - 91.414.5 %   Platelets 174 150 - 440 K/uL  Protime-INR     Status: Abnormal   Collection Time: 05/22/15  4:38 AM  Result Value Ref Range   Prothrombin Time 16.3 (H) 11.4 - 15.0 seconds   INR 1.30   Glucose, capillary     Status: Abnormal   Collection Time: 05/22/15  7:31 AM  Result Value Ref Range   Glucose-Capillary 135 (H) 65 - 99 mg/dL  Glucose, capillary     Status: Abnormal   Collection Time: 05/22/15 11:37 AM  Result Value Ref Range   Glucose-Capillary  162 (H) 65 - 99 mg/dL    No results found.  Assessment/Plan: 3 Days Post-Op   Active Problems:   Hip fracture [facility](HCC)  Patient has done well postoperatively. She has a valgus impacted femoral neck hip fracture which is been surgically stabilized with percutaneous screws. Patient must remain TOE TOUCH WEIGHTBEARING ONLY for the left lower extremity for the next 6 weeks. Patient should continue to elevate the left lower extremity. He may apply ice to the surgical site as needed. Dressing should be changed on a when necessary basis. He'll follow up with me in 10-14 days for wound check, staple removal and x-ray. He will remain on adjusted dose Coumadin which she is on at baseline for paroxysmal atrial fibrillation. The adjusted dose Coumadin will cover him for DVT prophylaxis as well.  Dr. Martha ClanKrasinski at [facility]  should be contacted with any questions or concerns regarding the left hip fracture surgery. Phone #[phone].   Riley Sanchez, Riley Sanchez , MD 05/22/2015, 2:41 PM

## 2015-05-22 NOTE — Progress Notes (Signed)
Clinical Child psychotherapistocial Worker (CSW) sent Ortho progress note to [facility].   Jetta LoutBailey Morgan, LCSWA [phone]

## 2015-05-22 NOTE — Progress Notes (Signed)
Medical cleared discharge, Dr. Martha ClanKrasinski approved discharge

## 2015-05-22 NOTE — Progress Notes (Signed)
Patient is medically stable for D/C to Riley Sanchez today. [facility] is going to room E-15. Per Neena RhymesJoann Riley Sanchez can check patient's INR daily and adjust coumadin accordingly. RN will call report and arrange EMS for transport. Clinical Child psychotherapistocial Worker (CSW) sent D/C summary, D/C packet and FL2 to [facility] via [facility]. EMS form and prescription on chart. Patient is aware of above. Patient's wife Riley Sanchez is at bedside and aware of above. Please reconsult if future social work needs arise. CSW signing off.   Jetta LoutBailey Morgan, LCSWA [phone]

## 2015-05-22 NOTE — Clinical Social Work Placement (Signed)
   CLINICAL SOCIAL WORK PLACEMENT  NOTE  Date:  05/22/2015  Patient Details  Name: Riley BimlerMilton Schlotterbeck Jr. MRN: [id number] Date of Birth: [birth year]  Clinical Social Work is seeking post-discharge placement for this patient at the Skilled  Nursing Facility level of care (*CSW will initial, date and re-position this form in  chart as items are completed):  Yes   Patient/family provided with [place] Clinical Social Work Department's list of facilities offering this level of care within the geographic area requested by the patient (or if unable, by the patient's family).  Yes   Patient/family informed of their freedom to choose among providers that offer the needed level of care, that participate in Medicare, Medicaid or managed care program needed by the patient, have an available bed and are willing to accept the patient.  Yes   Patient/family informed of 's ownership interest in [facility] and [facility], as well as of the fact that they are under no obligation to receive care at these facilities.  PASRR submitted to EDS on 05/20/15     PASRR number received on 05/20/15     Existing PASRR number confirmed on       FL2 transmitted to all facilities in geographic area requested by pt/family on 05/20/15     FL2 transmitted to all facilities within larger geographic area on       Patient informed that his/her managed care company has contracts with or will negotiate with certain facilities, including the following:        Yes   Patient/family informed of bed offers received.  Patient chooses bed at  [facility] )     Physician recommends and patient chooses bed at      Patient to be transferred to  [facility] ) on 05/22/15.  Patient to be transferred to facility by  [facility] )     Patient family notified on 05/22/15 of transfer.  Name of family member notified:   (Wife is at bedside and aware of D/C today. )     PHYSICIAN       Additional  Comment:    _______________________________________________ Riley Sanchez, Riley Ransom G, LCSW 05/22/2015, 9:53 AM

## 2015-05-22 NOTE — Discharge Summary (Signed)
[facility] [place] [facility]  DISCHARGE SUMMARY   PATIENT NAME: Riley Sanchez    MR#:  [id number]  DATE OF BIRTH:  September 18, 1929  DATE OF ADMISSION:  05/18/2015 ADMITTING PHYSICIAN: Enedina Finner, MD  DATE OF DISCHARGE: 05/22/2015  PRIMARY CARE PHYSICIAN: Sula Rumple, MD    ADMISSION DIAGNOSIS:  Hip fracture, left, closed, initial encounter (HCC) [S72.002A]  DISCHARGE DIAGNOSIS:  Active Problems:   Hip fracture (HCC)   SECONDARY DIAGNOSIS:   Past Medical History  Diagnosis Date  . Diabetes mellitus without complication (HCC)   . Hypertension     HOSPITAL COURSE:    1. IMPACTED left femoral neck fracture: Status post ORIF on November 15. Has had a lot of pain issues, but this is improving.  Continue pain medication as needed. Continue incentive spirometry, physical therapy and follow up with Orthopedics. Being discharged to skilled nursing.  Will be on couamdin which will cover DVT prophylaxis.  #2,paroxysmal atrial fibrillation: On coumadin, reversed with vitamin K for surgery. Coumadin restarted.  INR is not yet theraputic 1.3 today.  Will need daily INR checks and adjustment in coumadin dosing to be made by Skilled Nursing Facility.  Discussed with cardiology, no lovenox bridge needed.  3. History of ICD, PPM placement. History of ischemic cardiomyopathy: No exacerbation during hospitalization.  Continue home regimen and follow up with cardiology as previously scheduled.  #4 Diabetes mellitus with diabetic neuropathy: CBG's well controlled. Continue metformin, Continue Neurontin for neuropathy.  #5 constipation: continue miralax, colase.  DISCHARGE CONDITIONS:   stable  CONSULTS OBTAINED:  Treatment Team:  Juanell Fairly, MD  DRUG ALLERGIES:   Allergies  Allergen Reactions  . Bactrim [Sulfamethoxazole-Trimethoprim] Other (See Comments)    Reaction:  Unknown     DISCHARGE MEDICATIONS:   Current Discharge Medication  List    START taking these medications   Details  acetaminophen (TYLENOL) 325 MG tablet Take 2 tablets (650 mg total) by mouth every 6 (six) hours as needed for mild pain (or Fever >/= 101). Qty: 30 tablet, Refills: 0    bisacodyl (DULCOLAX) 10 MG suppository Place 1 suppository (10 mg total) rectally daily as needed for moderate constipation. Qty: 12 suppository, Refills: 0    ferrous sulfate 325 (65 FE) MG tablet Take 1 tablet (325 mg total) by mouth 3 (three) times daily after meals. Qty: 90 tablet, Refills: 3    oxyCODONE (OXY IR/ROXICODONE) 5 MG immediate release tablet Take 1-2 tablets (5-10 mg total) by mouth every 4 (four) hours as needed for breakthrough pain ((for MODERATE breakthrough pain)). Qty: 30 tablet, Refills: 0    polyethylene glycol (MIRALAX / GLYCOLAX) packet Take 17 g by mouth daily as needed for mild constipation. Qty: 14 each, Refills: 0      CONTINUE these medications which have NOT CHANGED   Details  aspirin EC 81 MG tablet Take 81 mg by mouth daily.    cholecalciferol (VITAMIN D) 1000 UNITS tablet Take 1,000 Units by mouth daily.    diphenhydrAMINE (BENADRYL) 25 MG tablet Take 25 mg by mouth at bedtime as needed for sleep.    gabapentin (NEURONTIN) 100 MG capsule Take 1 capsule (100 mg total) by mouth at bedtime. Qty: 3 capsule, Refills: 3    lisinopril (PRINIVIL,ZESTRIL) 10 MG tablet Take 10 mg by mouth daily.    lovastatin (MEVACOR) 40 MG tablet Take 40 mg by mouth at bedtime.    metFORMIN (GLUCOPHAGE) 1000 MG tablet Take 1,000 mg by mouth 2 (two) times  daily with a meal.    nitroGLYCERIN (NITROSTAT) 0.4 MG SL tablet Place 0.4 mg under the tongue every 5 (five) minutes as needed for chest pain.    pantoprazole (PROTONIX) 20 MG tablet Take 20 mg by mouth daily.    tamsulosin (FLOMAX) 0.4 MG CAPS capsule Take 0.4 mg by mouth 2 (two) times daily.    warfarin (COUMADIN) 5 MG tablet Take 5 mg by mouth daily.         DISCHARGE INSTRUCTIONS:      DIET:  Cardiac diet and Diabetic diet  DISCHARGE CONDITION:  Stable  ACTIVITY:  Activity as tolerated per physical therapy recomendations  OXYGEN:  Home Oxygen: No.   Oxygen Delivery: room air  DISCHARGE LOCATION:  nursing home   If you experience worsening of your admission symptoms, develop shortness of breath, life threatening emergency, suicidal or homicidal thoughts you must seek medical attention immediately by calling 911 or calling your MD immediately  if symptoms less severe.  You Must read complete instructions/literature along with all the possible adverse reactions/side effects for all the Medicines you take and that have been prescribed to you. Take any new Medicines after you have completely understood and accpet all the possible adverse reactions/side effects.   Please note  You were cared for by a hospitalist during your hospital stay. If you have any questions about your discharge medications or the care you received while you were in the hospital after you are discharged, you can call the unit and asked to speak with the hospitalist on call if the hospitalist that took care of you is not available. Once you are discharged, your primary care physician will handle any further medical issues. Please note that NO REFILLS for any discharge medications will be authorized once you are discharged, as it is imperative that you return to your primary care physician (or establish a relationship with a primary care physician if you do not have one) for your aftercare needs so that they can reassess your need for medications and monitor your lab values.   Today   CHIEF COMPLAINT:   Chief Complaint  Patient presents with  . Fall  . Hip Pain    HISTORY OF PRESENT ILLNESS:  Riley Sanchez is a 81 y.o. male with a known history of hypertension, paroxysmal A. fib on warfarin which was recently started in October 2016 by Dr. Cassie FreerParachos, cardiomyopathy ischemic, chronic  systolic congestive heart failure, biventricular ICD for ischemic cardio myopathy with recent normal intervention consisted emergency room after he had a mechanical fall at home. Patient was doing some light balance exercises with his wife at home in the kitchen area. He apparently lost balance denies any dizziness or any syncopal episode. He fell landed on his left hip and was found to have left femoral neck fracture. Internal medicine was consulted for evaluation and management. She denies any chest pain shortness of breath. He is able to climb a flight of stairs without any chest pain or shortness of breath. He's had no anesthesia complications in the past.   VITAL SIGNS:  Blood pressure 140/59, pulse 71, temperature 98.4 F (36.9 C), temperature source Oral, resp. rate 17, height 5\' 11"  (1.803 m), weight 80.74 kg (178 lb), SpO2 95 %.  I/O:   Intake/Output Summary (Last 24 hours) at 05/22/15 0850 Last data filed at 05/22/15 0800  Gross per 24 hour  Intake  832.5 ml  Output   1400 ml  Net -567.5 ml    PHYSICAL  EXAMINATION:  GENERAL:  81 y.o.-year-old patient lying in the bed with no acute distress.   LUNGS: Normal breath sounds bilaterally, no wheezing, rales,rhonchi or crepitation. No use of accessory muscles of respiration.  CARDIOVASCULAR: S1, S2 normal. No murmurs, rubs, or gallops.  ABDOMEN: Soft, non-tender, non-distended. Bowel sounds present. No organomegaly or mass.  EXTREMITIES: No pedal edema, cyanosis, or clubbing.  NEUROLOGIC: Cranial nerves II through XII are intact. Muscle strength 5/5 in all extremities (left leg not tested). Sensation intact. Gait not checked.  PSYCHIATRIC: The patient is alert and oriented x 3.  SKIN: No obvious rash, lesion, or ulcer. Bruising over right arm, no hematoma  DATA REVIEW:   CBC  Recent Labs Lab 05/22/15 0438  WBC 7.7  HGB 10.5*  HCT 31.2*  PLT 174    Chemistries   Recent Labs Lab 05/21/15 0454  NA 136  K 4.1  CL 106   CO2 21*  GLUCOSE 136*  BUN 12  CREATININE 0.76  CALCIUM 7.9*    Cardiac Enzymes No results for input(s): TROPONINI in the last 168 hours.  Microbiology Results  Results for orders placed or performed during the hospital encounter of 05/18/15  Urine culture     Status: None   Collection Time: 05/18/15  5:50 PM  Result Value Ref Range Status   Specimen Description URINE, CLEAN CATCH  Final   Special Requests NONE  Final   Culture NO GROWTH 2 DAYS  Final   Report Status 05/20/2015 FINAL  Final  MRSA PCR Screening     Status: None   Collection Time: 05/18/15  5:51 PM  Result Value Ref Range Status   MRSA by PCR NEGATIVE NEGATIVE Final    Comment:        The GeneXpert MRSA Assay (FDA approved for NASAL specimens only), is one component of a comprehensive MRSA colonization surveillance program. It is not intended to diagnose MRSA infection nor to guide or monitor treatment for MRSA infections.     RADIOLOGY:  No results found.  EKG:   Orders placed or performed in visit on 06/22/11  . EKG 12-Lead      Management plans discussed with the patient, family and they are in agreement.  CODE STATUS:     Code Status Orders        Start     Ordered   05/19/15 1408  Full code   Continuous     05/19/15 1407      TOTAL TIME TAKING CARE OF THIS PATIENT: 35 minutes.  Greater than 50% of time spent in care coordination and counseling.  Elby Showers M.D on 05/22/2015 at 8:50 AM  Between 7am to 6pm - Pager - [phone]  After 6pm go to [email or web address] - password [email or web address] [facility]  [facility]  Office  [phone]  CC: Primary care physician; Sula Rumple, MD

## 2015-05-22 NOTE — Progress Notes (Signed)
Physical Therapy Treatment Patient Details Name: Riley Sanchez. MRN: [id number] DOB: 30-Nov-1929 Today's Date: 05/22/2015    History of Present Illness Pt is an 81 y.o. male s/p mechanical fall suffering L femoral neck hip fx.  Pt s/p 05/19/15 percutaneous cannulated screw fixation of L femoral neck hip fx.  PMH includes LE neuropathy, paroxysmal a-fib on warfarin, chronic systolic CHF, biventricular ICD.    PT Comments    Pt demonstrating improved L quad strength today (with L quad set ex) and appeared to tolerate functional mobility with less pain today.  Pt still requiring significant assist for functional mobility and requires assist to maintain NWB'ing status.  Plan for pt to discharge to STR today.   Follow Up Recommendations  SNF     Equipment Recommendations       Recommendations for Other Services       Precautions / Restrictions Precautions Precautions: Fall Restrictions Weight Bearing Restrictions: Yes LLE Weight Bearing: Non weight bearing    Mobility  Bed Mobility Overal bed mobility: Needs Assistance Bed Mobility: Supine to Sit;Sit to Supine     Supine to sit: Max assist;HOB elevated (increased time to perform with max vc's for technique) Sit to supine: Mod assist;+2 for physical assistance   General bed mobility comments: assist for trunk and B LE's, vc's for technique required  Transfers Overall transfer level: Needs assistance Equipment used: Rolling walker (2 wheeled) Transfers: Sit to/from Stand Sit to Stand: Mod assist;+2 physical assistance;From elevated surface         General transfer comment: pt requiring assist to maintain NWB LE LE; vc's required and increased time required for upright posture  Ambulation/Gait             General Gait Details: not appropriate at this time d/t significant assist levels and difficulty maintaining WB'ing precautions   Stairs            Wheelchair Mobility    Modified Rankin (Stroke  Patients Only)       Balance Overall balance assessment: Needs assistance Sitting-balance support: Bilateral upper extremity supported;Feet supported Sitting balance-Leahy Scale: Fair Sitting balance - Comments: mild R lean d/t L hip pain   Standing balance support: Bilateral upper extremity supported (on RW) Standing balance-Leahy Scale: Poor                      Cognition Arousal/Alertness: Awake/alert Behavior During Therapy: WFL for tasks assessed/performed Overall Cognitive Status: Within Functional Limits for tasks assessed                      Exercises   Performed semi-supine B LE therapeutic exercise x 10 reps:  Ankle pumps (AROM B LE's); quad sets x3 second holds (AROM B LE's); glute squeezes x3 second holds (AROM B); heelslides (AROM R; AAROM L), hip abd/adduction (AROM R; AAROM L).  Pt required vc's and tactile cues for correct technique with exercises.     General Comments General comments (skin integrity, edema, etc.): L hip dressings in place  Nursing cleared pt for participation in physical therapy.  Pt agreeable to PT session.      Pertinent Vitals/Pain Pain Assessment: 0-10 Pain Score:  (0/10 at rest; 8/10 with activity) Pain Location: L hip Pain Descriptors / Indicators: Sore;Tender;Aching;Sharp;Shooting Pain Intervention(s): Limited activity within patient's tolerance;Monitored during session;Premedicated before session;Repositioned    Home Living  Prior Function            PT Goals (current goals can now be found in the care plan section) Acute Rehab PT Goals Patient Stated Goal: to get better PT Goal Formulation: With patient Time For Goal Achievement: 06/03/15 Potential to Achieve Goals: Good Progress towards PT goals: Progressing toward goals    Frequency  BID    PT Plan Current plan remains appropriate    Co-evaluation             End of Session Equipment Utilized During Treatment:  Gait belt Activity Tolerance: Patient limited by pain;Patient limited by fatigue Patient left: in bed;with call bell/phone within reach;with bed alarm set;with SCD's reapplied (B heels elevated)     Time: [id number] PT Time Calculation (min) (ACUTE ONLY): 38 min  Charges:  $Therapeutic Exercise: 8-22 mins $Therapeutic Activity: 23-37 mins                    G CodesHendricks Sanchez:      Riley Sanchez 05/22/2015, 10:44 AM Riley Sanchez, PT [phone]

## 2015-06-13 ENCOUNTER — Inpatient Hospital Stay
Admission: EM | Admit: 2015-06-13 | Discharge: 2015-06-19 | DRG: 871 | Disposition: A | Discharge status: Skilled Nursing Facility | Payer: Medicare Other | Attending: Internal Medicine | Admitting: Internal Medicine

## 2015-06-13 ENCOUNTER — Inpatient Hospital Stay: Payer: Medicare Other

## 2015-06-13 ENCOUNTER — Encounter: Payer: Self-pay | Admitting: Emergency Medicine

## 2015-06-13 ENCOUNTER — Emergency Department: Payer: Medicare Other

## 2015-06-13 DIAGNOSIS — N400 Enlarged prostate without lower urinary tract symptoms: Secondary | ICD-10-CM | POA: Diagnosis present

## 2015-06-13 DIAGNOSIS — J690 Pneumonitis due to inhalation of food and vomit: Secondary | ICD-10-CM | POA: Diagnosis present

## 2015-06-13 DIAGNOSIS — E11621 Type 2 diabetes mellitus with foot ulcer: Secondary | ICD-10-CM | POA: Diagnosis present

## 2015-06-13 DIAGNOSIS — I471 Supraventricular tachycardia: Secondary | ICD-10-CM | POA: Diagnosis not present

## 2015-06-13 DIAGNOSIS — Z7901 Long term (current) use of anticoagulants: Secondary | ICD-10-CM | POA: Diagnosis not present

## 2015-06-13 DIAGNOSIS — J189 Pneumonia, unspecified organism: Secondary | ICD-10-CM

## 2015-06-13 DIAGNOSIS — J9601 Acute respiratory failure with hypoxia: Secondary | ICD-10-CM | POA: Diagnosis present

## 2015-06-13 DIAGNOSIS — Z951 Presence of aortocoronary bypass graft: Secondary | ICD-10-CM

## 2015-06-13 DIAGNOSIS — I255 Ischemic cardiomyopathy: Secondary | ICD-10-CM | POA: Diagnosis present

## 2015-06-13 DIAGNOSIS — Z95810 Presence of automatic (implantable) cardiac defibrillator: Secondary | ICD-10-CM

## 2015-06-13 DIAGNOSIS — K920 Hematemesis: Secondary | ICD-10-CM | POA: Diagnosis present

## 2015-06-13 DIAGNOSIS — R791 Abnormal coagulation profile: Secondary | ICD-10-CM | POA: Diagnosis present

## 2015-06-13 DIAGNOSIS — G9340 Encephalopathy, unspecified: Secondary | ICD-10-CM | POA: Diagnosis not present

## 2015-06-13 DIAGNOSIS — Z7984 Long term (current) use of oral hypoglycemic drugs: Secondary | ICD-10-CM | POA: Diagnosis not present

## 2015-06-13 DIAGNOSIS — R112 Nausea with vomiting, unspecified: Secondary | ICD-10-CM

## 2015-06-13 DIAGNOSIS — Z950 Presence of cardiac pacemaker: Secondary | ICD-10-CM

## 2015-06-13 DIAGNOSIS — K219 Gastro-esophageal reflux disease without esophagitis: Secondary | ICD-10-CM | POA: Diagnosis present

## 2015-06-13 DIAGNOSIS — L89612 Pressure ulcer of right heel, stage 2: Secondary | ICD-10-CM | POA: Diagnosis present

## 2015-06-13 DIAGNOSIS — Z87891 Personal history of nicotine dependence: Secondary | ICD-10-CM | POA: Diagnosis not present

## 2015-06-13 DIAGNOSIS — Z7982 Long term (current) use of aspirin: Secondary | ICD-10-CM

## 2015-06-13 DIAGNOSIS — R042 Hemoptysis: Secondary | ICD-10-CM | POA: Diagnosis present

## 2015-06-13 DIAGNOSIS — I110 Hypertensive heart disease with heart failure: Secondary | ICD-10-CM | POA: Diagnosis present

## 2015-06-13 DIAGNOSIS — I480 Paroxysmal atrial fibrillation: Secondary | ICD-10-CM | POA: Diagnosis present

## 2015-06-13 DIAGNOSIS — E872 Acidosis: Secondary | ICD-10-CM | POA: Diagnosis present

## 2015-06-13 DIAGNOSIS — R310 Gross hematuria: Secondary | ICD-10-CM | POA: Diagnosis present

## 2015-06-13 DIAGNOSIS — Z79891 Long term (current) use of opiate analgesic: Secondary | ICD-10-CM

## 2015-06-13 DIAGNOSIS — E876 Hypokalemia: Secondary | ICD-10-CM | POA: Diagnosis not present

## 2015-06-13 DIAGNOSIS — Z79899 Other long term (current) drug therapy: Secondary | ICD-10-CM

## 2015-06-13 DIAGNOSIS — L8990 Pressure ulcer of unspecified site, unspecified stage: Secondary | ICD-10-CM | POA: Insufficient documentation

## 2015-06-13 DIAGNOSIS — I2510 Atherosclerotic heart disease of native coronary artery without angina pectoris: Secondary | ICD-10-CM | POA: Diagnosis present

## 2015-06-13 DIAGNOSIS — I5043 Acute on chronic combined systolic (congestive) and diastolic (congestive) heart failure: Secondary | ICD-10-CM | POA: Diagnosis present

## 2015-06-13 DIAGNOSIS — Z23 Encounter for immunization: Secondary | ICD-10-CM

## 2015-06-13 DIAGNOSIS — A419 Sepsis, unspecified organism: Secondary | ICD-10-CM | POA: Diagnosis present

## 2015-06-13 DIAGNOSIS — R0602 Shortness of breath: Secondary | ICD-10-CM

## 2015-06-13 HISTORY — DX: Unspecified atrial fibrillation: I48.91

## 2015-06-13 HISTORY — DX: Gastro-esophageal reflux disease without esophagitis: K21.9

## 2015-06-13 HISTORY — DX: Heart failure, unspecified: I50.9

## 2015-06-13 HISTORY — DX: Unspecified fracture of unspecified femur, initial encounter for closed fracture: S72.90XA

## 2015-06-13 LAB — CBC WITH DIFFERENTIAL/PLATELET
BASOS ABS: 0 10*3/uL (ref 0–0.1)
BASOS PCT: 0 %
Eosinophils Absolute: 0 10*3/uL (ref 0–0.7)
Eosinophils Relative: 0 %
HEMATOCRIT: 37.1 % — AB (ref 40.0–52.0)
HEMOGLOBIN: 12.3 g/dL — AB (ref 13.0–18.0)
LYMPHS PCT: 9 %
Lymphs Abs: 1.2 10*3/uL (ref 1.0–3.6)
MCH: 29.9 pg (ref 26.0–34.0)
MCHC: 33 g/dL (ref 32.0–36.0)
MCV: 90.4 fL (ref 80.0–100.0)
MONO ABS: 0.5 10*3/uL (ref 0.2–1.0)
Monocytes Relative: 3 %
NEUTROS ABS: 12.6 10*3/uL — AB (ref 1.4–6.5)
NEUTROS PCT: 88 %
Platelets: 270 10*3/uL (ref 150–440)
RBC: 4.11 MIL/uL — AB (ref 4.40–5.90)
RDW: 14.2 % (ref 11.5–14.5)
WBC: 14.4 10*3/uL — AB (ref 3.8–10.6)

## 2015-06-13 LAB — URINALYSIS COMPLETE WITH MICROSCOPIC (ARMC ONLY)
Bilirubin Urine: NEGATIVE
Glucose, UA: 50 mg/dL — AB
Leukocytes, UA: NEGATIVE
Nitrite: NEGATIVE
PH: 5 (ref 5.0–8.0)
Protein, ur: NEGATIVE mg/dL
Specific Gravity, Urine: 1.016 (ref 1.005–1.030)

## 2015-06-13 LAB — COMPREHENSIVE METABOLIC PANEL
ALT: 9 U/L — ABNORMAL LOW (ref 17–63)
ANION GAP: 8 (ref 5–15)
AST: 13 U/L — AB (ref 15–41)
Albumin: 3.3 g/dL — ABNORMAL LOW (ref 3.5–5.0)
Alkaline Phosphatase: 67 U/L (ref 38–126)
BILIRUBIN TOTAL: 1.3 mg/dL — AB (ref 0.3–1.2)
BUN: 19 mg/dL (ref 6–20)
CHLORIDE: 105 mmol/L (ref 101–111)
CO2: 23 mmol/L (ref 22–32)
Calcium: 8.6 mg/dL — ABNORMAL LOW (ref 8.9–10.3)
Creatinine, Ser: 1.09 mg/dL (ref 0.61–1.24)
Glucose, Bld: 216 mg/dL — ABNORMAL HIGH (ref 65–99)
POTASSIUM: 5.1 mmol/L (ref 3.5–5.1)
Sodium: 136 mmol/L (ref 135–145)
TOTAL PROTEIN: 6.6 g/dL (ref 6.5–8.1)

## 2015-06-13 LAB — PROTIME-INR
INR: 3.53
Prothrombin Time: 34.6 seconds — ABNORMAL HIGH (ref 11.4–15.0)

## 2015-06-13 LAB — LACTIC ACID, PLASMA: LACTIC ACID, VENOUS: 2.5 mmol/L — AB (ref 0.5–2.0)

## 2015-06-13 LAB — TROPONIN I

## 2015-06-13 MED ORDER — ONDANSETRON HCL 4 MG/2ML IJ SOLN
INTRAMUSCULAR | Status: AC
  Administered 2015-06-13: 4 mg via INTRAVENOUS
  Filled 2015-06-13: qty 2

## 2015-06-13 MED ORDER — VANCOMYCIN HCL IN DEXTROSE 1-5 GM/200ML-% IV SOLN
1000.0000 mg | Freq: Once | INTRAVENOUS | Status: AC
  Administered 2015-06-13: 1000 mg via INTRAVENOUS
  Filled 2015-06-13: qty 200

## 2015-06-13 MED ORDER — ONDANSETRON HCL 4 MG PO TABS: 4.0000 mg | ORAL_TABLET | Freq: Four times a day (QID) | ORAL | Status: DC | PRN

## 2015-06-13 MED ORDER — ACETAMINOPHEN 650 MG RE SUPP
650.0000 mg | Freq: Four times a day (QID) | RECTAL | Status: DC | PRN
  Administered 2015-06-13: 650 mg via RECTAL
  Filled 2015-06-13: qty 1

## 2015-06-13 MED ORDER — SODIUM CHLORIDE 0.9 % IV BOLUS (SEPSIS)
1000.0000 mL | INTRAVENOUS | Status: AC
  Administered 2015-06-13 (×2): 1000 mL via INTRAVENOUS

## 2015-06-13 MED ORDER — LEVOFLOXACIN IN D5W 750 MG/150ML IV SOLN
750.0000 mg | Freq: Once | INTRAVENOUS | Status: AC
  Administered 2015-06-13: 750 mg via INTRAVENOUS
  Filled 2015-06-13: qty 150

## 2015-06-13 MED ORDER — ACETAMINOPHEN 325 MG PO TABS
650.0000 mg | ORAL_TABLET | Freq: Four times a day (QID) | ORAL | Status: DC | PRN
  Administered 2015-06-14 – 2015-06-19 (×3): 650 mg via ORAL
  Filled 2015-06-13 (×3): qty 2

## 2015-06-13 MED ORDER — ONDANSETRON HCL 4 MG/2ML IJ SOLN
4.0000 mg | Freq: Four times a day (QID) | INTRAMUSCULAR | Status: DC | PRN
  Administered 2015-06-13 – 2015-06-15 (×2): 4 mg via INTRAVENOUS
  Filled 2015-06-13: qty 2

## 2015-06-13 MED ORDER — SODIUM CHLORIDE 0.9 % IV BOLUS (SEPSIS)
500.0000 mL | INTRAVENOUS | Status: AC
Start: 2015-06-13 — End: 2015-06-13
  Administered 2015-06-13: 500 mL via INTRAVENOUS

## 2015-06-13 MED ORDER — PIPERACILLIN-TAZOBACTAM 3.375 G IVPB 30 MIN
3.3750 g | Freq: Once | INTRAVENOUS | Status: AC
  Administered 2015-06-13: 3.375 g via INTRAVENOUS
  Filled 2015-06-13: qty 50

## 2015-06-13 NOTE — H&P (Addendum)
[facility] -  at [facility]   PATIENT NAME: Riley Sanchez    MR#:  [id number]  DATE OF BIRTH:  08/02/1929  DATE OF ADMISSION:  06/13/2015  PRIMARY CARE PHYSICIAN: Sula Rumple, MD   REQUESTING/REFERRING PHYSICIAN:   CHIEF COMPLAINT:   Chief Complaint  Patient presents with  . Fever    altered mental status, tachycardic    HISTORY OF PRESENT ILLNESS: Riley Sanchez  is a 81 y.o. male with a known history of admission in November 2016 for impacted left femoral neck fracture status post ORIF on 05/19/2015 by Dr. Martha Clan presents to the hospital from skilled nursing facility rehabilitation with nausea, vomiting and coughing. Patient was doing well after surgery. He was seen by orthopedist surgeon yesterday and had his staples taken out. He did not feel well today in the morning and later in the afternoon, became nauseated and vomited. After episode of nausea and vomiting. He had choking sensation and has been coughing and was sent to emergency room for further evaluation where he was noted to be hypoxic, tachypneic, febrile to 103. His O2 sats were as low as 85%. Hospitalist services were contacted for admission for aspiration versus healthcare associated pneumonia. Patient continues to have intermittent nausea and vomiting. He choked with vomitus earlier in the emergency room. He apparently was given some Phenergan in the facility and was brought to emergency room where he was found to be intermittently obtunded, not able to review systems.   PAST MEDICAL HISTORY:   Past Medical History  Diagnosis Date  . Diabetes mellitus without complication (HCC)   . Hypertension   . A-fib (HCC)   . GERD (gastroesophageal reflux disease)   . CHF (congestive heart failure) (HCC)   . Fracture, femur (HCC)     PAST SURGICAL HISTORY:  Past Surgical History  Procedure Laterality Date  . Cardiac surgery      bypass in 89  . Cardiac defibrillator placement       replaced 1/16, medtronic  . Cardiac defibrillator placement      medtronic 1/16  . Hip pinning,cannulated Left 05/19/2015    Procedure: CANNULATED HIP PINNING;  Surgeon: Juanell Fairly, MD;  Location: [facility] ORS;  Service: Orthopedics;  Laterality: Left;    SOCIAL HISTORY:  Social History  Substance Use Topics  . Smoking status: Former Games developer  . Smokeless tobacco: Not on file  . Alcohol Use: No    FAMILY HISTORY: History reviewed. No pertinent family history.  DRUG ALLERGIES:  Allergies  Allergen Reactions  . Bactrim [Sulfamethoxazole-Trimethoprim] Other (See Comments)    Reaction:  Unknown   . Trimethoprim     Review of Systems  Unable to perform ROS: acuity of condition    MEDICATIONS AT HOME:  Prior to Admission medications   Medication Sig Start Date End Date Taking? Authorizing Provider  acetaminophen (TYLENOL) 325 MG tablet Take 2 tablets (650 mg total) by mouth every 6 (six) hours as needed for mild pain (or Fever >/= 101). 05/22/15   Gale Journey, MD  aspirin EC 81 MG tablet Take 81 mg by mouth daily.    Historical Provider, MD  bisacodyl (DULCOLAX) 10 MG suppository Place 1 suppository (10 mg total) rectally daily as needed for moderate constipation. 05/22/15   Gale Journey, MD  cholecalciferol (VITAMIN D) 1000 UNITS tablet Take 1,000 Units by mouth daily.    Historical Provider, MD  diphenhydrAMINE (BENADRYL) 25 MG tablet Take 25 mg by mouth at bedtime as needed  for sleep.    Historical Provider, MD  ferrous sulfate 325 (65 FE) MG tablet Take 1 tablet (325 mg total) by mouth 3 (three) times daily after meals. 05/22/15   Gale Journeyatherine P Walsh, MD  gabapentin (NEURONTIN) 100 MG capsule Take 1 capsule (100 mg total) by mouth at bedtime. 04/06/15   Max T Hyatt, DPM  lisinopril (PRINIVIL,ZESTRIL) 10 MG tablet Take 10 mg by mouth daily.    Historical Provider, MD  lovastatin (MEVACOR) 40 MG tablet Take 40 mg by mouth at bedtime.    Historical Provider, MD  metFORMIN  (GLUCOPHAGE) 1000 MG tablet Take 1,000 mg by mouth 2 (two) times daily with a meal.    Historical Provider, MD  nitroGLYCERIN (NITROSTAT) 0.4 MG SL tablet Place 0.4 mg under the tongue every 5 (five) minutes as needed for chest pain.    Historical Provider, MD  oxyCODONE (OXY IR/ROXICODONE) 5 MG immediate release tablet Take 1-2 tablets (5-10 mg total) by mouth every 4 (four) hours as needed for breakthrough pain ((for MODERATE breakthrough pain)). 05/22/15   Gale Journeyatherine P Walsh, MD  pantoprazole (PROTONIX) 20 MG tablet Take 20 mg by mouth daily.    Historical Provider, MD  polyethylene glycol (MIRALAX / GLYCOLAX) packet Take 17 g by mouth daily as needed for mild constipation. 05/22/15   Gale Journeyatherine P Walsh, MD  tamsulosin (FLOMAX) 0.4 MG CAPS capsule Take 0.4 mg by mouth 2 (two) times daily.    Historical Provider, MD  warfarin (COUMADIN) 5 MG tablet Take 5 mg by mouth daily.    Historical Provider, MD      PHYSICAL EXAMINATION:   VITAL SIGNS: Blood pressure 154/52, pulse 78, temperature 103.1 F (39.5 C), temperature source Axillary, resp. rate 40, height 5\' 7"  (1.702 m), weight 78.642 kg (173 lb 6 oz), SpO2 85 %.  GENERAL:  81 y.o.-year-old patient lying in the bed in moderate respiratory distress, nauseated,  pale, tachypneic.  EYES: Pupils equal, round, reactive to light and accommodation. No scleral icterus. Extraocular muscles intact.  HEENT: Head atraumatic, normocephalic. Oropharynx and nasopharynx clear.  NECK:  Supple, no jugular venous distention. No thyroid enlargement, no tenderness.  LUNGS: Some diminished breath sounds bilaterally, no wheezing, bilateral rales,rhonchi and crepitations noted, especially in right lower lobe. Using accessory muscles of respiration to breathe.  CARDIOVASCULAR: S1, S2 , irregular. No murmurs, rubs, or gallops, although partially obscured due to adventitial sounds in the lungs.  ABDOMEN: Soft, nontender, nondistended. Bowel sounds present. No organomegaly  or mass.  EXTREMITIES: No pedal edema, cyanosis, or clubbing.  NEUROLOGIC: Cranial nerves II through XII are intact. Muscle strength , unable to evaluate in all extremities. Due to patient being somewhat obtunded. Sensation unable to evaluate. Gait not checked.  PSYCHIATRIC: The patient is somnolent , practically mute and not able to assess his orientation.  SKIN: No obvious rash, lesion, or ulcer.   LABORATORY PANEL:   CBC  Recent Labs Lab 06/13/15 1948  WBC 14.4*  HGB 12.3*  HCT 37.1*  PLT 270  MCV 90.4  MCH 29.9  MCHC 33.0  RDW 14.2  LYMPHSABS 1.2  MONOABS 0.5  EOSABS 0.0  BASOSABS 0.0   ------------------------------------------------------------------------------------------------------------------  Chemistries   Recent Labs Lab 06/13/15 1948  NA 136  K 5.1  CL 105  CO2 23  GLUCOSE 216*  BUN 19  CREATININE 1.09  CALCIUM 8.6*  AST 13*  ALT 9*  ALKPHOS 67  BILITOT 1.3*   ------------------------------------------------------------------------------------------------------------------  Cardiac Enzymes  Recent Labs Lab 06/13/15 1948  TROPONINI <0.03   ------------------------------------------------------------------------------------------------------------------  RADIOLOGY: Dg Chest Port 1 View  06/13/2015  CLINICAL DATA:  Altered mental status with fever and tachycardia. EXAM: PORTABLE CHEST 1 VIEW COMPARISON:  05/18/2015 FINDINGS: Multi lead left-sided pacemaker remains in place. Patient is post median sternotomy. Development of opacity at the left lung base obscuring the left hemidiaphragm. Development of opacity in the medial right lung base. Cardiomegaly and mediastinal contours are unchanged. Question vascular congestion, no overt edema. No pneumothorax. Osseous structures are unchanged. IMPRESSION: Bibasilar opacities, left greater than right. This may reflect atelectasis, multifocal pneumonia in the setting of fever, or aspiration. Possible left  pleural effusion. Electronically Signed   By: Rubye Oaks M.D.   On: 06/13/2015 19:25    EKG: Orders placed or performed during the hospital encounter of 06/13/15  . EKG 12-Lead  . EKG 12-Lead   EKG in the emergency room reveals sinus rhythm at 94 bpm, multiple premature complexes, ventricular as well as supraventricular right bundle branch block, Q in anterior leads, nonspecific ECG changes  IMPRESSION AND PLAN:  Active Problems:   Sepsis (HCC)  1. Sepsis due to bilateral basilar pneumonia, likely healthcare associated versus aspiration pneumonitis, cultures are taken, dated broad-spectrum antibiotics. Following culture results 2. Bilateral basilar pneumonia, get sputum cultures if possible. Continue broad-spectrum antibiotics as above, following White blood cell count in the morning 3. Nausea and vomiting, unclear etiology, getting to where abdominal x-ray, place NG tube if the nausea, vomiting, chest recurrent, supportive therapy for now, nothing by mouth, IV fluids as well as Zofran and Phenergan as needed 4.  Acute respiratory failure with hypoxia due to pneumonia, oxygen therapy as needed including high flow nasal cannula, pulse oximetry at around 94 and above 5. Leukocytosis, follow with therapy 6. Lactic acidosis. Follow with therapy 7. Altered mental status, likely metabolic encephalopathy due to fever, infection, also medications. Follow neurologically every 2 hours, a CT scan of the head    the records are reviewed and case discussed with ED provider. Management plans discussed with the patient, family and they are in agreement.  CODE STATUS:    TOTAL  critical care TIME TAKING CARE OF THIS PATIENT: 55  minutes.    Katharina Caper M.D on 06/13/2015 at 9:03 PM  Between 7am to 6pm - Pager - [phone] After 6pm go to [email or web address] - password [email or web address] [facility]  [facility]  Office  [phone]  CC: Primary care physician; Sula Rumple, MD

## 2015-06-13 NOTE — ED Notes (Addendum)
Recent hip surgery - in for rehab (05/22/15) - cough, febrile, tachycardic, decreased loc. Moist weak cough.

## 2015-06-13 NOTE — ED Notes (Signed)
Pt back from xr and ct and placed back on monitor

## 2015-06-13 NOTE — ED Notes (Signed)
Pt had emesis while laying down - pt placed in a more upright position and mouth suctioned. Pt has a moist wet cough. Pt cleaned up from the emesis and changed into clean gown. Admitting dr in room and made aware.

## 2015-06-13 NOTE — ED Notes (Signed)
Per wife pt was fine yesterday. Went to see his ortho dr regarding his hip surgery. Is non weight bearing d/t left hip surgery and in [facility] re[facility] for this. Pt was nauseous prior to ems being called and phenergan 25mg  was given im by [facility]. Dr made aware. Pt will respond to some of his wife's questions, but for the most part lays in the bed with eyes closed with occasional weak moist cough.

## 2015-06-13 NOTE — Progress Notes (Signed)
ANTIBIOTIC CONSULT NOTE - INITIAL  Pharmacy Consult for vancomycin/zosyn Indication: rule out sepsis  No Known Allergies  Patient Measurements: Height: 5\' 7"  (170.2 cm) Weight: 173 lb 6 oz (78.642 kg) IBW/kg (Calculated) : 66.1 Adjusted Body Weight: 68.1 kg  Vital Signs: Temp: 103.1 F (39.5 C) (12/10 2100) Temp Source: Axillary (12/10 1903) BP: 154/52 mmHg (12/10 2100) Pulse Rate: 78 (12/10 2100) Intake/Output from previous day:   Intake/Output from this shift:    Labs:  Recent Labs  06/13/15 1948  WBC 14.4*  HGB 12.3*  PLT 270  CREATININE 1.09   Estimated Creatinine Clearance: 48 mL/min (by C-G formula based on Cr of 1.09). No results for input(s): VANCOTROUGH, VANCOPEAK, VANCORANDOM, GENTTROUGH, GENTPEAK, GENTRANDOM, TOBRATROUGH, TOBRAPEAK, TOBRARND, AMIKACINPEAK, AMIKACINTROU, AMIKACIN in the last 72 hours.   Microbiology: Recent Results (from the past 720 hour(s))  Urine culture     Status: None   Collection Time: 05/18/15  5:50 PM  Result Value Ref Range Status   Specimen Description URINE, CLEAN CATCH  Final   Special Requests NONE  Final   Culture NO GROWTH 2 DAYS  Final   Report Status 05/20/2015 FINAL  Final  MRSA PCR Screening     Status: None   Collection Time: 05/18/15  5:51 PM  Result Value Ref Range Status   MRSA by PCR NEGATIVE NEGATIVE Final    Comment:        The GeneXpert MRSA Assay (FDA approved for NASAL specimens only), is one component of a comprehensive MRSA colonization surveillance program. It is not intended to diagnose MRSA infection nor to guide or monitor treatment for MRSA infections.     Medical History: Past Medical History  Diagnosis Date  . Diabetes mellitus without complication (HCC)   . Hypertension   . A-fib (HCC)   . GERD (gastroesophageal reflux disease)   . CHF (congestive heart failure) (HCC)   . Fracture, femur (HCC)     Medications:  Infusions:   Assessment: 85 yom cc fever/AMS/tachycardia. Recent  admission in November 2016 for impacted left femoral neck fracture sp ORIF 05/19/15. Had choking sensation after vomiting, ED noted fever 103, tachypnic, hypoxia around 85%. Starting broad spectrum for r/o aspiration and sepsis.   Vd 47.6, T1/2 17.6 hr, Ke 0.0393 hr-1   Goal of Therapy:  Vancomycin trough level 15-20 mcg/ml  Plan:  Expected duration 7 days with resolution of temperature and/or normalization of WBC. Zosyn 3.375 gm IV Q8H EI and vancomycin 1 gm IV x 1 in ED followed by 750 mg IV Q12H, predicted trough 16 mcg/mL, pharmacy will continue to follow and adjust as needed to maintain trough 15 to 20 mcg/mL.  Carola FrostNathan A Kora Groom, Pharm.D., BCPS Clinical Pharmacist 06/13/2015,10:06 PM

## 2015-06-13 NOTE — ED Provider Notes (Signed)
[facility] Emergency Department Provider Note   ____________________________________________  Time seen: On EMS arrival  I have reviewed the triage vital signs and the nursing notes.   HISTORY  Chief Complaint Fever   History limited by: AMS, history obtained from EMS   HPI Riley BimlerMilton Montalban Jr. is a 81 y.o. male who presents to the emergency department today via EMS because of concerns for altered mental status. Per EMS report the patient was recently seen in the hospital for a hip surgery. Currently in rehabilitation. The patient's wife thought that he was altered today. EMS arrived and found him to be febrile. He also vomited a couple times for EMS. They did give him 500 mL of normal saline on the way to the emergency department.   Past Medical History  Diagnosis Date  . Diabetes mellitus without complication (HCC)   . Hypertension   . A-fib (HCC)   . GERD (gastroesophageal reflux disease)   . CHF (congestive heart failure) (HCC)   . Fracture, femur [facility](HCC)     Patient Active Problem List   Diagnosis Date Noted  . Hip fracture (HCC) 05/18/2015    Past Surgical History  Procedure Laterality Date  . Cardiac surgery      bypass in 89  . Cardiac defibrillator placement      replaced 1/16, medtronic  . Cardiac defibrillator placement      medtronic 1/16  . Hip pinning,cannulated Left 05/19/2015    Procedure: CANNULATED HIP PINNING;  Surgeon: Juanell FairlyKevin Krasinski, MD;  Location: [facility] ORS;  Service: Orthopedics;  Laterality: Left;    Current Outpatient Rx  Name  Route  Sig  Dispense  Refill  . acetaminophen (TYLENOL) 325 MG tablet   Oral   Take 2 tablets (650 mg total) by mouth every 6 (six) hours as needed for mild pain (or Fever >/= 101).   30 tablet   0   . aspirin EC 81 MG tablet   Oral   Take 81 mg by mouth daily.         . bisacodyl (DULCOLAX) 10 MG suppository   Rectal   Place 1 suppository (10 mg total) rectally daily as needed for  moderate constipation.   12 suppository   0   . cholecalciferol (VITAMIN D) 1000 UNITS tablet   Oral   Take 1,000 Units by mouth daily.         . diphenhydrAMINE (BENADRYL) 25 MG tablet   Oral   Take 25 mg by mouth at bedtime as needed for sleep.         . ferrous sulfate 325 (65 FE) MG tablet   Oral   Take 1 tablet (325 mg total) by mouth 3 (three) times daily after meals.   90 tablet   3   . gabapentin (NEURONTIN) 100 MG capsule   Oral   Take 1 capsule (100 mg total) by mouth at bedtime.   3 capsule   3   . lisinopril (PRINIVIL,ZESTRIL) 10 MG tablet   Oral   Take 10 mg by mouth daily.         Marland Kitchen. lovastatin (MEVACOR) 40 MG tablet   Oral   Take 40 mg by mouth at bedtime.         . metFORMIN (GLUCOPHAGE) 1000 MG tablet   Oral   Take 1,000 mg by mouth 2 (two) times daily with a meal.         . nitroGLYCERIN (NITROSTAT) 0.4 MG SL tablet  Sublingual   Place 0.4 mg under the tongue every 5 (five) minutes as needed for chest pain.         Marland Kitchen oxyCODONE (OXY IR/ROXICODONE) 5 MG immediate release tablet   Oral   Take 1-2 tablets (5-10 mg total) by mouth every 4 (four) hours as needed for breakthrough pain ((for MODERATE breakthrough pain)).   30 tablet   0   . pantoprazole (PROTONIX) 20 MG tablet   Oral   Take 20 mg by mouth daily.         . polyethylene glycol (MIRALAX / GLYCOLAX) packet   Oral   Take 17 g by mouth daily as needed for mild constipation.   14 each   0   . tamsulosin (FLOMAX) 0.4 MG CAPS capsule   Oral   Take 0.4 mg by mouth 2 (two) times daily.         Marland Kitchen warfarin (COUMADIN) 5 MG tablet   Oral   Take 5 mg by mouth daily.           Allergies Bactrim  No family history on file.  Social History Social History  Substance Use Topics  . Smoking status: Former Games developer  . Smokeless tobacco: Not on file  . Alcohol Use: No    Review of Systems Unable to obtain secondary to  AMS ____________________________________________   PHYSICAL EXAM:  VITAL SIGNS: ED Triage Vitals  Enc Vitals Group     BP 06/13/15 1856 166/51 mmHg     Pulse Rate 06/13/15 1856 90     Resp 06/13/15 1856 30     Temp 06/13/15 1903 101.2 F (38.4 C)     Temp Source 06/13/15 1903 Axillary     SpO2 06/13/15 1856 86 %     Weight 06/13/15 1856 173 lb 6 oz (78.642 kg)     Height 06/13/15 1856  (1.702 m)   Constitutional: Somnolent, will wake up to verbal stimuli, mumbles Eyes: Conjunctivae are normal. PERRL. Normal extraocular movements. ENT   Head: Normocephalic and atraumatic.   Nose: No congestion/rhinnorhea.   Mouth/Throat: Mucous membranes are moist. Emesis in the oropharynx.    Neck: No stridor. Hematological/Lymphatic/Immunilogical: No cervical lymphadenopathy. Cardiovascular: Tachycardic, irregular. No murmurs appreciated. Respiratory: Tachypniec, diffuse rhonchi. Gastrointestinal: Soft and nontender. No distention.  Genitourinary: Deferred Musculoskeletal: Normal range of motion in all extremities. No joint effusions.  No lower extremity tenderness nor edema. Neurologic:  Normal speech and language. No gross focal neurologic deficits are appreciated.  Skin:  Skin is warm, dry and intact. No rash noted. Surgical incision site on left hip clean, dry and intact. No surrounding erythema or warmth.  Psychiatric: Mood and affect are normal. Speech and behavior are normal. Patient exhibits appropriate insight and judgment.  ____________________________________________    LABS (pertinent positives/negatives)  Lactic Acid 2.5 UA WBC 0-5 Serum WBC 14.4  Trop <0.03 Cr 1.09 ____________________________________________   EKG  I, Phineas Semen, attending physician, personally viewed and interpreted this EKG  EKG Time: 1928 Rate: 86 Rhythm: NSR Axis: left axis deviation Intervals: qtc 535 QRS: widened, intraventricular delay ST changes: question some  elevation in III however potentially likley due to intraventricular delay3 Impression: abnormal ekg  ____________________________________________    RADIOLOGY  CXR  IMPRESSION: Bibasilar opacities, left greater than right. This may reflect atelectasis, multifocal pneumonia in the setting of fever, or aspiration. Possible left pleural effusion.   ____________________________________________   PROCEDURES  Procedure(s) performed: None  Critical Care performed: No  ____________________________________________  INITIAL IMPRESSION / ASSESSMENT AND PLAN / ED COURSE  Pertinent labs & imaging results that were available during my care of the patient were reviewed by me and considered in my medical decision making (see chart for details).  Patient presented to the emergency department today because of concerns for altered mental status. On initial exam patient was febrile and tachycardic. Code sepsis was called. Workup indicates that the source of infection is likely pneumonia. Patient was given broad-spectrum antibiotics. Patient was admitted to the hospital service for further workup and evaluation.  ____________________________________________   FINAL CLINICAL IMPRESSION(S) / ED DIAGNOSES  Final diagnoses:  Nausea & vomiting  Encephalopathy acute     Phineas Semen, MD 06/14/15 [phone]

## 2015-06-14 LAB — BASIC METABOLIC PANEL
ANION GAP: 5 (ref 5–15)
BUN: 20 mg/dL (ref 6–20)
CO2: 23 mmol/L (ref 22–32)
Calcium: 7.9 mg/dL — ABNORMAL LOW (ref 8.9–10.3)
Chloride: 109 mmol/L (ref 101–111)
Creatinine, Ser: 0.89 mg/dL (ref 0.61–1.24)
Glucose, Bld: 161 mg/dL — ABNORMAL HIGH (ref 65–99)
POTASSIUM: 5 mmol/L (ref 3.5–5.1)
SODIUM: 137 mmol/L (ref 135–145)

## 2015-06-14 LAB — GLUCOSE, CAPILLARY
GLUCOSE-CAPILLARY: 159 mg/dL — AB (ref 65–99)
GLUCOSE-CAPILLARY: 105 mg/dL — AB (ref 65–99)
GLUCOSE-CAPILLARY: 94 mg/dL (ref 65–99)
GLUCOSE-CAPILLARY: 108 mg/dL — AB (ref 65–99)

## 2015-06-14 LAB — CBC
HEMATOCRIT: 32.8 % — AB (ref 40.0–52.0)
HEMOGLOBIN: 11 g/dL — AB (ref 13.0–18.0)
MCH: 29.9 pg (ref 26.0–34.0)
MCHC: 33.5 g/dL (ref 32.0–36.0)
MCV: 89.4 fL (ref 80.0–100.0)
Platelets: 198 10*3/uL (ref 150–440)
RBC: 3.67 MIL/uL — ABNORMAL LOW (ref 4.40–5.90)
RDW: 13.8 % (ref 11.5–14.5)
WBC: 12.8 10*3/uL — AB (ref 3.8–10.6)

## 2015-06-14 LAB — HEMOGLOBIN A1C: HEMOGLOBIN A1C: 5.7 % (ref 4.0–6.0)

## 2015-06-14 LAB — PROTIME-INR
INR: 5.04 — AB
Prothrombin Time: 45.2 seconds — ABNORMAL HIGH (ref 11.4–15.0)

## 2015-06-14 LAB — MRSA PCR SCREENING: MRSA BY PCR: NEGATIVE

## 2015-06-14 LAB — LACTIC ACID, PLASMA: Lactic Acid, Venous: 1.9 mmol/L (ref 0.5–2.0)

## 2015-06-14 MED ORDER — FERROUS SULFATE 325 (65 FE) MG PO TABS
325.0000 mg | ORAL_TABLET | Freq: Every day | ORAL | Status: DC
  Administered 2015-06-15 – 2015-06-19 (×5): 325 mg via ORAL
  Filled 2015-06-14 (×5): qty 1

## 2015-06-14 MED ORDER — PIPERACILLIN-TAZOBACTAM 3.375 G IVPB
3.3750 g | Freq: Three times a day (TID) | INTRAVENOUS | Status: DC
  Administered 2015-06-14 – 2015-06-16 (×8): 3.375 g via INTRAVENOUS
  Filled 2015-06-14 (×10): qty 50

## 2015-06-14 MED ORDER — GABAPENTIN 100 MG PO CAPS
100.0000 mg | ORAL_CAPSULE | Freq: Every day | ORAL | Status: DC
  Administered 2015-06-14 – 2015-06-18 (×5): 100 mg via ORAL
  Filled 2015-06-14 (×5): qty 1

## 2015-06-14 MED ORDER — NITROGLYCERIN 0.4 MG SL SUBL: 0.4000 mg | SUBLINGUAL_TABLET | SUBLINGUAL | Status: DC | PRN

## 2015-06-14 MED ORDER — WARFARIN - PHARMACIST DOSING INPATIENT: Freq: Every day | Status: DC

## 2015-06-14 MED ORDER — PROMETHAZINE HCL 25 MG/ML IJ SOLN
12.5000 mg | Freq: Once | INTRAMUSCULAR | Status: DC
Start: 2015-06-14 — End: 2015-06-19
  Filled 2015-06-14: qty 1

## 2015-06-14 MED ORDER — SODIUM CHLORIDE 0.9 % IJ SOLN
3.0000 mL | Freq: Two times a day (BID) | INTRAMUSCULAR | Status: DC
  Administered 2015-06-14 – 2015-06-18 (×10): 3 mL via INTRAVENOUS

## 2015-06-14 MED ORDER — TAMSULOSIN HCL 0.4 MG PO CAPS
0.4000 mg | ORAL_CAPSULE | Freq: Two times a day (BID) | ORAL | Status: DC
Start: 2015-06-14 — End: 2015-06-19
  Administered 2015-06-14 – 2015-06-19 (×11): 0.4 mg via ORAL
  Filled 2015-06-14 (×11): qty 1

## 2015-06-14 MED ORDER — SODIUM CHLORIDE 0.9 % IV SOLN
INTRAVENOUS | Status: DC
  Administered 2015-06-14 – 2015-06-15 (×4): via INTRAVENOUS

## 2015-06-14 MED ORDER — PANTOPRAZOLE SODIUM 40 MG PO TBEC
40.0000 mg | DELAYED_RELEASE_TABLET | Freq: Every day | ORAL | Status: DC
  Administered 2015-06-14 – 2015-06-19 (×6): 40 mg via ORAL
  Filled 2015-06-14 (×6): qty 1

## 2015-06-14 MED ORDER — PRAVASTATIN SODIUM 20 MG PO TABS
10.0000 mg | ORAL_TABLET | Freq: Every day | ORAL | Status: DC
  Administered 2015-06-14 – 2015-06-18 (×5): 10 mg via ORAL
  Filled 2015-06-14 (×2): qty 2
  Filled 2015-06-14 (×3): qty 1

## 2015-06-14 MED ORDER — VANCOMYCIN HCL IN DEXTROSE 750-5 MG/150ML-% IV SOLN
750.0000 mg | INTRAVENOUS | Status: DC
  Administered 2015-06-14 – 2015-06-15 (×3): 750 mg via INTRAVENOUS
  Filled 2015-06-14 (×5): qty 150

## 2015-06-14 MED ORDER — OXYCODONE HCL 5 MG PO TABS
5.0000 mg | ORAL_TABLET | ORAL | Status: DC | PRN
  Administered 2015-06-14: 5 mg via ORAL
  Filled 2015-06-14: qty 1

## 2015-06-14 MED ORDER — INSULIN ASPART 100 UNIT/ML ~~LOC~~ SOLN
3.0000 [IU] | Freq: Three times a day (TID) | SUBCUTANEOUS | Status: DC
  Administered 2015-06-14 – 2015-06-15 (×5): 3 [IU] via SUBCUTANEOUS
  Filled 2015-06-14 (×5): qty 3

## 2015-06-14 MED ORDER — BISACODYL 10 MG RE SUPP: 10.0000 mg | Freq: Every day | RECTAL | Status: DC | PRN

## 2015-06-14 MED ORDER — INSULIN ASPART 100 UNIT/ML ~~LOC~~ SOLN
0.0000 [IU] | Freq: Three times a day (TID) | SUBCUTANEOUS | Status: DC
  Administered 2015-06-14: 2 [IU] via SUBCUTANEOUS
  Administered 2015-06-15: 1 [IU] via SUBCUTANEOUS
  Administered 2015-06-17 (×2): 2 [IU] via SUBCUTANEOUS
  Administered 2015-06-17: 1 [IU] via SUBCUTANEOUS
  Administered 2015-06-18: 2 [IU] via SUBCUTANEOUS
  Administered 2015-06-18: 1 [IU] via SUBCUTANEOUS
  Administered 2015-06-18: 2 [IU] via SUBCUTANEOUS
  Administered 2015-06-19: 3 [IU] via SUBCUTANEOUS
  Administered 2015-06-19: 1 [IU] via SUBCUTANEOUS
  Filled 2015-06-14: qty 3
  Filled 2015-06-14 (×2): qty 1
  Filled 2015-06-14 (×3): qty 2
  Filled 2015-06-14: qty 1
  Filled 2015-06-14 (×2): qty 2

## 2015-06-14 MED ORDER — WARFARIN SODIUM 4 MG PO TABS: 5.0000 mg | ORAL_TABLET | Freq: Every day | ORAL | Status: DC

## 2015-06-14 MED ORDER — ASPIRIN EC 81 MG PO TBEC
81.0000 mg | DELAYED_RELEASE_TABLET | Freq: Every day | ORAL | Status: DC
  Administered 2015-06-14 – 2015-06-15 (×2): 81 mg via ORAL
  Filled 2015-06-14 (×2): qty 1

## 2015-06-14 NOTE — Progress Notes (Signed)
Phenergan not given since patient denies nausea. Oxygen turned off at this time, will monitor patient. Oxycodone given for pain

## 2015-06-14 NOTE — Progress Notes (Signed)
ANTICOAGULATION CONSULT NOTE - Initial Consult  Pharmacy Consult for warfarin Indication: atrial fibrillation  No Known Allergies  Patient Measurements: Height: 5\' 7"  (170.2 cm) Weight: 173 lb 6 oz (78.642 kg) IBW/kg (Calculated) : 66.1  Vital Signs: Temp: 99 F (37.2 C) (12/11 1100) Temp Source: Other (Comment) (12/11 1200) BP: 94/46 mmHg (12/11 1200) Pulse Rate: 85 (12/11 1200)  Labs:  Recent Labs  06/12/15 2200 06/13/15 1948 06/14/15 0435 06/14/15 1140  HGB  --  12.3* 11.0*  --   HCT  --  37.1* 32.8*  --   PLT  --  270 198  --   LABPROT 34.6*  --   --  45.2*  INR 3.53  --   --  5.04*  CREATININE  --  1.09 0.89  --   TROPONINI  --  <0.03  --   --     Estimated Creatinine Clearance: 58.8 mL/min (by C-G formula based on Cr of 0.89).   Medical History: Past Medical History  Diagnosis Date  . Diabetes mellitus without complication (HCC)   . Hypertension   . A-fib (HCC)   . GERD (gastroesophageal reflux disease)   . CHF (congestive heart failure) (HCC)   . Fracture, femur [facility](HCC)     Assessment: 81 yo patient need anticoagulation with warfarin. Pharmacy consulted for dosing and monitoring.  Prior to admission patient was taking warfarin 5mg  daily. Patient was supratherapeutic on admission.   12/9: INR 3.53 12/11: INR 5.04  Goal of Therapy:  INR 2-3 Monitor platelets by anticoagulation protocol: Yes   Plan:  INR is still supratherapeutic. Will hold dose of warfarin today. INR ordered at 0500 tomorrow. Nurse is aware of elevated INR.  Pharmacy will continue to monitor labs and INR and order warfarin doses accordingly.   Cher NakaiSheema Jaylynn Siefert, PharmD Pharmacy Resident  06/14/2015,1:16 PM

## 2015-06-14 NOTE — Progress Notes (Signed)
Dr. Juliene PinaMody in to see patient and discontinued Venti mask and 3 liter nasal cannula applied. O2 sat is 93%. Family at bedside.

## 2015-06-14 NOTE — [place] FL2 (Signed)
[place] MEDICAID FL2 LEVEL OF CARE SCREENING TOOL     IDENTIFICATION  Patient Name: Riley Sanchez. Birthdate: [birth year] Sex: male Admission Date (Current Location): 06/13/2015  [place] and [place] Number:  Air cabin crew )   Facility and Address:  [facility], [address] [place], [place] [address]      Provider Number: [id number]  Attending Physician Name and Address:  Adrian Saran, MD  Relative Name and Phone Number:       Current Level of Care: Hospital Recommended Level of Care: Skilled Nursing Facility Prior Approval Number:    Date Approved/Denied:   PASRR Number:  [id number])  Discharge Plan: SNF    Current Diagnoses: Patient Active Problem List   Diagnosis Date Noted  . Sepsis (HCC) 06/13/2015  . Hip fracture (HCC) 05/18/2015    Orientation RESPIRATION BLADDER Height & Weight    Self, Time, Situation, Place  O2 Indwelling catheter  (170.2 cm) 173 lbs.  BEHAVIORAL SYMPTOMS/MOOD NEUROLOGICAL BOWEL NUTRITION STATUS      Continent Diet (Carb Modified)  AMBULATORY STATUS COMMUNICATION OF NEEDS Skin   Extensive Assist Verbally Surgical wounds, PU Stage and Appropriate Care (pressure Uler Sacrum and Right Heel)                       Personal Care Assistance Level of Assistance  Bathing, Feeding, Dressing Bathing Assistance: Limited assistance Feeding assistance: Independent Dressing Assistance: Limited assistance     Functional Limitations Info  Sight, Hearing, Speech Sight Info: Adequate Hearing Info: Adequate Speech Info: Adequate    SPECIAL CARE FACTORS FREQUENCY                       Contractures      Additional Factors Info  Allergies (.)   Allergies Info:  (Bactrim Sulfamethoxazole-trimethoprim)           Current Medications (06/14/2015):  This is the current hospital active medication list Current Facility-Administered Medications  Medication Dose Route Frequency Provider Last Rate  Last Dose  . 0.9 %  sodium chloride infusion   Intravenous Continuous Katharina Caper, MD 100 mL/hr at 06/14/15 0600    . acetaminophen (TYLENOL) tablet 650 mg  650 mg Oral Q6H PRN Katharina Caper, MD   650 mg at 06/14/15 0826   Or  . acetaminophen (TYLENOL) suppository 650 mg  650 mg Rectal Q6H PRN Katharina Caper, MD   650 mg at 06/13/15 2110  . bisacodyl (DULCOLAX) suppository 10 mg  10 mg Rectal Daily PRN Katharina Caper, MD      . insulin aspart (novoLOG) injection 0-9 Units  0-9 Units Subcutaneous TID WC Katharina Caper, MD   2 Units at 06/14/15 0741  . insulin aspart (novoLOG) injection 3 Units  3 Units Subcutaneous TID WC Katharina Caper, MD   3 Units at 06/14/15 0741  . nitroGLYCERIN (NITROSTAT) SL tablet 0.4 mg  0.4 mg Sublingual Q5 min PRN Katharina Caper, MD      . ondansetron (ZOFRAN) tablet 4 mg  4 mg Oral Q6H PRN Katharina Caper, MD       Or  . ondansetron (ZOFRAN) injection 4 mg  4 mg Intravenous Q6H PRN Katharina Caper, MD   4 mg at 06/13/15 2101  . piperacillin-tazobactam (ZOSYN) IVPB 3.375 g  3.375 g Intravenous 3 times per day Katharina Caper, MD   3.375 g at 06/14/15 0343  . sodium chloride 0.9 % injection 3 mL  3 mL Intravenous Q12H Rima  Winona LegatoVaickute, MD   3 mL at 06/14/15 0957  . vancomycin (VANCOCIN) IVPB 750 mg/150 ml premix  750 mg Intravenous Q18H Katharina Caperima Vaickute, MD   750 mg at 06/14/15 0541     Discharge Medications: Please see discharge summary for a list of discharge medications.  Relevant Imaging Results:  Relevant Lab Results:   Additional Information  (SS: [id number])  Soundra PilonMoore, Sheryle Vice H, LCSW

## 2015-06-14 NOTE — Clinical Social Work Note (Signed)
Clinical Social Work Assessment  Patient Details  Name: Riley BimlerMilton Kleier Jr. MRN: [id number] Date of Birth: 04-Apr-1930  Date of referral:  06/14/15               Reason for consult:   (From [facility])                Permission sought to share information with:  Facility Medical sales representativeContact Representative, Family Supports Permission granted to share information::  Yes, Verbal Permission Granted  Name::      (Wife Riley Sanchez [phone])  Agency::     Relationship::     Contact Information:     Housing/Transportation Living arrangements for the past 2 months:  Single Family Home Source of Information:  Patient, Spouse Patient Interpreter Needed:  None Criminal Activity/Legal Involvement Pertinent to Current Situation/Hospitalization:  No - Comment as needed Significant Relationships:  Adult Children, Other Family Members, Spouse Lives with:  Spouse Do you feel safe going back to the place where you live?  Yes Need for family participation in patient care:  Yes (Comment)  Care giving concerns:  None at this time   Office managerocial Worker assessment / plan:  Visual merchandiserClinical Social Worker (CSW) consult, patient is from [facility] for rehab.   Patient was alert, oriented min. engaged in conversation with CSW.  (Additional Information provided by wife Riley Sanchez at bedside).  CSW introduced self and explained role of CSW department  Patient currently lives with his wife of 61 years. Patient was recently discharge to [facility] for rehab on 05/22/15.  Per patient and wife state rehab is going well and the plan is for him to return at discharge.      CSW will complete FL2, and contact facility in anticipation of patient returning at discharge.   Employment status:  Disabled (Comment on whether or not currently receiving Disability) Insurance information:  Managed Medicare PT Recommendations:  Not assessed at this time (previous visit recommendation for rehab) Information / Referral to community resources:  Skilled Nursing  Facility  Patient/Family's Response to care:  Patient and wife was appreciative of talking with CSW.   Patient/Family's Understanding of and Emotional Response to Diagnosis, Current Treatment, and Prognosis:  Patient and wife understands that he is under continued medical work up at this time.  Once medically stable patient  will discharge back to [facility].  Emotional Assessment Appearance:  Appears stated age Attitude/Demeanor/Rapport:  Lethargic Affect (typically observed):  Accepting, Hopeful, Appropriate, Calm, Pleasant, Quiet Orientation:  Oriented to Self, Oriented to Place, Oriented to Situation Alcohol / Substance use:    Psych involvement (Current and /or in the community):  No (Comment)  Discharge Needs  Concerns to be addressed:  Care Coordination, Discharge Planning Concerns Readmission within the last 30 days:  Yes Current discharge risk:  Chronically ill, Dependent with Mobility Barriers to Discharge:  Continued Medical Work up, No Barriers Identified   Soundra PilonMoore, Achilles Neville H, LCSW 06/14/2015, 10:51 AM

## 2015-06-14 NOTE — Progress Notes (Signed)
eLink Physician-Brief Progress Note Patient Name: Alvy BimlerMilton Stoudt Jr. DOB: 1930-02-25 MRN: [id number]   Date of Service  06/14/2015  HPI/Events of Note  81 yo with sepsis, PNA.  Getting IV fluids, broad spectrum Abx.   eICU Interventions  No additional elink interventions at this time.     Intervention Category Major Interventions: Other:  Ranbir Chew 06/14/2015, 12:45 AM

## 2015-06-14 NOTE — Progress Notes (Signed)
PHARMACIST - PHYSICIAN COMMUNICATION  Per P&T Committee, maximum IV dose of promethazine (PHENERGAN) in a patient >/= 65 is 12.5mg .   Promethazine dose was changed to 12.5mg  to comply with P&T policy.   Cher NakaiSheema Tracy Kinner, PharmD Pharmacy Resident 06/14/2015 11:13 AM

## 2015-06-14 NOTE — Progress Notes (Signed)
Critical PT and INR called from lab. Notified both Dr Juliene PinaMody and pharmacist consulted for dosing. No new telephone orders at this time.

## 2015-06-14 NOTE — Progress Notes (Signed)
I notified Dr. Juliene PinaMody of positive blood culture

## 2015-06-14 NOTE — Progress Notes (Signed)
ANTIBIOTIC CONSULT NOTE - FOLLOW UP   Pharmacy Consult for vancomycin/zosyn Indication: rule out sepsis  No Known Allergies  Patient Measurements: Height:  (170.2 cm) Weight: 173 lb 6 oz (78.642 kg) IBW/kg (Calculated) : 66.1 Adjusted Body Weight: 68.1 kg  Vital Signs: Temp: 99 F (37.2 C) (12/11 0800) Temp Source: Oral (12/10 2340) BP: 103/53 mmHg (12/11 0800) Pulse Rate: 87 (12/11 0800) Intake/Output from previous day: 12/10 0701 - 12/11 0700 In: -  Out: 550 [Urine:550] Intake/Output from this shift:    Labs:  Recent Labs  06/13/15 1948 06/14/15 0435  WBC 14.4* 12.8*  HGB 12.3* 11.0*  PLT 270 198  CREATININE 1.09 0.89   Estimated Creatinine Clearance: 58.8 mL/min (by C-G formula based on Cr of 0.89). No results for input(s): VANCOTROUGH, VANCOPEAK, VANCORANDOM, GENTTROUGH, GENTPEAK, GENTRANDOM, TOBRATROUGH, TOBRAPEAK, TOBRARND, AMIKACINPEAK, AMIKACINTROU, AMIKACIN in the last 72 hours.   Microbiology: Recent Results (from the past 720 hour(s))  Urine culture     Status: None   Collection Time: 05/18/15  5:50 PM  Result Value Ref Range Status   Specimen Description URINE, CLEAN CATCH  Final   Special Requests NONE  Final   Culture NO GROWTH 2 DAYS  Final   Report Status 05/20/2015 FINAL  Final  MRSA PCR Screening     Status: None   Collection Time: 05/18/15  5:51 PM  Result Value Ref Range Status   MRSA by PCR NEGATIVE NEGATIVE Final    Comment:        The GeneXpert MRSA Assay (FDA approved for NASAL specimens only), is one component of a comprehensive MRSA colonization surveillance program. It is not intended to diagnose MRSA infection nor to guide or monitor treatment for MRSA infections.   Blood Culture (routine x 2)     Status: None (Preliminary result)   Collection Time: 06/13/15  7:40 PM  Result Value Ref Range Status   Specimen Description BLOOD LEFT ASSIST CONTROL  Final   Special Requests BOTTLES DRAWN AEROBIC AND ANAEROBIC 4CC  Final    Culture NO GROWTH < 12 HOURS  Final   Report Status PENDING  Incomplete  Blood Culture (routine x 2)     Status: None (Preliminary result)   Collection Time: 06/13/15  7:40 PM  Result Value Ref Range Status   Specimen Description BLOOD LEFT HAND  Final   Special Requests BOTTLES DRAWN AEROBIC AND ANAEROBIC 4CC  Final   Culture NO GROWTH < 12 HOURS  Final   Report Status PENDING  Incomplete  MRSA PCR Screening     Status: None   Collection Time: 06/13/15 11:48 PM  Result Value Ref Range Status   MRSA by PCR NEGATIVE NEGATIVE Final    Comment:        The GeneXpert MRSA Assay (FDA approved for NASAL specimens only), is one component of a comprehensive MRSA colonization surveillance program. It is not intended to diagnose MRSA infection nor to guide or monitor treatment for MRSA infections.     Medical History: Past Medical History  Diagnosis Date  . Diabetes mellitus without complication (HCC)   . Hypertension   . A-fib (HCC)   . GERD (gastroesophageal reflux disease)   . CHF (congestive heart failure) (HCC)   . Fracture, femur (HCC)     Medications:  Infusions:  . sodium chloride 100 mL/hr at 06/14/15 0600   Assessment: 85 yom cc fever/AMS/tachycardia. Recent admission in November 2016 for impacted left femoral neck fracture sp ORIF 05/19/15. Had choking  sensation after vomiting, ED noted fever 103, tachypnic, hypoxia around 85%. Starting broad spectrum for r/o aspiration and sepsis.   Vd 47.6, T1/2 17.6 hr, Ke 0.0393 hr-1   Goal of Therapy:  Vancomycin trough level 15-20 mcg/ml  Plan:  Expected duration 7 days with resolution of temperature and/or normalization of WBC. Zosyn 3.375 gm IV Q8H EI   Will continue Vancomycin 750 mg IV q18 hours. Trough level ordered 1213 1130.   Pharmacy to follow.   Delainie Chavana D, Pharm.D., BCPS Clinical Pharmacist 06/14/2015,8:31 AM

## 2015-06-14 NOTE — Progress Notes (Signed)
[facility] [place] [facility]   PATIENT NAME: Riley CruiseMilton Sanchez    MR#:  [id number]  DATE OF BIRTH:  10-26-1929  SUBJECTIVE:    Patient reports he is breathing better today. Denies chest pain or shortness of breath. Still on high flow oxygen.  REVIEW OF SYSTEMS:    Review of Systems  Constitutional: Negative for fever, chills and malaise/fatigue.  HENT: Negative for sore throat.   Eyes: Negative for blurred vision.  Respiratory: Positive for cough. Negative for hemoptysis, shortness of breath and wheezing.   Cardiovascular: Negative for chest pain, palpitations and leg swelling.  Gastrointestinal: Negative for nausea, vomiting, abdominal pain, diarrhea and blood in stool.  Genitourinary: Negative for dysuria.  Musculoskeletal: Positive for joint pain. Negative for back pain.  Neurological: Positive for weakness. Negative for dizziness, tremors and headaches.  Endo/Heme/Allergies: Does not bruise/bleed easily.    Tolerating Diet: Nothing by mouth      DRUG ALLERGIES:  No Known Allergies  VITALS:  Blood pressure 85/49, pulse 74, temperature 99 F (37.2 C), temperature source Oral, resp. rate 21, height 5\' 7"  (1.702 m), weight 78.642 kg (173 lb 6 oz), SpO2 93 %.  PHYSICAL EXAMINATION:   Physical Exam  Constitutional: He is oriented to person, place, and time and well-developed, well-nourished, and in no distress. No distress.  HENT:  Head: Normocephalic.  Eyes: No scleral icterus.  Neck: Normal range of motion. Neck supple. No JVD present. No tracheal deviation present.  Cardiovascular: Normal rate, regular rhythm and normal heart sounds.  Exam reveals no gallop and no friction rub.   No murmur heard. Pulmonary/Chest: Effort normal and breath sounds normal. No respiratory distress. He has no wheezes. He has no rales. He exhibits no tenderness.  Abdominal: Soft. Bowel sounds are normal. He exhibits no distension and no mass. There is no  tenderness. There is no rebound and no guarding.  Musculoskeletal: Normal range of motion. He exhibits no edema.  Neurological: He is alert and oriented to person, place, and time.  Skin: Skin is warm. No rash noted. No erythema.  Pressure ulcer right heel present on admission  Psychiatric: Affect and judgment normal.      LABORATORY PANEL:   CBC  Recent Labs Lab 06/14/15 0435  WBC 12.8*  HGB 11.0*  HCT 32.8*  PLT 198   ------------------------------------------------------------------------------------------------------------------  Chemistries   Recent Labs Lab 06/13/15 1948 06/14/15 0435  NA 136 137  K 5.1 5.0  CL 105 109  CO2 23 23  GLUCOSE 216* 161*  BUN 19 20  CREATININE 1.09 0.89  CALCIUM 8.6* 7.9*  AST 13*  --   ALT 9*  --   ALKPHOS 67  --   BILITOT 1.3*  --    ------------------------------------------------------------------------------------------------------------------  Cardiac Enzymes  Recent Labs Lab 06/13/15 1948  TROPONINI <0.03   ------------------------------------------------------------------------------------------------------------------  RADIOLOGY:  Ct Head Wo Contrast  06/13/2015  CLINICAL DATA:  Acute encephalopathy.  Nausea and vomiting. EXAM: CT HEAD WITHOUT CONTRAST TECHNIQUE: Contiguous axial images were obtained from the base of the skull through the vertex without intravenous contrast. COMPARISON:  Head CT 05/18/2015 FINDINGS: No intracranial hemorrhage, mass effect, or midline shift. Stable atrophy and chronic small vessel ischemia from prior exam. Remote lacunar infarcts in the bilateral basal ganglia and right cerebellum, unchanged. No hydrocephalus. The basilar cisterns are patent. No evidence of territorial infarct. No intracranial fluid collection. Atherosclerosis of skullbase vasculature. Calvarium is intact. Chronic opacification of right side of sphenoid sinus with small  fluid level and mucosal thickening in the left  side of sphenoid sinus and throughout the ethmoid air cells, with minimal improvement from prior. Mastoid air cells are well aerated. IMPRESSION: 1. Stable atrophy and chronic small vessel ischemia without acute intracranial abnormality. 2. Chronic sinusitis, with mild improvement from prior. Electronically Signed   By: Rubye Oaks M.D.   On: 06/13/2015 22:00   Dg Chest Port 1 View  06/13/2015  CLINICAL DATA:  Altered mental status with fever and tachycardia. EXAM: PORTABLE CHEST 1 VIEW COMPARISON:  05/18/2015 FINDINGS: Multi lead left-sided pacemaker remains in place. Patient is post median sternotomy. Development of opacity at the left lung base obscuring the left hemidiaphragm. Development of opacity in the medial right lung base. Cardiomegaly and mediastinal contours are unchanged. Question vascular congestion, no overt edema. No pneumothorax. Osseous structures are unchanged. IMPRESSION: Bibasilar opacities, left greater than right. This may reflect atelectasis, multifocal pneumonia in the setting of fever, or aspiration. Possible left pleural effusion. Electronically Signed   By: Rubye Oaks M.D.   On: 06/13/2015 19:25   Dg Abd 2 Views  06/13/2015  CLINICAL DATA:  Nausea and vomiting. EXAM: ABDOMEN - 2 VIEW COMPARISON:  Chest radiographs earlier this day. FINDINGS: Bibasilar airspace opacities. No dilated small bowel loops. No bowel obstruction. No free intra-abdominal air. Small volume of stool throughout the colon, with stool ball distending the rectum. Atherosclerosis noted throughout the abdominal vasculature. Three partially cannulated screws traverse the left hip. IMPRESSION: Nonobstructive bowel gas pattern, no free air. Small stool burden, with stool ball distending the rectum. Electronically Signed   By: Rubye Oaks M.D.   On: 06/13/2015 21:55     ASSESSMENT AND PLAN:   81 year old male who was status post hip surgery on November 15 who presented from nursing home with  sepsis and acute hypoxic respiratory failure.  1. Sepsis: This is due to bilateral basal pneumonia. I suspect this is related to aspiration. Patient is on broad-spectrum antibiotics including vancomycin, Zosyn and Levaquin. I suggest we continue these antibiotics and narrow antibiotics in 24-48 hours. Continue to follow up on blood culture.  2. Pneumonia: Healthcare acquired versus aspiration pneumonia: Speech consultation will be placed for tomorrow. Continue aspiration precautions. Continue broad-spectrum antibiotics.  3. Acute hypoxic respiratory failure: This is secondary to pneumonia. Plan as outlined above. Wean oxygen as tolerated.  4. Recent hip surgery: Patient is non bearing weight on left leg.  5. Heel ulcer present on admission: 1. Consultation.  6. Diabetes type 2 without complication: Continue sliding scale insulin, ADA diet  7. PAF: Continue Coumadin as per pharmacy consultation.  8. History of ischemic cardiomyopathy with CABG: Continue aspirin and lisinopril and statin.  9. BPH: Continue Flomax  Management plans discussed with the patient and he is in agreement.  CODE STATUS: FULL  TOTAL TIME TAKING CARE OF THIS PATIENT: 30 minutes.     POSSIBLE D/C 2-4 days, DEPENDING ON CLINICAL CONDITION.   Carolyne Whitsel M.D on 06/14/2015 at 10:44 AM  Between 7am to 6pm - Pager - [phone] After 6pm go to [email or web address] - password [email or web address]  [facility]  Office  [phone]  CC: Primary care physician; Sula Rumple, MD  Note: This dictation was prepared with Dragon dictation along with smaller phrase technology. Any transcriptional errors that result from this process are unintentional.

## 2015-06-14 NOTE — Progress Notes (Signed)
Received from [facility]. Pt is a xo x3. But lethargic. VSS. No complaints of nausea  Or vomitting.resting in bed Family at bed side. Will continue to observe closely.

## 2015-06-15 ENCOUNTER — Inpatient Hospital Stay: Payer: Medicare Other

## 2015-06-15 DIAGNOSIS — R310 Gross hematuria: Secondary | ICD-10-CM | POA: Insufficient documentation

## 2015-06-15 LAB — CBC
HEMATOCRIT: 29.7 % — AB (ref 40.0–52.0)
Hemoglobin: 9.8 g/dL — ABNORMAL LOW (ref 13.0–18.0)
MCH: 29.6 pg (ref 26.0–34.0)
MCHC: 33 g/dL (ref 32.0–36.0)
MCV: 89.7 fL (ref 80.0–100.0)
PLATELETS: 173 10*3/uL (ref 150–440)
RBC: 3.31 MIL/uL — ABNORMAL LOW (ref 4.40–5.90)
RDW: 14.2 % (ref 11.5–14.5)
WBC: 10.6 10*3/uL (ref 3.8–10.6)

## 2015-06-15 LAB — PROTIME-INR
INR: 5.53 — AB
Prothrombin Time: 48.5 seconds — ABNORMAL HIGH (ref 11.4–15.0)

## 2015-06-15 LAB — HEMOGLOBIN AND HEMATOCRIT, BLOOD
HCT: 26 % — ABNORMAL LOW (ref 40.0–52.0)
Hemoglobin: 8.8 g/dL — ABNORMAL LOW (ref 13.0–18.0)

## 2015-06-15 LAB — URINALYSIS COMPLETE WITH MICROSCOPIC (ARMC ONLY)
BACTERIA UA: NONE SEEN
BILIRUBIN URINE: NEGATIVE
Glucose, UA: NEGATIVE mg/dL
Ketones, ur: NEGATIVE mg/dL
Leukocytes, UA: NEGATIVE
Nitrite: NEGATIVE
PH: 5 (ref 5.0–8.0)
Protein, ur: 30 mg/dL — AB
Specific Gravity, Urine: 1.029 (ref 1.005–1.030)

## 2015-06-15 LAB — BASIC METABOLIC PANEL
Anion gap: 6 (ref 5–15)
BUN: 18 mg/dL (ref 6–20)
CALCIUM: 8 mg/dL — AB (ref 8.9–10.3)
CO2: 23 mmol/L (ref 22–32)
CREATININE: 0.89 mg/dL (ref 0.61–1.24)
Chloride: 110 mmol/L (ref 101–111)
GFR calc Af Amer: >60 mL/min (ref >60)
GFR calc non Af Amer: >60 mL/min (ref >60)
GLUCOSE: 120 mg/dL — AB (ref 65–99)
Potassium: 3.8 mmol/L (ref 3.5–5.1)
Sodium: 139 mmol/L (ref 135–145)

## 2015-06-15 LAB — GLUCOSE, CAPILLARY
Glucose-Capillary: 112 mg/dL — ABNORMAL HIGH (ref 65–99)
Glucose-Capillary: 127 mg/dL — ABNORMAL HIGH (ref 65–99)
Glucose-Capillary: 115 mg/dL — ABNORMAL HIGH (ref 65–99)
Glucose-Capillary: 116 mg/dL — ABNORMAL HIGH (ref 65–99)

## 2015-06-15 LAB — C DIFFICILE QUICK SCREEN W PCR REFLEX
C DIFFICILE (CDIFF) TOXIN: NEGATIVE
C DIFFICLE (CDIFF) ANTIGEN: NEGATIVE
C Diff interpretation: NEGATIVE

## 2015-06-15 LAB — URINE CULTURE: Culture: NO GROWTH

## 2015-06-15 MED ORDER — HYDROCOD POLST-CPM POLST ER 10-8 MG/5ML PO SUER
5.0000 mL | Freq: Two times a day (BID) | ORAL | Status: DC | PRN
  Administered 2015-06-15 – 2015-06-19 (×5): 5 mL via ORAL
  Filled 2015-06-15 (×5): qty 5

## 2015-06-15 MED ORDER — MAGIC MOUTHWASH
10.0000 mL | Freq: Four times a day (QID) | ORAL | Status: DC
  Administered 2015-06-15 – 2015-06-19 (×15): 10 mL via ORAL
  Filled 2015-06-15 (×18): qty 10

## 2015-06-15 MED ORDER — INFLUENZA VAC SPLIT QUAD 0.5 ML IM SUSY
0.5000 mL | PREFILLED_SYRINGE | INTRAMUSCULAR | Status: AC
  Administered 2015-06-19: 0.5 mL via INTRAMUSCULAR
  Filled 2015-06-15: qty 0.5

## 2015-06-15 MED ORDER — PHYTONADIONE 5 MG PO TABS
5.0000 mg | ORAL_TABLET | Freq: Once | ORAL | Status: AC
  Administered 2015-06-15: 5 mg via ORAL
  Filled 2015-06-15: qty 1

## 2015-06-15 NOTE — Care Management Note (Signed)
Case Management Note  Patient Details  Name: Riley BimlerMilton Vanegas Jr. MRN: [id number] Date of Birth: September 25, 1929  Subjective/Objective:     81yo Mr Riley Sanchez was admitted from [facility] on 06/13/15 per fever. Dx: sepsis from PNA.  Status post left hip ORIF on 05/18/15, and is still non-weight baring on that leg. On Coumadin for A-Fib. INR=5.53 today. Has a right heel ulcer. Hx: CABG, A-Fib, left hip fx. PCP=Dr Charanjit Virk. Pharmacy[place] [place]. Married and will return home with wife after rehab at [facility] is completed. Current discharge plan is to return to [facility] after this hospitalization. Has a rolling walker at home. No home oxygen or home health providers. [facility]-Social Work is following for discharge planning.     Action/Plan:   Expected Discharge Date:                  Expected Discharge Plan:     In-House Referral:     Discharge planning Services     Post Acute Care Choice:    Choice offered to:     DME Arranged:    DME Agency:     HH Arranged:    HH Agency:     Status of Service:     Medicare Important Message Given:    Date Medicare IM Given:    Medicare IM give by:    Date Additional Medicare IM Given:    Additional Medicare Important Message give by:     If discussed at Long Length of Stay Meetings, dates discussed:    Additional Comments:  Riley Sanchez A, RN 06/15/2015, 2:45 PM

## 2015-06-15 NOTE — Care Management (Signed)
Patient presents from [facility].  Admitted with sepsis.  Had discharge out two and a half weeks ago  after sustaining a hip fracture  and had orif.  He is non weight bearing.  Have confirmed this order is correct.  Discussed the need to order physical therapy during progression to prevent decline in current functional status.  It is reported that patient  was assessed by Dr Martha ClanKrasinski just prior to admission and his non weight bearing status was to continue.  One of the pblood cultures appear to be positive but it is thought to be contaminate.  If third one is positive, attending will consult orthopedics.

## 2015-06-15 NOTE — Progress Notes (Signed)
ANTIBIOTIC CONSULT NOTE - FOLLOW UP   Pharmacy Consult for vancomycin/zosyn Indication: rule out sepsis  No Known Allergies  Patient Measurements: Height:  (170.2 cm) Weight: 173 lb 6 oz (78.642 kg) IBW/kg (Calculated) : 66.1 Adjusted Body Weight: 68.1 kg  Vital Signs: Temp: 100.6 F (38.1 C) (12/12 0900) BP: 132/56 mmHg (12/12 1000) Pulse Rate: 67 (12/12 1000) Intake/Output from previous day: 12/11 0701 - 12/12 0700 In: 2350 [I.V.:2300; IV Piggyback:50] Out: 850 [Urine:850] Intake/Output from this shift: Total I/O In: 300 [I.V.:300] Out: -   Labs:  Recent Labs  06/13/15 1948 06/14/15 0435 06/15/15 0507  WBC 14.4* 12.8* 10.6  HGB 12.3* 11.0* 9.8*  PLT 270 198 173  CREATININE 1.09 0.89 0.89   Estimated Creatinine Clearance: 58.8 mL/min (by C-G formula based on Cr of 0.89). No results for input(s): VANCOTROUGH, VANCOPEAK, VANCORANDOM, GENTTROUGH, GENTPEAK, GENTRANDOM, TOBRATROUGH, TOBRAPEAK, TOBRARND, AMIKACINPEAK, AMIKACINTROU, AMIKACIN in the last 72 hours.   Microbiology: Recent Results (from the past 720 hour(s))  Urine culture     Status: None   Collection Time: 05/18/15  5:50 PM  Result Value Ref Range Status   Specimen Description URINE, CLEAN CATCH  Final   Special Requests NONE  Final   Culture NO GROWTH 2 DAYS  Final   Report Status 05/20/2015 FINAL  Final  MRSA PCR Screening     Status: None   Collection Time: 05/18/15  5:51 PM  Result Value Ref Range Status   MRSA by PCR NEGATIVE NEGATIVE Final    Comment:        The GeneXpert MRSA Assay (FDA approved for NASAL specimens only), is one component of a comprehensive MRSA colonization surveillance program. It is not intended to diagnose MRSA infection nor to guide or monitor treatment for MRSA infections.   Blood Culture (routine x 2)     Status: None (Preliminary result)   Collection Time: 06/13/15  7:40 PM  Result Value Ref Range Status   Specimen Description BLOOD LEFT ASSIST CONTROL   Final   Special Requests BOTTLES DRAWN AEROBIC AND ANAEROBIC 4CC  Final   Culture  Setup Time   Final    GRAM POSITIVE COCCI ANAEROBIC BOTTLE ONLY CRITICAL RESULT CALLED TO, READ BACK BY AND VERIFIED WITH: CHERYL SMITH,RN 06/14/2015 1405 BY JRS.    Culture   Final    GRAM POSITIVE COCCI ANAEROBIC BOTTLE ONLY IDENTIFICATION TO FOLLOW    Report Status PENDING  Incomplete  Blood Culture (routine x 2)     Status: None (Preliminary result)   Collection Time: 06/13/15  7:40 PM  Result Value Ref Range Status   Specimen Description BLOOD LEFT HAND  Final   Special Requests BOTTLES DRAWN AEROBIC AND ANAEROBIC 4CC  Final   Culture NO GROWTH < 12 HOURS  Final   Report Status PENDING  Incomplete  Urine culture     Status: None   Collection Time: 06/13/15  7:41 PM  Result Value Ref Range Status   Specimen Description URINE, RANDOM  Final   Special Requests NONE  Final   Culture NO GROWTH 2 DAYS  Final   Report Status 06/15/2015 FINAL  Final  MRSA PCR Screening     Status: None   Collection Time: 06/13/15 11:48 PM  Result Value Ref Range Status   MRSA by PCR NEGATIVE NEGATIVE Final    Comment:        The GeneXpert MRSA Assay (FDA approved for NASAL specimens only), is one component of a comprehensive MRSA  colonization surveillance program. It is not intended to diagnose MRSA infection nor to guide or monitor treatment for MRSA infections.     Medical History: Past Medical History  Diagnosis Date  . Diabetes mellitus without complication (HCC)   . Hypertension   . A-fib (HCC)   . GERD (gastroesophageal reflux disease)   . CHF (congestive heart failure) (HCC)   . Fracture, femur (HCC)     Medications:  Infusions:  . sodium chloride 100 mL/hr at 06/14/15 2358   Assessment: 85 yom cc fever/AMS/tachycardia. Recent admission in November 2016 for impacted left femoral neck fracture sp ORIF 05/19/15. Had choking sensation after vomiting, ED noted fever 103, tachypnic, hypoxia  around 85%. Starting broad spectrum for r/o aspiration and sepsis.   Goal of Therapy:  Vancomycin trough level 15-20 mcg/ml  Plan:  Expected duration 7 days with resolution of temperature and/or normalization of WBC. Continue Zosyn 3.375 gm IV Q8H EI   Will continue Vancomycin 750 mg IV q18 hours. Trough level ordered 1213 1130.   Pharmacy to follow.   Luisa Harthristy, Natallia Stellmach D, Pharm.D., BCPS Clinical Pharmacist 06/15/2015,11:33 AM

## 2015-06-15 NOTE — Progress Notes (Signed)
Paged MD- requested c. Diff culture due to pt has had 2 loose stools today and yesterday. Also, confirmed to hold Insulin 3 units this evening d/t pt BG 115 and not eating. Per Dr. Renae GlossWieting, ok to hold insulin and obtain culture.

## 2015-06-15 NOTE — Progress Notes (Signed)
Zofran given for nausea.

## 2015-06-15 NOTE — Progress Notes (Signed)
ANTICOAGULATION CONSULT NOTE - Initial Consult  Pharmacy Consult for warfarin Indication: atrial fibrillation  No Known Allergies  Patient Measurements: Height: 5\' 7"  (170.2 cm) Weight: 173 lb 6 oz (78.642 kg) IBW/kg (Calculated) : 66.1  Vital Signs: Temp: 100.6 F (38.1 C) (12/12 0900) BP: 132/56 mmHg (12/12 1000) Pulse Rate: 67 (12/12 1000)  Labs:  Recent Labs  06/12/15 2200  06/13/15 1948 06/14/15 0435 06/14/15 1140 06/15/15 0507  HGB  --   < > 12.3* 11.0*  --  9.8*  HCT  --   --  37.1* 32.8*  --  29.7*  PLT  --   --  270 198  --  173  LABPROT 34.6*  --   --   --  45.2* 48.5*  INR 3.53  --   --   --  5.04* 5.53*  CREATININE  --   --  1.09 0.89  --  0.89  TROPONINI  --   --  <0.03  --   --   --   < > = values in this interval not displayed.  Estimated Creatinine Clearance: 58.8 mL/min (by C-G formula based on Cr of 0.89).   Medical History: Past Medical History  Diagnosis Date  . Diabetes mellitus without complication (HCC)   . Hypertension   . A-fib (HCC)   . GERD (gastroesophageal reflux disease)   . CHF (congestive heart failure) (HCC)   . Fracture, femur [facility](HCC)     Assessment: 81 yo patient need anticoagulation with warfarin. Pharmacy consulted for dosing and monitoring.  Prior to admission patient was taking warfarin 5mg  daily. Patient was supratherapeutic on admission.   12/9: INR 3.53 12/11: INR 5.04 12/12: INR 5.53  Goal of Therapy:  INR 2-3 Monitor platelets by anticoagulation protocol: Yes   Plan:  INR remains supratherapeutic. Will hold dose of warfarin today. INR ordered at 0500 tomorrow.   Luisa HartScott Alishia Lebo, PharmD   06/15/2015,11:30 AM

## 2015-06-15 NOTE — Progress Notes (Signed)
[facility] -  at [facility]   PATIENT NAME: Riley Sanchez    MR#:  [id number]  DATE OF BIRTH:  02-19-1944  SUBJECTIVE:   Patient has poor by mouth intake. Patient reports that he is feeling better than he did yesterday. Patient is now on nasal cannula. Patient had some nausea this morning when he smelled breakfast food. He passed a swallow evaluation. There are no signs of aspiration. He denies abdominal pain or nausea or vomiting.  REVIEW OF SYSTEMS:    Review of Systems  Constitutional: Negative for fever, chills and malaise/fatigue.       Poor by mouth intake  HENT: Negative for sore throat.   Eyes: Negative for blurred vision.  Respiratory: Positive for cough. Negative for hemoptysis, shortness of breath and wheezing.   Cardiovascular: Negative for chest pain, palpitations and leg swelling.  Gastrointestinal: Negative for heartburn, nausea, vomiting, abdominal pain, diarrhea and blood in stool.  Genitourinary: Negative for dysuria.  Musculoskeletal: Positive for joint pain. Negative for back pain.  Neurological: Positive for weakness. Negative for dizziness, tremors and headaches.  Endo/Heme/Allergies: Does not bruise/bleed easily.    Tolerating Diet: yes     DRUG ALLERGIES:  No Known Allergies  VITALS:  Blood pressure 132/56, pulse 67, temperature 100.6 F (38.1 C), temperature source Other (Comment), resp. rate 19, height  (1.702 m), weight 78.642 kg (173 lb 6 oz), SpO2 95 %.  PHYSICAL EXAMINATION:   Physical Exam  Constitutional: He is oriented to person, place, and time and well-developed, well-nourished, and in no distress. No distress.  HENT:  Head: Normocephalic.  Eyes: No scleral icterus.  Neck: Normal range of motion. Neck supple. No JVD present. No tracheal deviation present.  Cardiovascular: Normal rate, regular rhythm and normal heart sounds.  Exam reveals no gallop and no friction rub.   No murmur  heard. Pulmonary/Chest: Effort normal and breath sounds normal. No respiratory distress. He has no wheezes. He has no rales. He exhibits no tenderness.  Abdominal: Soft. Bowel sounds are normal. He exhibits no distension and no mass. There is no tenderness. There is no rebound and no guarding.  Musculoskeletal: Normal range of motion. He exhibits no edema.  Neurological: He is alert and oriented to person, place, and time.  Skin: Skin is warm. No rash noted. No erythema.  Pressure ulcer right heel present on admission  Psychiatric: Affect and judgment normal.      LABORATORY PANEL:   CBC  Recent Labs Lab 06/15/15 0507  WBC 10.6  HGB 9.8*  HCT 29.7*  PLT 173   ------------------------------------------------------------------------------------------------------------------  Chemistries   Recent Labs Lab 06/13/15 1948  06/15/15 0507  NA 136  < > 139  K 5.1  < > 3.8  CL 105  < > 110  CO2 23  < > 23  GLUCOSE 216*  < > 120*  BUN 19  < > 18  CREATININE 1.09  < > 0.89  CALCIUM 8.6*  < > 8.0*  AST 13*  --   --   ALT 9*  --   --   ALKPHOS 67  --   --   BILITOT 1.3*  --   --   < > = values in this interval not displayed. ------------------------------------------------------------------------------------------------------------------  Cardiac Enzymes  Recent Labs Lab 06/13/15 1948  TROPONINI <0.03   ------------------------------------------------------------------------------------------------------------------  RADIOLOGY:  Dg Chest 1 View  06/15/2015  CLINICAL DATA:  Pneumonia, sepsis ; history of CHF and recent surgery for left  hip fracture EXAM: CHEST 1 VIEW COMPARISON:  Portable chest x-ray of June 13, 2015 FINDINGS: The lungs are adequately inflated. Bibasilar pneumonia persists. There is no significant pleural effusion. The cardiac silhouette is mildly enlarged. The pulmonary vascularity is not engorged. A permanent pacemaker defibrillator is in reasonable  position. The patient has undergone previous CABG. The observed bony thorax exhibits no acute abnormality. IMPRESSION: Persistent bibasilar infiltrates. No significant pleural effusion is evident. Stable cardiomegaly without significant pulmonary vascular congestion. Electronically Signed   By: David  SwazilandJordan M.D.   On: 06/15/2015 07:48   Ct Head Wo Contrast  06/13/2015  CLINICAL DATA:  Acute encephalopathy.  Nausea and vomiting. EXAM: CT HEAD WITHOUT CONTRAST TECHNIQUE: Contiguous axial images were obtained from the base of the skull through the vertex without intravenous contrast. COMPARISON:  Head CT 05/18/2015 FINDINGS: No intracranial hemorrhage, mass effect, or midline shift. Stable atrophy and chronic small vessel ischemia from prior exam. Remote lacunar infarcts in the bilateral basal ganglia and right cerebellum, unchanged. No hydrocephalus. The basilar cisterns are patent. No evidence of territorial infarct. No intracranial fluid collection. Atherosclerosis of skullbase vasculature. Calvarium is intact. Chronic opacification of right side of sphenoid sinus with small fluid level and mucosal thickening in the left side of sphenoid sinus and throughout the ethmoid air cells, with minimal improvement from prior. Mastoid air cells are well aerated. IMPRESSION: 1. Stable atrophy and chronic small vessel ischemia without acute intracranial abnormality. 2. Chronic sinusitis, with mild improvement from prior. Electronically Signed   By: Rubye OaksMelanie  Ehinger M.D.   On: 06/13/2015 22:00   Dg Chest Port 1 View  06/13/2015  CLINICAL DATA:  Altered mental status with fever and tachycardia. EXAM: PORTABLE CHEST 1 VIEW COMPARISON:  05/18/2015 FINDINGS: Multi lead left-sided pacemaker remains in place. Patient is post median sternotomy. Development of opacity at the left lung base obscuring the left hemidiaphragm. Development of opacity in the medial right lung base. Cardiomegaly and mediastinal contours are unchanged.  Question vascular congestion, no overt edema. No pneumothorax. Osseous structures are unchanged. IMPRESSION: Bibasilar opacities, left greater than right. This may reflect atelectasis, multifocal pneumonia in the setting of fever, or aspiration. Possible left pleural effusion. Electronically Signed   By: Rubye OaksMelanie  Ehinger M.D.   On: 06/13/2015 19:25   Dg Abd 2 Views  06/13/2015  CLINICAL DATA:  Nausea and vomiting. EXAM: ABDOMEN - 2 VIEW COMPARISON:  Chest radiographs earlier this day. FINDINGS: Bibasilar airspace opacities. No dilated small bowel loops. No bowel obstruction. No free intra-abdominal air. Small volume of stool throughout the colon, with stool ball distending the rectum. Atherosclerosis noted throughout the abdominal vasculature. Three partially cannulated screws traverse the left hip. IMPRESSION: Nonobstructive bowel gas pattern, no free air. Small stool burden, with stool ball distending the rectum. Electronically Signed   By: Rubye OaksMelanie  Ehinger M.D.   On: 06/13/2015 21:55     ASSESSMENT AND PLAN:   81 year old male who was status post hip surgery on November 15 who presented from nursing home with sepsis and acute hypoxic respiratory failure.  1. Sepsis: This is due to bilateral basal pneumonia/ Patient is on broad-spectrum antibiotics including vancomycin, Zosyn and Levaquin. His blood culture 1 out of 2 bottles his GPC. I suspect this is a contaminant. I have repeated blood cultures. He did have a fever this morning. If he continues to have fevers I will consult infectious disease.  2. Pneumonia: Healthcare pneumonia: Patient passed swallow evaluation. C Continue broad-spectrum antibiotics.  3. Acute hypoxic respiratory failure:  This is secondary to pneumonia. Plan as outlined above. Wean oxygen as tolerated.  4. Recent hip surgery: Patient is non bearing weight on left leg.  5. Heel ulcer present on admission: As per wound care consultation Cleanse right heel with soap and  water and pat gently dry. Apply calcium alginate to wound bed. Cover with 4x4 gauze and secure with kerlix and tape.Change daily. Will add Prevalon boot to right heel to offload pressure.   6. Diabetes type 2 without complication: Continue sliding scale insulin, ADA diet  7. PAF: Continue Coumadin as per pharmacy consultation.  8. History of ischemic cardiomyopathy with CABG: Continue aspirin and lisinopril and statin.  9. BPH: Continue Flomax  Management plans discussed with the patient and he is in agreement.  CODE STATUS: FULL  TOTAL TIME TAKING CARE OF THIS PATIENT: 25 minutes.  Okay to transfer to floor   POSSIBLE D/C 2-4 days, DEPENDING ON CLINICAL CONDITION.   Tess Potts M.D on 06/15/2015 at 11:13 AM  Between 7am to 6pm - Pager - [phone] After 6pm go to [email or web address] - password [email or web address]  [facility]  Office  [phone]  CC: Primary care physician; Sula Rumple, MD  Note: This dictation was prepared with Dragon dictation along with smaller phrase technology. Any transcriptional errors that result from this process are unintentional.

## 2015-06-15 NOTE — Consult Note (Signed)
WOC wound consult note Reason for Consult:Stage 3 pressure injury to right heel.  Present on admission.  Recent fracture of left hip.  Edema present to bilateral feet.  Resides in Rehab facility presently. States he is wearing a heel protector at the facility but that his heels rub on the bed.  Wound type: Stage 3 pressure injury Pressure Ulcer POA: Yes Measurement:4 cm x 4 cm x 0.2 cm to right heel.  Flap of skin from ruptured blister is trimmed with sterile scissors today.  Wound bed:100% pink and moist Drainage (amount, consistency, odor) Minimal serosanguinous drainage.  No odor. Periwound: Intact Dressing procedure/placement/frequency:Cleanse right heel with soap and water and pat gently dry.  Apply calcium alginate to wound bed.  Cover with 4x4 gauze and secure with kerlix and tape.Change daily. Will add Prevalon boot to right heel to offload pressure.   Will not follow at this time.  Please re-consult if needed.  Maple HudsonKaren Harpreet Signore RN BSN CWON Pager [phone]

## 2015-06-15 NOTE — Progress Notes (Signed)
PT Cancellation Note  Patient Details Name: Alvy BimlerMilton Fordham Jr. MRN: [id number] DOB: 07-21-1929   Cancelled Treatment:    Reason Eval/Treat Not Completed: Medical issues which prohibited therapy (Pt's INR 5.53 (per PT guidelines, bedrest for INR >5) ).  Will re-attempt PT eval at a later date/time as medically appropriate.   Hendricks Limesmily Tannya Gonet 06/15/2015, 1:35 PM Hendricks LimesEmily Daylyn Azbill, PT [phone]

## 2015-06-15 NOTE — Progress Notes (Addendum)
Noted hemoptysis and gross hematuria. Pt alarmed by hemoptysis. Paged MD. Dr. Juliene PinaMody aware of hemoptysis, not aware of hematuria. Reviewed lab results, INR 5.53 today, increased from 5.04 yesterday.  New orders for Vitamin K 5mg  PO one time, repeat Hgb/Hct at 8pm this evening and call [facility] with results, Also, ordered Urology consult for hematuria. Requested PRN for coughing, new order for Tussionex 5mL every 12 hours for cough, Magic mouthwash ordered for mouth/tongue redness and soreness. Dr. Juliene PinaMody to f/u with pt. tomorrow.

## 2015-06-15 NOTE — Evaluation (Signed)
Clinical/Bedside Swallow Evaluation Patient Details  Name: Riley BimlerMilton Keegan Jr. MRN: [id number] Date of Birth: [birth year]  Today's Date: 06/15/2015 Time: SLP Start Time (ACUTE ONLY): 0910 SLP Stop Time (ACUTE ONLY): 1010 SLP Time Calculation (min) (ACUTE ONLY): 60 min  Past Medical History:  Past Medical History  Diagnosis Date  . Diabetes mellitus without complication (HCC)   . Hypertension   . A-fib (HCC)   . GERD (gastroesophageal reflux disease)   . CHF (congestive heart failure) (HCC)   . Fracture, femur [facility](HCC)    Past Surgical History:  Past Surgical History  Procedure Laterality Date  . Cardiac surgery      bypass in 89  . Cardiac defibrillator placement      replaced 1/16, medtronic  . Cardiac defibrillator placement      medtronic 1/16  . Hip pinning,cannulated Left 05/19/2015    Procedure: CANNULATED HIP PINNING;  Surgeon: Juanell FairlyKevin Krasinski, MD;  Location: [facility] ORS;  Service: Orthopedics;  Laterality: Left;   HPI:  Pt is a 81 y.o. male with a known history of DM, CHF, HTN, GERD, afib w/ recent admission in November 2016 for impacted left femoral neck fracture status post ORIF on 05/19/2015 by Dr. Martha ClanKrasinski presents to the hospital from skilled nursing facility rehabilitation with nausea, vomiting and coughing. Patient was doing well after surgery. He was seen by orthopedist surgeon yesterday and had his staples taken out. He did not feel well today in the morning and later in the afternoon, became nauseated and vomited. After episode of nausea and vomiting. He had choking sensation and has been coughing and was sent to emergency room for further evaluation where he was noted to be hypoxic, tachypneic, febrile to 103. His O2 sats were as low as 85%. Hospitalist services were contacted for admission for aspiration versus healthcare associated pneumonia. Patient continues to have intermittent nausea and vomiting. He choked with vomitus earlier in the emergency room. He apparently  was given some Phenergan in the facility and was brought to emergency room where he was found to be intermittently obtunded. Pt denied any trouble swallowing prior to admission, just frequent N/V. It is suspected pt choked/aspirated during vomiting by the description in the H/P. NSG denied any trouble swallowing pills w/ water or few bites of food, however, pt is not wanting to eat at meals - N/V?Marland Kitchen.   Assessment / Plan / Recommendation Clinical Impression  Pt appeared to tolerate trials of thin lipuids, purees and soft solids(broken down well d/t lacking upper dentition) w/ no overt s/s of aspiration noted; no cough or throat clear or change in vocal quality or O2 sats/RR noted. Pt exhibited no significant oral phase deficits; pt was able to clear adequately w/ trials. Rec. a fairly regular diet at this time d/t clinicial presentation - suspect pt's oropharyngeal phase of swallowing is adequate but he is at increased risk for aspiration when vomiting. ST services will be available for any further f/u if any change in status suspecting oropharyngeal phase dysphagia. Educated pt on eating foods that appeal to him during this time of N/V. Pt agreed. NSG and updated.     Aspiration Risk   (reduced)    Diet Recommendation  regular diet w/ meats cut well/moist; aspiration precautions; tray setup and positioning  Medication Administration: Whole meds with liquid (or w/ puree if easier to swallow)    Other  Recommendations Recommended Consults:  (Dietician) Oral Care Recommendations: Oral care BID;Patient independent with oral care   Follow up Recommendations   (  per PT)    Frequency and Duration            Prognosis Prognosis for Safe Diet Advancement: Good Barriers to Reach Goals:  (GI n/v)      Swallow Study   General Date of Onset: 06/13/15 HPI: Pt is a 81 y.o. male with a known history of DM, CHF, HTN, GERD, afib w/ recent admission in November 2016 for impacted left femoral neck fracture  status post ORIF on 05/19/2015 by Dr. Martha Clan presents to the hospital from skilled nursing facility rehabilitation with nausea, vomiting and coughing. Patient was doing well after surgery. He was seen by orthopedist surgeon yesterday and had his staples taken out. He did not feel well today in the morning and later in the afternoon, became nauseated and vomited. After episode of nausea and vomiting. He had choking sensation and has been coughing and was sent to emergency room for further evaluation where he was noted to be hypoxic, tachypneic, febrile to 103. His O2 sats were as low as 85%. Hospitalist services were contacted for admission for aspiration versus healthcare associated pneumonia. Patient continues to have intermittent nausea and vomiting. He choked with vomitus earlier in the emergency room. He apparently was given some Phenergan in the facility and was brought to emergency room where he was found to be intermittently obtunded. Pt denied any trouble swallowing prior to admission, just frequent N/V. It is suspected pt choked/aspirated during vomiting by the description in the H/P. NSG denied any trouble swallowing pills w/ water or few bites of food, however, pt is not wanting to eat at meals - N/V?Marland Kitchen Type of Study: Bedside Swallow Evaluation Previous Swallow Assessment: none Diet Prior to this Study: Regular;Thin liquids Temperature Spikes Noted: Yes Respiratory Status: Nasal cannula (2 liters) History of Recent Intubation: No Behavior/Cognition: Alert;Cooperative;Pleasant mood Oral Cavity Assessment: Within Functional Limits Oral Care Completed by SLP: Yes Oral Cavity - Dentition: Dentures, top (lower native dentition ) Vision: Functional for self-feeding Self-Feeding Abilities: Able to feed self;Needs set up Patient Positioning: Upright in bed Baseline Vocal Quality: Normal Volitional Cough: Strong Volitional Swallow: Able to elicit    Oral/Motor/Sensory Function Overall Oral  Motor/Sensory Function: Within functional limits   Ice Chips Ice chips: Within functional limits Presentation: Spoon (fed; 3 trials)   Thin Liquid Thin Liquid: Within functional limits Presentation: Self Fed;Straw (6 trials)    Nectar Thick Nectar Thick Liquid: Not tested   Honey Thick Honey Thick Liquid: Not tested   Puree Puree: Within functional limits Presentation: Self Fed;Spoon (6+ trials)   Solid Solid: Within functional limits Presentation:  (2 trials)      Jerilynn Som, MS, CCC-SLP  Riley Sanchez 06/15/2015,11:05 AM

## 2015-06-15 NOTE — Consult Note (Signed)
Urology Consult  I have been asked to see the patient by Dr. Juliene Pina, for evaluation and management of new onset gross hematuria.  Chief Complaint: gross hematuria  History of Present Illness: Riley Sanchez. is a 81 y.o. year old male admitted to the hospitalist service with bilateral aspiration pneumonia currently on broad-spectrum antibiotics. Urology asked to see the patient today for new onset gross hematuria in the setting of supra therapeutic INR (5.53) per his Foley catheter.  Patient was in his normal state of health until approximately 3 weeks ago when he sustained a fall and underwent ORIF for left femoral head fracture with Dr. Martha Clan.  He was discharged to a rehabilitation nursing facility but readmitted with hemoptysis, tachycardia, hypoxia and fevers.  Chest x-ray showed bibasilar infiltrates concerning for aspiration pneumonia. During this admission, he was in the ICU with a Foley catheter placed in order to monitor urine output as well as for patient immobilization issues.  Today, his catheter was draining clear urine until later in the day when blood-tinged urine was noted. He denies any catheter trauma. He has no history of gross hematuria in the past. No history of kidney stones. No history of prostate issues her baseline voiding symptoms. No history of urinary retention other than requiring Foley catheter during other hospital admissions. He denies taking any medications from his prostate, however, tamsulosin was on his admission H&P and also seen on H&P from as early as 2012.  He denies ever seeing a urologist in the past.  He is on chronic anticoagulation and has a history of a pacemaker and A. fib.  Past Medical History  Diagnosis Date  . Diabetes mellitus without complication (HCC)   . Hypertension   . A-fib (HCC)   . GERD (gastroesophageal reflux disease)   . CHF (congestive heart failure) (HCC)   . Fracture, femur [facility])     Past Surgical History    Procedure Laterality Date  . Cardiac surgery      bypass in 89  . Cardiac defibrillator placement      replaced 1/16, medtronic  . Cardiac defibrillator placement      medtronic 1/16  . Hip pinning,cannulated Left 05/19/2015    Procedure: CANNULATED HIP PINNING;  Surgeon: Juanell Fairly, MD;  Location: [facility] ORS;  Service: Orthopedics;  Laterality: Left;    Home Medications:    Medication List    ASK your doctor about these medications        acetaminophen 325 MG tablet  Commonly known as:  TYLENOL  Take 650 mg by mouth every 6 (six) hours as needed.     aspirin EC 81 MG tablet  Take 81 mg by mouth daily.     bisacodyl 10 MG suppository  Commonly known as:  DULCOLAX  Place 10 mg rectally daily as needed for moderate constipation.     cholecalciferol 1000 UNITS tablet  Commonly known as:  VITAMIN D  Take 1,000 Units by mouth daily.     diphenhydrAMINE 25 MG tablet  Commonly known as:  BENADRYL  Take 25 mg by mouth at bedtime as needed for sleep.     ferrous sulfate 325 (65 FE) MG tablet  Take 325 mg by mouth daily with breakfast.     gabapentin 100 MG capsule  Commonly known as:  NEURONTIN  Take 100 mg by mouth at bedtime.     lisinopril 10 MG tablet  Commonly known as:  PRINIVIL,ZESTRIL  Take 10 mg by mouth daily.  lovastatin 40 MG tablet  Commonly known as:  MEVACOR  Take 40 mg by mouth at bedtime.     metFORMIN 1000 MG tablet  Commonly known as:  GLUCOPHAGE  Take 1,000 mg by mouth 2 (two) times daily with a meal.     nitroGLYCERIN 0.4 MG SL tablet  Commonly known as:  NITROSTAT  Place 0.4 mg under the tongue every 5 (five) minutes as needed for chest pain.     omeprazole 20 MG capsule  Commonly known as:  PRILOSEC  Take 20 mg by mouth daily.     oxyCODONE 5 MG immediate release tablet  Commonly known as:  Oxy IR/ROXICODONE  Take 5-10 mg by mouth every 4 (four) hours as needed for severe pain.     polyethylene glycol packet  Commonly known as:   MIRALAX / GLYCOLAX  Take 17 g by mouth daily as needed.     promethazine 25 MG/ML injection  Commonly known as:  PHENERGAN  Inject 25 mg into the vein once.     tamsulosin 0.4 MG Caps capsule  Commonly known as:  FLOMAX  Take 0.4 mg by mouth 2 (two) times daily.     warfarin 5 MG tablet  Commonly known as:  COUMADIN  Take 5 mg by mouth daily.        Allergies: No Known Allergies  History reviewed. No pertinent family history.  Social History:  reports that he has quit smoking. He does not have any smokeless tobacco history on file. He reports that he does not drink alcohol or use illicit drugs.  ROS: A complete review of systems was performed.  All systems are negative except for pertinent findings as noted.  Physical Exam:  Vital signs in last 24 hours: Temp:  [98.2 F (36.8 C)-100.6 F (38.1 C)] 98.2 F (36.8 C) (12/12 1427) Pulse Rate:  [42-169] 42 (12/12 1427) Resp:  [18-30] 18 (12/12 1427) BP: (103-139)/(39-71) 110/43 mmHg (12/12 1427) SpO2:  [90 %-97 %] 97 % (12/12 1427) Constitutional:  Alert and oriented, No acute distress. HEENT: [place] AT, moist mucus membranes.  Trachea midline, no masses Cardiovascular: Regular rate and rhythm, no clubbing, cyanosis, or edema. Respiratory: Normal respiratory effort, no increased work of breathing. Wearing 02.   GI: Abdomen is soft, nontender, nondistended, no abdominal masses GU: No CVA tenderness.  Normal scrotum, testicles bilateral descended, no masses. Foley catheter in place draining light pink tinged urine, no clots or debris. Skin: No rashes, bruises or suspicious lesions Lymph: No cervical or inguinal adenopathy Neurologic: Grossly intact, no focal deficits, moving all 4 extremities MSK: Right lower extremity in boot.   Psychiatric: Normal mood and affect   Laboratory Data:   Recent Labs  06/13/15 1948 06/14/15 0435 06/15/15 0507  WBC 14.4* 12.8* 10.6  HGB 12.3* 11.0* 9.8*  HCT 37.1* 32.8* 29.7*    Recent  Labs  06/13/15 1948 06/14/15 0435 06/15/15 0507  NA 136 137 139  K 5.1 5.0 3.8  CL 105 109 110  CO2 23 23 23   GLUCOSE 216* 161* 120*  BUN 19 20 18   CREATININE 1.09 0.89 0.89  CALCIUM 8.6* 7.9* 8.0*    Recent Labs  06/12/15 2200 06/14/15 1140 06/15/15 0507  INR 3.53 5.04* 5.53*   No results for input(s): LABURIN in the last 72 hours. Results for orders placed or performed during the hospital encounter of 06/13/15  Blood Culture (routine x 2)     Status: None (Preliminary result)   Collection Time: 06/13/15  7:40 PM  Result Value Ref Range Status   Specimen Description BLOOD LEFT ASSIST CONTROL  Final   Special Requests BOTTLES DRAWN AEROBIC AND ANAEROBIC 4CC  Final   Culture  Setup Time   Final    GRAM POSITIVE COCCI ANAEROBIC BOTTLE ONLY CRITICAL RESULT CALLED TO, READ BACK BY AND VERIFIED WITH: CHERYL SMITH,RN 06/14/2015 1405 BY JRS.    Culture   Final    GRAM POSITIVE COCCI ANAEROBIC BOTTLE ONLY IDENTIFICATION TO FOLLOW    Report Status PENDING  Incomplete  Blood Culture (routine x 2)     Status: None (Preliminary result)   Collection Time: 06/13/15  7:40 PM  Result Value Ref Range Status   Specimen Description BLOOD LEFT HAND  Final   Special Requests BOTTLES DRAWN AEROBIC AND ANAEROBIC 4CC  Final   Culture NO GROWTH 2 DAYS  Final   Report Status PENDING  Incomplete  Urine culture     Status: None   Collection Time: 06/13/15  7:41 PM  Result Value Ref Range Status   Specimen Description URINE, RANDOM  Final   Special Requests NONE  Final   Culture NO GROWTH 2 DAYS  Final   Report Status 06/15/2015 FINAL  Final  MRSA PCR Screening     Status: None   Collection Time: 06/13/15 11:48 PM  Result Value Ref Range Status   MRSA by PCR NEGATIVE NEGATIVE Final    Comment:        The GeneXpert MRSA Assay (FDA approved for NASAL specimens only), is one component of a comprehensive MRSA colonization surveillance program. It is not intended to diagnose  MRSA infection nor to guide or monitor treatment for MRSA infections.   Culture, blood (routine x 2)     Status: None (Preliminary result)   Collection Time: 06/15/15 12:10 PM  Result Value Ref Range Status   Specimen Description BLOOD RIGHT ASSIST CONTROL  Final   Special Requests BOTTLES DRAWN AEROBIC AND ANAEROBIC  3CC  Final   Culture NO GROWTH <12 HOURS  Final   Report Status PENDING  Incomplete  Culture, blood (routine x 2)     Status: None (Preliminary result)   Collection Time: 06/15/15 12:10 PM  Result Value Ref Range Status   Specimen Description BLOOD RIGHT HAND  Final   Special Requests BOTTLES DRAWN AEROBIC AND ANAEROBIC  5CC  Final   Culture NO GROWTH <12 HOURS  Final   Report Status PENDING  Incomplete     Radiologic Imaging: No recent pertinent GU imaging for review.  Impression/Assessment: 81 year old male with indwelling Foley catheter, supratherapeutic INR with new onset mild gross hematuria. Possible etiology of hematuria may include Foley trauma, urinary tract infection, underlying renal/ ureteral/ bladder tumor, BPH, or kidney stone or combination of the previous. This is certainly exacerbated by supratherapeutic INR. Hematuria is very mild and does not require any intervention at this time.   Plan:  -UA/urine culture sent to rule out infection -Currently on broad-spectrum antibiotics, just as needed -Adjust Coumadin as needed to achieve desirable INR -Continue Flomax as this seems to be one of his home medications for BPH -Voiding trial per the primary team, would wait until the patient is more mobile and improving clinically --Recommend outpatient follow-up for recheck of UA once patient discharged without Foley, if microscopic/ gorss hematuria persists, will consider outpatient further workup  06/15/2015, 6:59 PM  Vanna Scotland,  MD   Thank you for involving me in this patient's care.  I will sign  off on this patient unless needed.  Please page with  any further questions or concerns or if hematuria worsens.

## 2015-06-15 NOTE — Progress Notes (Signed)
Prime Dr. Was called about patient Hemoglobin. Another CBC scheduled for in the morning, hemoglobin is 8.8 this evening.

## 2015-06-16 ENCOUNTER — Inpatient Hospital Stay: Payer: Medicare Other

## 2015-06-16 ENCOUNTER — Inpatient Hospital Stay
Admit: 2015-06-16 | Discharge: 2015-06-16 | Disposition: A | Discharge status: Home / Self Care | Payer: Medicare Other | Attending: Internal Medicine | Admitting: Internal Medicine

## 2015-06-16 DIAGNOSIS — J690 Pneumonitis due to inhalation of food and vomit: Secondary | ICD-10-CM

## 2015-06-16 DIAGNOSIS — R042 Hemoptysis: Secondary | ICD-10-CM

## 2015-06-16 DIAGNOSIS — L8990 Pressure ulcer of unspecified site, unspecified stage: Secondary | ICD-10-CM | POA: Insufficient documentation

## 2015-06-16 LAB — BASIC METABOLIC PANEL
Anion gap: 7 (ref 5–15)
BUN: 13 mg/dL (ref 6–20)
CALCIUM: 8 mg/dL — AB (ref 8.9–10.3)
CO2: 25 mmol/L (ref 22–32)
Chloride: 109 mmol/L (ref 101–111)
Creatinine, Ser: 0.8 mg/dL (ref 0.61–1.24)
GFR calc Af Amer: >60 mL/min (ref >60)
GLUCOSE: 113 mg/dL — AB (ref 65–99)
POTASSIUM: 3.2 mmol/L — AB (ref 3.5–5.1)
SODIUM: 141 mmol/L (ref 135–145)

## 2015-06-16 LAB — CBC
HCT: 27.4 % — ABNORMAL LOW (ref 40.0–52.0)
Hemoglobin: 9.3 g/dL — ABNORMAL LOW (ref 13.0–18.0)
MCH: 29.9 pg (ref 26.0–34.0)
MCHC: 33.8 g/dL (ref 32.0–36.0)
MCV: 88.6 fL (ref 80.0–100.0)
PLATELETS: 192 10*3/uL (ref 150–440)
RBC: 3.1 MIL/uL — ABNORMAL LOW (ref 4.40–5.90)
RDW: 13.7 % (ref 11.5–14.5)
WBC: 9.3 10*3/uL (ref 3.8–10.6)

## 2015-06-16 LAB — EXPECTORATED SPUTUM ASSESSMENT W GRAM STAIN, RFLX TO RESP C: Special Requests: NORMAL

## 2015-06-16 LAB — GLUCOSE, CAPILLARY
GLUCOSE-CAPILLARY: 112 mg/dL — AB (ref 65–99)
GLUCOSE-CAPILLARY: 147 mg/dL — AB (ref 65–99)
GLUCOSE-CAPILLARY: 162 mg/dL — AB (ref 65–99)
GLUCOSE-CAPILLARY: 192 mg/dL — AB (ref 65–99)
Glucose-Capillary: 101 mg/dL — ABNORMAL HIGH (ref 65–99)
Glucose-Capillary: 163 mg/dL — ABNORMAL HIGH (ref 65–99)

## 2015-06-16 LAB — EXPECTORATED SPUTUM ASSESSMENT W REFEX TO RESP CULTURE

## 2015-06-16 LAB — MAGNESIUM: Magnesium: 1.4 mg/dL — ABNORMAL LOW (ref 1.7–2.4)

## 2015-06-16 LAB — PROTIME-INR
INR: 2.84
Prothrombin Time: 29.4 seconds — ABNORMAL HIGH (ref 11.4–15.0)

## 2015-06-16 MED ORDER — WARFARIN SODIUM 5 MG PO TABS
2.5000 mg | ORAL_TABLET | Freq: Once | ORAL | Status: AC
  Administered 2015-06-16: 2.5 mg via ORAL
  Filled 2015-06-16: qty 1

## 2015-06-16 MED ORDER — TIOTROPIUM BROMIDE MONOHYDRATE 18 MCG IN CAPS
18.0000 ug | ORAL_CAPSULE | Freq: Every day | RESPIRATORY_TRACT | Status: DC
  Administered 2015-06-16 – 2015-06-19 (×4): 18 ug via RESPIRATORY_TRACT
  Filled 2015-06-16: qty 5

## 2015-06-16 MED ORDER — SODIUM CHLORIDE 0.9 % IV SOLN
3.0000 g | Freq: Four times a day (QID) | INTRAVENOUS | Status: DC
  Administered 2015-06-16 – 2015-06-19 (×12): 3 g via INTRAVENOUS
  Filled 2015-06-16 (×16): qty 3

## 2015-06-16 MED ORDER — WARFARIN SODIUM 5 MG PO TABS: 2.5000 mg | ORAL_TABLET | Freq: Every day | ORAL | Status: DC

## 2015-06-16 MED ORDER — MOMETASONE FURO-FORMOTEROL FUM 200-5 MCG/ACT IN AERO
2.0000 | INHALATION_SPRAY | Freq: Two times a day (BID) | RESPIRATORY_TRACT | Status: DC
  Administered 2015-06-16 – 2015-06-19 (×5): 2 via RESPIRATORY_TRACT
  Filled 2015-06-16 (×2): qty 8.8

## 2015-06-16 MED ORDER — FUROSEMIDE 10 MG/ML IJ SOLN
20.0000 mg | Freq: Once | INTRAMUSCULAR | Status: AC
Start: 2015-06-16 — End: 2015-06-16
  Administered 2015-06-16: 20 mg via INTRAVENOUS
  Filled 2015-06-16: qty 2

## 2015-06-16 MED ORDER — ENSURE ENLIVE PO LIQD
237.0000 mL | Freq: Three times a day (TID) | ORAL | Status: DC
  Administered 2015-06-16 – 2015-06-19 (×9): 237 mL via ORAL

## 2015-06-16 MED ORDER — FUROSEMIDE 10 MG/ML IJ SOLN
20.0000 mg | Freq: Every day | INTRAMUSCULAR | Status: DC
  Administered 2015-06-16 – 2015-06-17 (×2): 20 mg via INTRAVENOUS
  Filled 2015-06-16 (×2): qty 2

## 2015-06-16 MED ORDER — BENZONATATE 100 MG PO CAPS
200.0000 mg | ORAL_CAPSULE | Freq: Three times a day (TID) | ORAL | Status: DC | PRN
  Administered 2015-06-16 – 2015-06-18 (×4): 200 mg via ORAL
  Filled 2015-06-16 (×4): qty 2

## 2015-06-16 NOTE — Progress Notes (Signed)
Dr. Juliene PinaMody notified of VRE in blood cultures. Continue with isolation. Blood sugar 112 this morning, patient has no appetite; stated to hold novolog. Patient coughed up bright blood this am.

## 2015-06-16 NOTE — Consult Note (Addendum)
[facility] Infectious Disease     Reason for Consult: VRE bacteremia    Referring Physician: Bettey Costa Date of Admission:  06/13/2015   Active Problems:   Sepsis ([place])   Gross hematuria   Pressure ulcer   HPI: Riley Sanchez. is a 81 y.o. male admitted 12/10 with AMS, fever, n,v cough and tachycardia.  He underwent ORIF 11/15 and was at [facility] for rehab.  He developed n.v and then a choking sensation.  In ED temp 1-3, sats low.  CXR with bibasilar infitlrates. KUB neg.  Since admission he has been on IV abx and he has had bcx 1/2 + fro enterococcus gallinarum. Fu bcx 1/12 negative. He has defervesce and wbc down to 9.3.  He was in the ICU intially but transferred to floor.  Still with cough but denies abd pain. No further nv.   Past Medical History  Diagnosis Date  . Diabetes mellitus without complication ([place])   . Hypertension   . A-fib ([place])   . GERD (gastroesophageal reflux disease)   . CHF (congestive heart failure) ([place])   . Fracture, femur [facility])    Past Surgical History  Procedure Laterality Date  . Cardiac surgery      bypass in 89  . Cardiac defibrillator placement      replaced 1/16, medtronic  . Cardiac defibrillator placement      medtronic 1/16  . Hip pinning,cannulated Left 05/19/2015    Procedure: CANNULATED HIP PINNING;  Surgeon: Thornton Park, MD;  Location: [facility] ORS;  Service: Orthopedics;  Laterality: Left;   Social History  Substance Use Topics  . Smoking status: Former Research scientist (life sciences)  . Smokeless tobacco: None  . Alcohol Use: No   History reviewed. No pertinent family history.  Allergies: No Known Allergies  Current antibiotics: Antibiotics Given (last 72 hours)    Date/Time Action Medication Dose Rate   06/14/15 0343 Given   piperacillin-tazobactam (ZOSYN) IVPB 3.375 g 3.375 g 12.5 mL/hr   06/14/15 0541 Given   vancomycin (VANCOCIN) IVPB 750 mg/150 ml premix 750 mg 150 mL/hr   06/14/15 1134 Given   piperacillin-tazobactam (ZOSYN) IVPB 3.375  g 3.375 g 12.5 mL/hr   06/14/15 2046 Given   piperacillin-tazobactam (ZOSYN) IVPB 3.375 g 3.375 g 12.5 mL/hr   06/14/15 2357 Given   vancomycin (VANCOCIN) IVPB 750 mg/150 ml premix 750 mg 150 mL/hr   06/15/15 0348 Given   piperacillin-tazobactam (ZOSYN) IVPB 3.375 g 3.375 g 12.5 mL/hr   06/15/15 1147 Given   piperacillin-tazobactam (ZOSYN) IVPB 3.375 g 3.375 g 12.5 mL/hr   06/15/15 1841 Given   vancomycin (VANCOCIN) IVPB 750 mg/150 ml premix 750 mg 150 mL/hr   06/15/15 2036 Given   piperacillin-tazobactam (ZOSYN) IVPB 3.375 g 3.375 g 12.5 mL/hr   06/16/15 0251 Given   piperacillin-tazobactam (ZOSYN) IVPB 3.375 g 3.375 g 12.5 mL/hr   06/16/15 1209 Given   piperacillin-tazobactam (ZOSYN) IVPB 3.375 g 3.375 g 12.5 mL/hr      MEDICATIONS: . ferrous sulfate  325 mg Oral Q breakfast  . furosemide  20 mg Intravenous Daily  . gabapentin  100 mg Oral QHS  . Influenza vac split quadrivalent PF  0.5 mL Intramuscular Tomorrow-1000  . insulin aspart  0-9 Units Subcutaneous TID WC  . magic mouthwash  10 mL Oral QID  . pantoprazole  40 mg Oral Daily  . piperacillin-tazobactam (ZOSYN)  IV  3.375 g Intravenous 3 times per day  . pravastatin  10 mg Oral q1800  . promethazine  12.5 mg Intravenous Once  . sodium chloride  3 mL Intravenous Q12H  . tamsulosin  0.4 mg Oral BID  . warfarin  2.5 mg Oral ONCE-1800  . Warfarin - Pharmacist Dosing Inpatient   Does not apply q1800    Review of Systems - 11 systems reviewed and negative per HPI   OBJECTIVE: Temp:  [98.4 F (36.9 C)-99.6 F (37.6 C)] 98.5 F (36.9 C) (12/13 1538) Pulse Rate:  [63-98] 63 (12/13 1538) Resp:  [17-20] 20 (12/13 1538) BP: (130-146)/(50-55) 146/55 mmHg (12/13 1538) SpO2:  [86 %-99 %] 96 % (12/13 1538) Physical Exam  Constitutional: He is oriented to person, place, and time. Thin, frail, coughing a lot HENT: perrla anicteric Mouth/Throat: Oropharynx is clear and moist. No oropharyngeal exudate.  Cardiovascular: Normal  rate, regular rhythm and normal heart sounds.  Pulmonary/Chest:bilat rhonchi Abdominal: Soft. Bowel sounds are normal. He exhibits no distension. There is no tenderness.  Lymphadenopathy:  He has no cervical adenopathy.  Neurological: He is alert and oriented to person, place, and time.  Skin: Skin is warm and dry. No rash noted. No erythema.  Psychiatric: He has a normal mood and affect. His behavior is normal.   LABS: Results for orders placed or performed during the hospital encounter of 06/13/15 (from the past 48 hour(s))  Glucose, capillary     Status: None   Collection Time: 06/14/15  4:49 PM  Result Value Ref Range   Glucose-Capillary 94 65 - 99 mg/dL  Glucose, capillary     Status: Abnormal   Collection Time: 06/14/15 10:13 PM  Result Value Ref Range   Glucose-Capillary 108 (H) 65 - 99 mg/dL  Protime-INR     Status: Abnormal   Collection Time: 06/15/15  5:07 AM  Result Value Ref Range   Prothrombin Time 48.5 (H) 11.4 - 15.0 seconds   INR 5.53 (HH)     Comment: RESULT REPEATED AND VERIFIED CRITICAL RESULT CALLED TO, READ BACK BY AND VERIFIED WITH: [place] AT [phone] ON 06/15/15.Marland KitchenMarland KitchenMinnie Hamilton Health Care Center   CBC     Status: Abnormal   Collection Time: 06/15/15  5:07 AM  Result Value Ref Range   WBC 10.6 3.8 - 10.6 K/uL   RBC 3.31 (L) 4.40 - 5.90 MIL/uL   Hemoglobin 9.8 (L) 13.0 - 18.0 g/dL   HCT 29.7 (L) 40.0 - 52.0 %   MCV 89.7 80.0 - 100.0 fL   MCH 29.6 26.0 - 34.0 pg   MCHC 33.0 32.0 - 36.0 g/dL   RDW 14.2 11.5 - 14.5 %   Platelets 173 150 - 440 K/uL  Basic metabolic panel     Status: Abnormal   Collection Time: 06/15/15  5:07 AM  Result Value Ref Range   Sodium 139 135 - 145 mmol/L   Potassium 3.8 3.5 - 5.1 mmol/L   Chloride 110 101 - 111 mmol/L   CO2 23 22 - 32 mmol/L   Glucose, Bld 120 (H) 65 - 99 mg/dL   BUN 18 6 - 20 mg/dL   Creatinine, Ser 0.89 0.61 - 1.24 mg/dL   Calcium 8.0 (L) 8.9 - 10.3 mg/dL   GFR calc non Af Amer >60 >60 mL/min   GFR calc Af Amer >60 >60 mL/min     Comment: (NOTE) The eGFR has been calculated using the CKD EPI equation. This calculation has not been validated in all clinical situations. eGFR's persistently <60 mL/min signify possible Chronic Kidney Disease.    Anion gap 6 5 - 15  Glucose, capillary  Status: Abnormal   Collection Time: 06/15/15  7:41 AM  Result Value Ref Range   Glucose-Capillary 112 (H) 65 - 99 mg/dL  Glucose, capillary     Status: Abnormal   Collection Time: 06/15/15 11:25 AM  Result Value Ref Range   Glucose-Capillary 127 (H) 65 - 99 mg/dL  Culture, blood (routine x 2)     Status: None (Preliminary result)   Collection Time: 06/15/15 12:10 PM  Result Value Ref Range   Specimen Description BLOOD RIGHT ASSIST CONTROL    Special Requests BOTTLES DRAWN AEROBIC AND ANAEROBIC  3CC    Culture NO GROWTH < 24 HOURS    Report Status PENDING   Culture, blood (routine x 2)     Status: None (Preliminary result)   Collection Time: 06/15/15 12:10 PM  Result Value Ref Range   Specimen Description BLOOD RIGHT HAND    Special Requests BOTTLES DRAWN AEROBIC AND ANAEROBIC  5CC    Culture NO GROWTH < 24 HOURS    Report Status PENDING   Glucose, capillary     Status: Abnormal   Collection Time: 06/15/15  4:27 PM  Result Value Ref Range   Glucose-Capillary 115 (H) 65 - 99 mg/dL  Urine culture     Status: None (Preliminary result)   Collection Time: 06/15/15  7:00 PM  Result Value Ref Range   Specimen Description URINE, CATHETERIZED    Special Requests NONE    Culture NO GROWTH < 24 HOURS    Report Status PENDING   Urinalysis complete, with microscopic ([facility] only)     Status: Abnormal   Collection Time: 06/15/15  7:00 PM  Result Value Ref Range   Color, Urine YELLOW (A) YELLOW   APPearance CLOUDY (A) CLEAR   Glucose, UA NEGATIVE NEGATIVE mg/dL   Bilirubin Urine NEGATIVE NEGATIVE   Ketones, ur NEGATIVE NEGATIVE mg/dL   Specific Gravity, Urine 1.029 1.005 - 1.030   Hgb urine dipstick 3+ (A) NEGATIVE   pH 5.0 5.0 -  8.0   Protein, ur 30 (A) NEGATIVE mg/dL   Nitrite NEGATIVE NEGATIVE   Leukocytes, UA NEGATIVE NEGATIVE   RBC / HPF TOO NUMEROUS TO COUNT 0 - 5 RBC/hpf   WBC, UA 6-30 0 - 5 WBC/hpf   Bacteria, UA NONE SEEN NONE SEEN   Squamous Epithelial / LPF 0-5 (A) NONE SEEN   Mucous PRESENT    Uric Acid Crys, UA PRESENT   C difficile quick scan w PCR reflex     Status: None   Collection Time: 06/15/15  7:00 PM  Result Value Ref Range   C Diff antigen NEGATIVE NEGATIVE   C Diff toxin NEGATIVE NEGATIVE   C Diff interpretation Negative for C. difficile   Hemoglobin and hematocrit, blood     Status: Abnormal   Collection Time: 06/15/15  7:22 PM  Result Value Ref Range   Hemoglobin 8.8 (L) 13.0 - 18.0 g/dL   HCT 26.0 (L) 40.0 - 52.0 %  Glucose, capillary     Status: Abnormal   Collection Time: 06/15/15  9:15 PM  Result Value Ref Range   Glucose-Capillary 116 (H) 65 - 99 mg/dL  Glucose, capillary     Status: Abnormal   Collection Time: 06/16/15  4:34 AM  Result Value Ref Range   Glucose-Capillary 101 (H) 65 - 99 mg/dL  Protime-INR     Status: Abnormal   Collection Time: 06/16/15  6:35 AM  Result Value Ref Range   Prothrombin Time 29.4 (H) 11.4 -  15.0 seconds   INR 2.84   CBC     Status: Abnormal   Collection Time: 06/16/15  6:35 AM  Result Value Ref Range   WBC 9.3 3.8 - 10.6 K/uL   RBC 3.10 (L) 4.40 - 5.90 MIL/uL   Hemoglobin 9.3 (L) 13.0 - 18.0 g/dL   HCT 27.4 (L) 40.0 - 52.0 %   MCV 88.6 80.0 - 100.0 fL   MCH 29.9 26.0 - 34.0 pg   MCHC 33.8 32.0 - 36.0 g/dL   RDW 13.7 11.5 - 14.5 %   Platelets 192 150 - 440 K/uL  Basic metabolic panel     Status: Abnormal   Collection Time: 06/16/15  6:35 AM  Result Value Ref Range   Sodium 141 135 - 145 mmol/L   Potassium 3.2 (L) 3.5 - 5.1 mmol/L   Chloride 109 101 - 111 mmol/L   CO2 25 22 - 32 mmol/L   Glucose, Bld 113 (H) 65 - 99 mg/dL   BUN 13 6 - 20 mg/dL   Creatinine, Ser 0.80 0.61 - 1.24 mg/dL   Calcium 8.0 (L) 8.9 - 10.3 mg/dL   GFR  calc non Af Amer >60 >60 mL/min   GFR calc Af Amer >60 >60 mL/min    Comment: (NOTE) The eGFR has been calculated using the CKD EPI equation. This calculation has not been validated in all clinical situations. eGFR's persistently <60 mL/min signify possible Chronic Kidney Disease.    Anion gap 7 5 - 15  Magnesium     Status: Abnormal   Collection Time: 06/16/15  6:35 AM  Result Value Ref Range   Magnesium 1.4 (L) 1.7 - 2.4 mg/dL  Glucose, capillary     Status: Abnormal   Collection Time: 06/16/15  7:31 AM  Result Value Ref Range   Glucose-Capillary 112 (H) 65 - 99 mg/dL  Culture, expectorated sputum-assessment     Status: None   Collection Time: 06/16/15 10:15 AM  Result Value Ref Range   Specimen Description SPUTUM    Special Requests Normal    Sputum evaluation THIS SPECIMEN IS ACCEPTABLE FOR SPUTUM CULTURE    Report Status 06/16/2015 FINAL   Glucose, capillary     Status: Abnormal   Collection Time: 06/16/15 11:49 AM  Result Value Ref Range   Glucose-Capillary 163 (H) 65 - 99 mg/dL   No components found for: ESR, C REACTIVE PROTEIN MICRO: Recent Results (from the past 720 hour(s))  Urine culture     Status: None   Collection Time: 05/18/15  5:50 PM  Result Value Ref Range Status   Specimen Description URINE, CLEAN CATCH  Final   Special Requests NONE  Final   Culture NO GROWTH 2 DAYS  Final   Report Status 05/20/2015 FINAL  Final  MRSA PCR Screening     Status: None   Collection Time: 05/18/15  5:51 PM  Result Value Ref Range Status   MRSA by PCR NEGATIVE NEGATIVE Final    Comment:        The GeneXpert MRSA Assay (FDA approved for NASAL specimens only), is one component of a comprehensive MRSA colonization surveillance program. It is not intended to diagnose MRSA infection nor to guide or monitor treatment for MRSA infections.   Blood Culture (routine x 2)     Status: None (Preliminary result)   Collection Time: 06/13/15  7:40 PM  Result Value Ref Range  Status   Specimen Description BLOOD LEFT ASSIST CONTROL  Final   Special Requests  BOTTLES DRAWN AEROBIC AND ANAEROBIC 4CC  Final   Culture  Setup Time   Final    GRAM POSITIVE COCCI ANAEROBIC BOTTLE ONLY CRITICAL RESULT CALLED TO, READ BACK BY AND VERIFIED WITH: CHERYL SMITH,RN 06/14/2015 1405 BY JRS.    Culture   Final    ENTEROCOCCUS GALLINARUM ANAEROBIC BOTTLE ONLY Results Called to: APRIL RAST AT 8453 06/16/15 DV    Report Status PENDING  Incomplete   Organism ID, Bacteria ENTEROCOCCUS GALLINARUM  Final      Susceptibility   Enterococcus gallinarum - MIC*    AMPICILLIN <=2 SENSITIVE Sensitive     LINEZOLID Value in next row Sensitive      SENSITIVE2    VANCOMYCIN Value in next row Resistant      RESISTANT<=0.5    GENTAMICIN SYNERGY Value in next row Sensitive      RESISTANT<=0.5    * ENTEROCOCCUS GALLINARUM  Blood Culture (routine x 2)     Status: None (Preliminary result)   Collection Time: 06/13/15  7:40 PM  Result Value Ref Range Status   Specimen Description BLOOD LEFT HAND  Final   Special Requests BOTTLES DRAWN AEROBIC AND ANAEROBIC 4CC  Final   Culture NO GROWTH 3 DAYS  Final   Report Status PENDING  Incomplete  Urine culture     Status: None   Collection Time: 06/13/15  7:41 PM  Result Value Ref Range Status   Specimen Description URINE, RANDOM  Final   Special Requests NONE  Final   Culture NO GROWTH 2 DAYS  Final   Report Status 06/15/2015 FINAL  Final  MRSA PCR Screening     Status: None   Collection Time: 06/13/15 11:48 PM  Result Value Ref Range Status   MRSA by PCR NEGATIVE NEGATIVE Final    Comment:        The GeneXpert MRSA Assay (FDA approved for NASAL specimens only), is one component of a comprehensive MRSA colonization surveillance program. It is not intended to diagnose MRSA infection nor to guide or monitor treatment for MRSA infections.   Culture, blood (routine x 2)     Status: None (Preliminary result)   Collection Time: 06/15/15  12:10 PM  Result Value Ref Range Status   Specimen Description BLOOD RIGHT ASSIST CONTROL  Final   Special Requests BOTTLES DRAWN AEROBIC AND ANAEROBIC  3CC  Final   Culture NO GROWTH < 24 HOURS  Final   Report Status PENDING  Incomplete  Culture, blood (routine x 2)     Status: None (Preliminary result)   Collection Time: 06/15/15 12:10 PM  Result Value Ref Range Status   Specimen Description BLOOD RIGHT HAND  Final   Special Requests BOTTLES DRAWN AEROBIC AND ANAEROBIC  5CC  Final   Culture NO GROWTH < 24 HOURS  Final   Report Status PENDING  Incomplete  Urine culture     Status: None (Preliminary result)   Collection Time: 06/15/15  7:00 PM  Result Value Ref Range Status   Specimen Description URINE, CATHETERIZED  Final   Special Requests NONE  Final   Culture NO GROWTH < 24 HOURS  Final   Report Status PENDING  Incomplete  C difficile quick scan w PCR reflex     Status: None   Collection Time: 06/15/15  7:00 PM  Result Value Ref Range Status   C Diff antigen NEGATIVE NEGATIVE Final   C Diff toxin NEGATIVE NEGATIVE Final   C Diff interpretation Negative for C. difficile  Final  Culture, expectorated sputum-assessment     Status: None   Collection Time: 06/16/15 10:15 AM  Result Value Ref Range Status   Specimen Description SPUTUM  Final   Special Requests Normal  Final   Sputum evaluation THIS SPECIMEN IS ACCEPTABLE FOR SPUTUM CULTURE  Final   Report Status 06/16/2015 FINAL  Final    IMAGING: Dg Chest 1 View  06/16/2015  CLINICAL DATA:  Acute onset of shortness of breath and productive cough. Recent internal fixation of left femoral fracture. Initial encounter. EXAM: CHEST 1 VIEW COMPARISON:  Chest radiograph performed 06/15/2015 FINDINGS: Left basilar airspace opacification raises concern for pneumonia. Mild vascular congestion is noted. Mild right basilar airspace opacity is also seen. No definite pleural effusion or pneumothorax identified. The cardiomediastinal silhouette  is mildly enlarged. The patient is status post median sternotomy, with evidence of prior CABG. A pacemaker/AICD is noted overlying the left chest wall, with leads ending overlying the right atrium and right ventricle. No acute osseous abnormalities are identified. IMPRESSION: 1. Left basilar airspace opacification raises concern for pneumonia. Mild right basilar airspace opacity also noted. This is similar in appearance to the prior study. 2. Mild vascular congestion and mild cardiomegaly noted. Electronically Signed   By: Garald Balding M.D.   On: 06/16/2015 06:59   Dg Chest 1 View  06/15/2015  CLINICAL DATA:  Pneumonia, sepsis ; history of CHF and recent surgery for left hip fracture EXAM: CHEST 1 VIEW COMPARISON:  Portable chest x-ray of June 13, 2015 FINDINGS: The lungs are adequately inflated. Bibasilar pneumonia persists. There is no significant pleural effusion. The cardiac silhouette is mildly enlarged. The pulmonary vascularity is not engorged. A permanent pacemaker defibrillator is in reasonable position. The patient has undergone previous CABG. The observed bony thorax exhibits no acute abnormality. IMPRESSION: Persistent bibasilar infiltrates. No significant pleural effusion is evident. Stable cardiomegaly without significant pulmonary vascular congestion. Electronically Signed   By: Ransom Nickson  [place] M.D.   On: 06/15/2015 07:48   Dg Chest 1 View  05/18/2015  CLINICAL DATA:  [place] today at 0900 hours from a standing position. Fall occurred while doing balancing exercises. EXAM: CHEST 1 VIEW COMPARISON:  10/29/2014 FINDINGS: Previous median sternotomy and CABG. The heart remains mildly enlarged. Atherosclerosis of the aorta again seen. Pacemaker/ AICD inserted from the left appears unchanged. The pulmonary vascularity is normal. The lungs are clear. No effusions. IMPRESSION: No change.  No active disease.  Previous CABG and pacemaker/ AICD. Electronically Signed   By: Nelson Chimes M.D.   On:  05/18/2015 15:50   Ct Head Wo Contrast  06/13/2015  CLINICAL DATA:  Acute encephalopathy.  Nausea and vomiting. EXAM: CT HEAD WITHOUT CONTRAST TECHNIQUE: Contiguous axial images were obtained from the base of the skull through the vertex without intravenous contrast. COMPARISON:  Head CT 05/18/2015 FINDINGS: No intracranial hemorrhage, mass effect, or midline shift. Stable atrophy and chronic small vessel ischemia from prior exam. Remote lacunar infarcts in the bilateral basal ganglia and right cerebellum, unchanged. No hydrocephalus. The basilar cisterns are patent. No evidence of territorial infarct. No intracranial fluid collection. Atherosclerosis of skullbase vasculature. Calvarium is intact. Chronic opacification of right side of sphenoid sinus with small fluid level and mucosal thickening in the left side of sphenoid sinus and throughout the ethmoid air cells, with minimal improvement from prior. Mastoid air cells are well aerated. IMPRESSION: 1. Stable atrophy and chronic small vessel ischemia without acute intracranial abnormality. 2. Chronic sinusitis, with mild improvement from prior. Electronically Signed  By: Jeb Levering M.D.   On: 06/13/2015 22:00   Ct Head Wo Contrast  05/18/2015  CLINICAL DATA:  Fall. EXAM: CT HEAD WITHOUT CONTRAST TECHNIQUE: Contiguous axial images were obtained from the base of the skull through the vertex without intravenous contrast. COMPARISON:  06/22/2011 FINDINGS: Chronic bilateral basal ganglia lacunar infarcts noted. There is mild low attenuation throughout the subcortical and periventricular white matter compatible with chronic microvascular disease. Prominence of the sulci and ventricles noted. No abnormal extra-axial fluid collections, intracranial hemorrhage or mass identified. No evidence for acute infarct. Partial opacification of the ethmoid air cells and sphenoid sinus noted. The mastoid air cells appear clear. The calvarium is intact. IMPRESSION: 1. No  acute intracranial abnormalities. 2. Chronic microvascular disease and brain atrophy. 3. Ethmoid air cell and sphenoid sinus opacification. Electronically Signed   By: Kerby Moors M.D.   On: 05/18/2015 17:02   Ct Hip Left Wo Contrast  05/18/2015  CLINICAL DATA:  Status post fall onto left hip, with left hip pain. Initial encounter. EXAM: CT OF THE LEFT HIP WITHOUT CONTRAST TECHNIQUE: Multidetector CT imaging of the left hip was performed according to the standard protocol. Multiplanar CT image reconstructions were also generated. COMPARISON:  Left hip radiographs performed earlier today at 3:26 p.m. FINDINGS: There is a minimally displaced subcapital fracture through the left femoral neck, with minimal comminution. The maximal head remains seated at the acetabulum. A trace associated joint effusion is noted. No additional fractures are seen. The pubic symphysis is unremarkable in appearance. Mild overlying soft tissue injury is noted at the left hip, without significant soft tissue hematoma. Scattered vascular calcifications are seen. The visualized portions of the bladder are unremarkable. The prostate is enlarged, measuring at least 5.3 cm in transverse dimension. IMPRESSION: 1. Minimally displaced subcapital fracture through the left femoral neck, with minimal comminution. Trace associated left hip joint effusion noted. 2. Mild overlying soft tissue injury, without significant soft tissue hematoma. 3. Scattered vascular calcifications seen. 4. Enlarged prostate noted. Electronically Signed   By: Garald Balding M.D.   On: 05/18/2015 18:21   Dg Chest Port 1 View  06/13/2015  CLINICAL DATA:  Altered mental status with fever and tachycardia. EXAM: PORTABLE CHEST 1 VIEW COMPARISON:  05/18/2015 FINDINGS: Multi lead left-sided pacemaker remains in place. Patient is post median sternotomy. Development of opacity at the left lung base obscuring the left hemidiaphragm. Development of opacity in the medial right  lung base. Cardiomegaly and mediastinal contours are unchanged. Question vascular congestion, no overt edema. No pneumothorax. Osseous structures are unchanged. IMPRESSION: Bibasilar opacities, left greater than right. This may reflect atelectasis, multifocal pneumonia in the setting of fever, or aspiration. Possible left pleural effusion. Electronically Signed   By: Jeb Levering M.D.   On: 06/13/2015 19:25   Dg Abd 2 Views  06/13/2015  CLINICAL DATA:  Nausea and vomiting. EXAM: ABDOMEN - 2 VIEW COMPARISON:  Chest radiographs earlier this day. FINDINGS: Bibasilar airspace opacities. No dilated small bowel loops. No bowel obstruction. No free intra-abdominal air. Small volume of stool throughout the colon, with stool ball distending the rectum. Atherosclerosis noted throughout the abdominal vasculature. Three partially cannulated screws traverse the left hip. IMPRESSION: Nonobstructive bowel gas pattern, no free air. Small stool burden, with stool ball distending the rectum. Electronically Signed   By: Jeb Levering M.D.   On: 06/13/2015 21:55   Dg Hip Port Unilat With Pelvis 1v Left  05/19/2015  CLINICAL DATA:  ORIF traumatic subcapital left femoral neck  fracture. EXAM: DG HIP (WITH OR WITHOUT PELVIS) 1V PORT LEFT COMPARISON:  Preoperative x-rays and CT yesterday. FINDINGS: ORIF of the subcapital left femoral neck fracture with 3 cannulated screws. Alignment appears anatomic. No acute complicating features. Moderate symmetric joint space narrowing in both hips again noted. IMPRESSION: Anatomic alignment post ORIF of the subcapital left femoral neck fracture without acute complicating features. Electronically Signed   By: Evangeline Dakin M.D.   On: 05/19/2015 13:20   Dg Hip Operative Unilat With Pelvis Left  05/19/2015  CLINICAL DATA:  Left femoral neck fracture, cannulated screw fixation. EXAM: OPERATIVE left HIP (WITH PELVIS IF PERFORMED) 3 VIEWS TECHNIQUE: Fluoroscopic spot image(s) were  submitted for interpretation post-operatively. COMPARISON:  05/18/2015 FINDINGS: Three intraoperative spot images of the left hip demonstrate 3 cannulated screws extending axially through the hip, with threaded portions believed to be just distal to the subcapital fracture. IMPRESSION: 1. Cannulated screw fixation of subcapital hip fracture, without complicating feature identified. Electronically Signed   By: Van Clines M.D.   On: 05/19/2015 15:39   Dg Hip Unilat With Pelvis 2-3 Views Left  05/18/2015  CLINICAL DATA:  [place] this morning with left hip pain EXAM: DG HIP (WITH OR WITHOUT PELVIS) 2-3V LEFT COMPARISON:  None. FINDINGS: Moderate bilateral hip arthritis. Pelvic bones appear to be intact. Although fracture line is not identified there is foreshortening of the left proximal femur with cortical irregularity involving the lateral femoral neck. IMPRESSION: A subtle left femoral neck fracture is suspected. CT left hip would be helpful to better evaluate anatomy. Electronically Signed   By: Skipper Cliche M.D.   On: 05/18/2015 15:50   Echo 12. 13 Study Conclusions  - Left ventricle: Systolic function was mildly to moderately reduced. The estimated ejection fraction was in the range of 40% to 45%. Akinesis of the apical myocardium. Akinesis of the anteroseptal myocardium. - Aortic valve: Valve area (Vmax): 2.75 cm^2. - Mitral valve: There was moderate regurgitation. - Left atrium: The atrium was mildly dilated.  Assessment:   Riley Sanchez. is a 81 y.o. male admitted with nv while at rehab and then aspiration pna and found to have bacteremia with enterococcus gallinarum. UCX neg. Fu bcx 12/12 negative. Clinically improving with no fevers, wbc down.  It is unclear what the initial source of NV was but it has improved. LFTs were nml. KUB negative. ECHO TTE negative for vegetations. His sputum cx is pending.   Recommendations Can change unasyn to zosyn. Dc vanco.  FU on sputum  culture to make sure sensitive to unasyn. Can transition to oral augmentin when stable for dc to complete a 10-14 day total abx course.  Stop date 12/26.  Thank you very much for allowing me to participate in the care of this patient. Please call with questions.   Cheral Marker. Ola Spurr, MD

## 2015-06-16 NOTE — Consult Note (Signed)
[facility] CLINIC CARDIOLOGY [facility]  CARDIOLOGY CONSULT NOTE  Patient ID: Riley Sanchez. MRN: [id number] DOB/AGE: [birth year] 79 y.o.  Admit date: 06/13/2015 Referring Physician Dr. mody Primary Physician Dr. Elmer Ramp Primary Cardiologist Dr. Darrold Junker Reason for Consultation svt/pulmonary edema  HPI: Patient is a 81 year old male with history of recent hip surgery.  He has been admitted to a rehab facility where he is quite active in his rehab. He was admitted after not feeling well on day of admission with   Complaints of nausea and  Vomiting along with a choking sensation.  In the emergency room he was noted to be hypoxic and tachypneic and febrile to 103. Concern over possible aspiration pneumonia.  Since admission he has gradually improved.  He is currently anticoagulated with warfarin.  Consultation was called today for evaluation of supraventricular tachycardia and evidence of volume overload on chest x-ray.  Patient denies any significant shortness of breath and is laying in bed .  Chest x-ray revealed left  Basilar airspace opacity consistent with previous study.  There is mild vascular congestion and mild cardiomegaly noted.  He had increased heart rate while ambulating but is normotensive with normal heart rate at rest.  He denies syncope or presyncope chest pain orthopnea or PND.  He had a cough productive of some blood-tinged sputum today.  Was evaluated by Pulmonary and was felt to be a candidate for continued antibiotic therapy along with bronchodilators.  ROS Review of Systems - History obtained from chart review and the patient General ROS: positive for  - fatigue Respiratory ROS: positive for - cough and hemoptysis Cardiovascular ROS: positive for - dyspnea on exertion Gastrointestinal ROS: no abdominal pain, change in bowel habits, or black or bloody stools Neurological ROS: no TIA or stroke symptoms   Past Medical History  Diagnosis Date  . Diabetes mellitus  without complication (HCC)   . Hypertension   . A-fib (HCC)   . GERD (gastroesophageal reflux disease)   . CHF (congestive heart failure) (HCC)   . Fracture, femur (HCC)     History reviewed. No pertinent family history.  Social History   Social History  . Marital Status: Married    Spouse Name: N/A  . Number of Children: N/A  . Years of Education: N/A   Occupational History  . Not on file.   Social History Main Topics  . Smoking status: Former Games developer  . Smokeless tobacco: Not on file  . Alcohol Use: No  . Drug Use: No  . Sexual Activity: Not on file   Other Topics Concern  . Not on file   Social History Narrative    Past Surgical History  Procedure Laterality Date  . Cardiac surgery      bypass in 89  . Cardiac defibrillator placement      replaced 1/16, medtronic  . Cardiac defibrillator placement      medtronic 1/16  . Hip pinning,cannulated Left 05/19/2015    Procedure: CANNULATED HIP PINNING;  Surgeon: Juanell Fairly, MD;  Location: [facility] ORS;  Service: Orthopedics;  Laterality: Left;     Prescriptions prior to admission  Medication Sig Dispense Refill Last Dose  . acetaminophen (TYLENOL) 325 MG tablet Take 650 mg by mouth every 6 (six) hours as needed.   prn at prn  . aspirin EC 81 MG tablet Take 81 mg by mouth daily.   06/13/2015 at 0800  . bisacodyl (DULCOLAX) 10 MG suppository Place 10 mg rectally daily as needed for moderate constipation.  prn at PRN  . cholecalciferol (VITAMIN D) 1000 UNITS tablet Take 1,000 Units by mouth daily.   06/13/2015 at 0600  . diphenhydrAMINE (BENADRYL) 25 MG tablet Take 25 mg by mouth at bedtime as needed for sleep.   prn at prn  . ferrous sulfate 325 (65 FE) MG tablet Take 325 mg by mouth daily with breakfast.   Past Month at Unknown time  . gabapentin (NEURONTIN) 100 MG capsule Take 100 mg by mouth at bedtime.   06/12/2015 at 2000  . lisinopril (PRINIVIL,ZESTRIL) 10 MG tablet Take 10 mg by mouth daily.   06/13/2015 at 0800   . lovastatin (MEVACOR) 40 MG tablet Take 40 mg by mouth at bedtime.   06/12/2015 at 2000  . metFORMIN (GLUCOPHAGE) 1000 MG tablet Take 1,000 mg by mouth 2 (two) times daily with a meal.   06/13/2015 at 1700  . omeprazole (PRILOSEC) 20 MG capsule Take 20 mg by mouth daily.   06/13/2015 at 0600  . oxyCODONE (OXY IR/ROXICODONE) 5 MG immediate release tablet Take 5-10 mg by mouth every 4 (four) hours as needed for severe pain.   06/12/2015 at 2200  . polyethylene glycol (MIRALAX / GLYCOLAX) packet Take 17 g by mouth daily as needed.   PRN at PRN  . promethazine (PHENERGAN) 25 MG/ML injection Inject 25 mg into the vein once.   06/13/2015 at Unknown time  . tamsulosin (FLOMAX) 0.4 MG CAPS capsule Take 0.4 mg by mouth 2 (two) times daily.   06/13/2015 at 2000  . warfarin (COUMADIN) 5 MG tablet Take 5 mg by mouth daily.   06/13/2015 at 1700  . nitroGLYCERIN (NITROSTAT) 0.4 MG SL tablet Place 0.4 mg under the tongue every 5 (five) minutes as needed for chest pain.   prn at prn    Physical Exam: Blood pressure 146/55, pulse 63, temperature 98.5 F (36.9 C), temperature source Oral, resp. rate 20, height  (1.702 m), weight 78.642 kg (173 lb 6 oz), SpO2 96 %.   General appearance: alert and cooperative Resp: clear to auscultation bilaterally Chest wall: right sided chest wall tenderness, left sided chest wall tenderness Cardio: regular rate and rhythm GI: soft, non-tender; bowel sounds normal; no masses,  no organomegaly Extremities: extremities normal, atraumatic, no cyanosis or edema Neurologic: Grossly normal Labs:   Lab Results  Component Value Date   WBC 9.3 06/16/2015   HGB 9.3* 06/16/2015   HCT 27.4* 06/16/2015   MCV 88.6 06/16/2015   PLT 192 06/16/2015    Recent Labs Lab 06/13/15 1948  06/16/15 0635  NA 136  < > 141  K 5.1  < > 3.2*  CL 105  < > 109  CO2 23  < > 25  BUN 19  < > 13  CREATININE 1.09  < > 0.80  CALCIUM 8.6*  < > 8.0*  PROT 6.6  --   --   BILITOT 1.3*  --    --   ALKPHOS 67  --   --   ALT 9*  --   --   AST 13*  --   --   GLUCOSE 216*  < > 113*  < > = values in this interval not displayed. Lab Results  Component Value Date   TROPONINI <0.03 06/13/2015      Radiology:   Probable aspiration pneumonia EKG:  Sinus rhythm  ASSESSMENT AND PLAN:   Patient with history of coronary artery disease paroxysmally atrial fibrillation ischemic cardiomyopathy diastolic heart failure who was admitted  after developing hypoxia probably secondary to aspiration pneumonia.  He is anticoagulated with warfarin.  He currently was felt to have  Hemoptysis and possible worsening pulmonary edema.  Patient is also felt to have tachycardia.  He has a defibrillator pacemaker in place which is functioning normally.  He has had preserved LV function.  He currently does not appear to be in heart failure and heart rate is well controlled.  Continue aggressive treatment of his pneumonia.  Will follow hemodynamics, lung exam and heart rate. Signed: Dalia HeadingFATH,Riley Hester A. MD, [facility] Center[facility] 06/16/2015, 8:10 PM

## 2015-06-16 NOTE — Progress Notes (Signed)
Patient has a 17 beat run of SVT. Will continue to monitor.

## 2015-06-16 NOTE — Progress Notes (Signed)
[facility] [place] [facility]   PATIENT NAME: Riley Sanchez    MR#:  [id number]  DATE OF BIRTH:  [birth year]  SUBJECTIVE:   Patient had an acute episode of hypoxia and was on nonrebreather. He still has some hemoptysis. His urine has no hematuria. He is non-4 L of oxygen. He had a run of SVT last night.  REVIEW OF SYSTEMS:    Review of Systems  Constitutional: Negative for fever, chills and malaise/fatigue.       Poor by mouth intake  HENT: Negative for sore throat.   Eyes: Negative for blurred vision.  Respiratory: Positive for cough. Negative for hemoptysis, shortness of breath and wheezing.   Cardiovascular: Negative for chest pain, palpitations and leg swelling.  Gastrointestinal: Negative for heartburn, nausea, vomiting, abdominal pain, diarrhea and blood in stool.  Genitourinary: Negative for dysuria.  Musculoskeletal: Negative for back pain and joint pain.  Neurological: Positive for weakness. Negative for dizziness, tremors and headaches.  Endo/Heme/Allergies: Does not bruise/bleed easily.    Tolerating Diet: yes     DRUG ALLERGIES:  No Known Allergies  VITALS:  Blood pressure 134/51, pulse 64, temperature 98.6 F (37 C), temperature source Oral, resp. rate 18, height  (1.702 m), weight 78.642 kg (173 lb 6 oz), SpO2 97 %.  PHYSICAL EXAMINATION:   Physical Exam  Constitutional: He is oriented to person, place, and time and well-developed, well-nourished, and in no distress. No distress.  HENT:  Head: Normocephalic.  Eyes: No scleral icterus.  Neck: Normal range of motion. Neck supple. No JVD present. No tracheal deviation present.  Cardiovascular: Normal rate, regular rhythm and normal heart sounds.  Exam reveals no gallop and no friction rub.   No murmur heard. Pulmonary/Chest: Effort normal and breath sounds normal. No respiratory distress. He has no wheezes. He has no rales. He exhibits no tenderness.  Abdominal: Soft.  Bowel sounds are normal. He exhibits no distension and no mass. There is no tenderness. There is no rebound and no guarding.  Musculoskeletal: Normal range of motion. He exhibits no edema.  Neurological: He is alert and oriented to person, place, and time.  Skin: Skin is warm. No rash noted. No erythema.  Pressure ulcer right heel present on admission  Psychiatric: Affect and judgment normal.      LABORATORY PANEL:   CBC  Recent Labs Lab 06/16/15 0635  WBC 9.3  HGB 9.3*  HCT 27.4*  PLT 192   ------------------------------------------------------------------------------------------------------------------  Chemistries   Recent Labs Lab 06/13/15 1948  06/15/15 0507  NA 136  < > 139  K 5.1  < > 3.8  CL 105  < > 110  CO2 23  < > 23  GLUCOSE 216*  < > 120*  BUN 19  < > 18  CREATININE 1.09  < > 0.89  CALCIUM 8.6*  < > 8.0*  AST 13*  --   --   ALT 9*  --   --   ALKPHOS 67  --   --   BILITOT 1.3*  --   --   < > = values in this interval not displayed. ------------------------------------------------------------------------------------------------------------------  Cardiac Enzymes  Recent Labs Lab 06/13/15 1948  TROPONINI <0.03   ------------------------------------------------------------------------------------------------------------------  RADIOLOGY:  Dg Chest 1 View  06/16/2015  CLINICAL DATA:  Acute onset of shortness of breath and productive cough. Recent internal fixation of left femoral fracture. Initial encounter. EXAM: CHEST 1 VIEW COMPARISON:  Chest radiograph performed 06/15/2015 FINDINGS: Left basilar  airspace opacification raises concern for pneumonia. Mild vascular congestion is noted. Mild right basilar airspace opacity is also seen. No definite pleural effusion or pneumothorax identified. The cardiomediastinal silhouette is mildly enlarged. The patient is status post median sternotomy, with evidence of prior CABG. A pacemaker/AICD is noted overlying  the left chest wall, with leads ending overlying the right atrium and right ventricle. No acute osseous abnormalities are identified. IMPRESSION: 1. Left basilar airspace opacification raises concern for pneumonia. Mild right basilar airspace opacity also noted. This is similar in appearance to the prior study. 2. Mild vascular congestion and mild cardiomegaly noted. Electronically Signed   By: Roanna Raider M.D.   On: 06/16/2015 06:59   Dg Chest 1 View  06/15/2015  CLINICAL DATA:  Pneumonia, sepsis ; history of CHF and recent surgery for left hip fracture EXAM: CHEST 1 VIEW COMPARISON:  Portable chest x-ray of June 13, 2015 FINDINGS: The lungs are adequately inflated. Bibasilar pneumonia persists. There is no significant pleural effusion. The cardiac silhouette is mildly enlarged. The pulmonary vascularity is not engorged. A permanent pacemaker defibrillator is in reasonable position. The patient has undergone previous CABG. The observed bony thorax exhibits no acute abnormality. IMPRESSION: Persistent bibasilar infiltrates. No significant pleural effusion is evident. Stable cardiomegaly without significant pulmonary vascular congestion. Electronically Signed   By: David  [place] M.D.   On: 06/15/2015 07:48     ASSESSMENT AND PLAN:   81 year old male who was status post hip surgery on November 15 who presented from nursing home with sepsis and acute hypoxic respiratory failure.  1. Sepsis: This is due to bilateral basal pneumonia. Patient was on broad-spectrum antibiotics including vancomycin, Zosyn . His blood culture is growing out VRE. I have discontinued vancomycin and will continue Zosyn. I have also placed ID consultation for antibiotics and further workup if needed in this patient with recent hip surgery.   2. Pneumonia: Healthcare pneumonia: Patient passed swallow evaluation.  Continue Zosyn for now. 3. Acute hypoxic respiratory failure: Patient had an episode of acute hypoxic respiratory  failure last night. He was placed on a nonrebreather. He is back on nasal cannula and tolerating this well. He did receive 1 dose of Lasix. Today's chest x-ray shows pulmonary edema. I have ordered 2-D echocardiogram and will order Lasix 20 mg IV daily. Stop IV fluids for now. Patient will be ruled out for congestive heart failure..  4. Recent hip surgery: Patient is non bearing weight on left leg.  5. Heel ulcer present on admission: As per wound care consultation Cleanse right heel with soap and water and pat gently dry. Apply calcium alginate to wound bed. Cover with 4x4 gauze and secure with kerlix and tape.Change daily. Will add Prevalon boot to right heel to offload pressure.   6. Diabetes type 2 without complication: Continue sliding scale insulin, ADA diet. I will discontinue scheduled insulin due to patient's poor by mouth intake.  7. PAF: Continue Coumadin as per pharmacy consultation.  8. History of ischemic cardiomyopathy with CABG: Continue aspirin and lisinopril and statin.  9. BPH: Continue Flomax  10. Hematuria: This is due to supratherapeutic INR. Appreciate urology consultation. His urine does not show any evidence of hematuria this time.  11. Hemoptysis: Patient continues to have some hemoptysis. I will consult pulmonary for further evaluation and management. This is also suspected to be due to supratherapeutic INR. INR is within the 2-3 range today. Hemoglobin is 9.3 this morning. There is no indication for blood transfusion at this time.  Continue monitor.  12. [facility] evaluation and management. BMP and magnesium this morning. Echocardiogram are ordered.  Management plans discussed with the patient and he is in agreement.  CODE STATUS: FULL  TOTAL TIME TAKING CARE OF THIS PATIENT: 30 minutes.    POSSIBLE D/C 2-4 days, DEPENDING ON CLINICAL CONDITION to skilled nursing facility.  Raidyn Wassink M.D on 06/16/2015 at 9:13 AM  Between  7am to 6pm - Pager - [phone] After 6pm go to [email or web address] - password [email or web address]  [facility]  Office  [phone]  CC: Primary care physician; Sula RumpleVirk, Charanjit, MD  Note: This dictation was prepared with Dragon dictation along with smaller phrase technology. Any transcriptional errors that result from this process are unintentional.

## 2015-06-16 NOTE — Progress Notes (Signed)
Initial Nutrition Assessment   INTERVENTION:   Meals and Snacks: Cater to patient preferences Medical Food Supplement Therapy: will recommend Ensure Enlive po TID, each supplement provides 350 kcal and 20 grams of protein and Magic Cup BID, pt likes chocolate flavor   NUTRITION DIAGNOSIS:   Inadequate oral intake related to acute illness as evidenced by per patient/family report, meal completion < 50%.  GOAL:   Patient will meet greater than or equal to 90% of their needs  MONITOR:    (Energy Intake, Anthropometrics, Pulmonary Profile, Digestive System)  REASON FOR ASSESSMENT:   Consult Poor PO  ASSESSMENT:   Pt admitted with fever secondary to sepsis. Pulmonolgy following for hematemesis and pna.   Pt with recent ORIF on 05/19/2015 for femur fracture.  Per MD note: Patient had an acute episode of hypoxia and was on nonrebreather. He still has some hemoptysis. His urine has no hematuria. He is non-4 L of oxygen. He had a run of SVT last night.  Past Medical History  Diagnosis Date  . Diabetes mellitus without complication (HCC)   . Hypertension   . A-fib (HCC)   . GERD (gastroesophageal reflux disease)   . CHF (congestive heart failure) (HCC)   . Fracture, femur (HCC)      Diet Order:  DIET SOFT Room service appropriate?: Yes; Fluid consistency:: Thin    Current Nutrition: Pt reports trying to eat fruit plate and cottage cheese for lunch today but reports unable to eat much secondary to coughing. Pt reports poor appetite since admission, documented <25% of meals.  Food/Nutrition-Related History: [facility] and pt was eating most of what he could get but sometimes pt did not like the food, otherwise pt's appetite was doing ok. Pt reports having 2 Boost at home PTA.   Scheduled Medications:  . ampicillin-sulbactam (UNASYN) IV  3 g Intravenous Q6H  . feeding supplement (ENSURE ENLIVE)  237 mL Oral TID WC  . ferrous sulfate  325 mg  Oral Q breakfast  . furosemide  20 mg Intravenous Daily  . gabapentin  100 mg Oral QHS  . Influenza vac split quadrivalent PF  0.5 mL Intramuscular Tomorrow-1000  . insulin aspart  0-9 Units Subcutaneous TID WC  . magic mouthwash  10 mL Oral QID  . pantoprazole  40 mg Oral Daily  . pravastatin  10 mg Oral q1800  . promethazine  12.5 mg Intravenous Once  . sodium chloride  3 mL Intravenous Q12H  . tamsulosin  0.4 mg Oral BID  . warfarin  2.5 mg Oral ONCE-1800  . Warfarin - Pharmacist Dosing Inpatient   Does not apply q1800     Electrolyte/Renal Profile and Glucose Profile:   Recent Labs Lab 06/14/15 0435 06/15/15 0507 06/16/15 0635  NA 137 139 141  K 5.0 3.8 3.2*  CL 109 110 109  CO2 23 23 25   BUN 20 18 13   CREATININE 0.89 0.89 0.80  CALCIUM 7.9* 8.0* 8.0*  MG  --   --  1.4*  GLUCOSE 161* 120* 113*   Protein Profile:  Recent Labs Lab 06/13/15 1948  ALBUMIN 3.3*    Gastrointestinal Profile: Last BM:  06/15/2015   Nutrition-Focused Physical Exam Findings:  Unable to complete Nutrition-Focused physical exam at this time. Attempted physical exam however very limited on visit today.   Weight Change: Pt reports stable weight around 173lbs    Skin:   (Stage II heel pressure ulcer)   Height:   Ht  Readings from Last 1 Encounters:  06/13/15  (1.702 m)    Weight:   Wt Readings from Last 1 Encounters:  06/13/15 173 lb 6 oz (78.642 kg)     BMI:  Body mass index is 27.15 kg/(m^2).  Estimated Nutritional Needs:   Kcal:  BEE: 1434kcals, TEE: (IF 1.1-1.3)(AF 1.2) [id number]  Protein:  79-94g protein (1.0-1.2g/kg)  Fluid:  1965-235mL of fluid (25-38mL/kg)  EDUCATION NEEDS:   No education needs identified at this time    MODERATE Care Level  Leda Quail, RD, LDN Pager [phone]

## 2015-06-16 NOTE — [place] FL2 (Signed)
[place] MEDICAID FL2 LEVEL OF CARE SCREENING TOOL     IDENTIFICATION  Patient Name: Riley BimlerMilton Chaudhari Jr. Birthdate: Nov 10, 1929 Sex: male Admission Date (Current Location): 06/13/2015  [place]  [facility]([place] )   Facility and Address:  [facility], [address], [place], [place] [address]      Provider Number: [id number]  Attending Physician Name and Address:  Adrian SaranSital Mody, MD  Relative Name and Phone Number:       Current Level of Care: Hospital Recommended Level of Care: Skilled Nursing Facility Prior Approval Number:    Date Approved/Denied:   PASRR Number:  ( [id number] )  Discharge Plan: SNF    Current Diagnoses: Patient Active Problem List   Diagnosis Date Noted  . Pressure ulcer 06/16/2015  . Gross hematuria   . Sepsis (HCC) 06/13/2015  . Hip fracture (HCC) 05/18/2015    Orientation RESPIRATION BLADDER Height & Weight    Self, Time, Situation, Place  O2 (2 Liters oxygen ) Continent 5\' 7"  (170.2 cm) 173 lbs.  BEHAVIORAL SYMPTOMS/MOOD NEUROLOGICAL BOWEL NUTRITION STATUS   (none )  (none ) Continent Diet (Diet: Soft )  AMBULATORY STATUS COMMUNICATION OF NEEDS Skin   Extensive Assist Verbally PU Stage and Appropriate Care (Pressure Ulcer Stage 2: Right Heel. Pressure Ulcer Stage 1: Left  Heel. )                       Personal Care Assistance Level of Assistance  Bathing, Feeding, Dressing Bathing Assistance: Limited assistance Feeding assistance: Independent Dressing Assistance: Limited assistance     Functional Limitations Info  Sight, Hearing, Speech Sight Info: Adequate Hearing Info: Adequate Speech Info: Adequate    SPECIAL CARE FACTORS FREQUENCY  PT (By licensed PT), OT (By licensed OT)     PT Frequency:  (5) OT Frequency:  (5)            Contractures      Additional Factors Info  Code Status, Insulin Sliding Scale, Isolation Precautions Code Status Info:  (Full Code.  ) Allergies Info:  (Bactrim Sulfamethoxazole-trimethoprim)   Insulin Sliding Scale Info:  (Novolog Insulin Injections: 3 times daily. ) Isolation Precautions Info:  (VRE)     Current Medications (06/16/2015):  This is the current hospital active medication list Current Facility-Administered Medications  Medication Dose Route Frequency Provider Last Rate Last Dose  . acetaminophen (TYLENOL) tablet 650 mg  650 mg Oral Q6H PRN Katharina Caperima Vaickute, MD   650 mg at 06/15/15 0805   Or  . acetaminophen (TYLENOL) suppository 650 mg  650 mg Rectal Q6H PRN Katharina Caperima [place]ick[place], MD   650 mg at 06/13/15 2110  . bisacodyl (DULCOLAX) suppository 10 mg  10 mg Rectal Daily PRN Katharina Caperima [place]ick[place], MD      . chlorpheniramine-HYDROcodone (TUSSIONEX) 10-8 MG/5ML suspension 5 mL  5 mL Oral Q12H PRN Adrian SaranSital Mody, MD   5 mL at 06/16/15 0657  . ferrous sulfate tablet 325 mg  325 mg Oral Q breakfast Adrian SaranSital Mody, MD   325 mg at 06/16/15 0957  . furosemide (LASIX) injection 20 mg  20 mg Intravenous Daily Adrian SaranSital Mody, MD   20 mg at 06/16/15 0958  . gabapentin (NEURONTIN) capsule 100 mg  100 mg Oral QHS Adrian SaranSital Mody, MD   100 mg at 06/15/15 2133  . Influenza vac split quadrivalent PF (FLUARIX) injection 0.5 mL  0.5 mL Intramuscular Tomorrow-1000 Sital Mody, MD      . insulin aspart (novoLOG)  injection 0-9 Units  0-9 Units Subcutaneous TID WC Katharina Caper, MD   1 Units at 06/15/15 1147  . magic mouthwash  10 mL Oral QID Adrian Saran, MD   10 mL at 06/16/15 0958  . nitroGLYCERIN (NITROSTAT) SL tablet 0.4 mg  0.4 mg Sublingual Q5 min PRN Katharina Caper, MD      . ondansetron (ZOFRAN) tablet 4 mg  4 mg Oral Q6H PRN Katharina Caper, MD       Or  . ondansetron (ZOFRAN) injection 4 mg  4 mg Intravenous Q6H PRN Katharina Caper, MD   4 mg at 06/15/15 1016  . oxyCODONE (Oxy IR/ROXICODONE) immediate release tablet 5-10 mg  5-10 mg Oral Q4H PRN Adrian Saran, MD   5 mg at 06/14/15 1155  . pantoprazole (PROTONIX) EC tablet 40 mg  40 mg Oral Daily Adrian Saran, MD    40 mg at 06/16/15 0958  . piperacillin-tazobactam (ZOSYN) IVPB 3.375 g  3.375 g Intravenous 3 times per day Katharina Caper, MD   3.375 g at 06/16/15 0251  . pravastatin (PRAVACHOL) tablet 10 mg  10 mg Oral q1800 Adrian Saran, MD   10 mg at 06/15/15 1753  . promethazine (PHENERGAN) injection 12.5 mg  12.5 mg Intravenous Once Adrian Saran, MD   12.5 mg at 06/14/15 1100  . sodium chloride 0.9 % injection 3 mL  3 mL Intravenous Q12H Katharina Caper, MD   3 mL at 06/16/15 0958  . tamsulosin (FLOMAX) capsule 0.4 mg  0.4 mg Oral BID Adrian Saran, MD   0.4 mg at 06/16/15 0957  . warfarin (COUMADIN) tablet 2.5 mg  2.5 mg Oral ONCE-1800 Olene Floss, RPH      . Warfarin - Pharmacist Dosing Inpatient   Does not apply q1800 Gardner Candle, [facility]         Discharge Medications: Please see discharge summary for a list of discharge medications.  Relevant Imaging Results:  Relevant Lab Results:   Additional Information  (SSN: [id number])  Haig Prophet, LCSW

## 2015-06-16 NOTE — Progress Notes (Signed)
Patient transferred from ICU to 1A. FL2 complete. Per Neena RhymesJoann Hawfields admissions coordinator patient can return to his private room on [address]. Clinical Social Worker (CSW) will continue to follow and assist as needed.   Jetta LoutBailey Morgan, LCSWA [phone]

## 2015-06-16 NOTE — Progress Notes (Signed)
Weaned down to 02 4 liters . Sat 95>

## 2015-06-16 NOTE — Progress Notes (Signed)
This encounter was created in error - please disregard.

## 2015-06-16 NOTE — Progress Notes (Addendum)
ANTICOAGULATION CONSULT NOTE - Initial Consult  Pharmacy Consult for warfarin Indication: atrial fibrillation  No Known Allergies  Patient Measurements: Height: 5\' 7"  (170.2 cm) Weight: 173 lb 6 oz (78.642 kg) IBW/kg (Calculated) : 66.1  Vital Signs: Temp: 98.6 F (37 C) (12/13 0728) Temp Source: Oral (12/13 0728) BP: 134/51 mmHg (12/13 0728) Pulse Rate: 64 (12/13 0728)  Labs:  Recent Labs  06/13/15 1948 06/14/15 0435 06/14/15 1140 06/15/15 0507 06/15/15 1922 06/16/15 0635  HGB 12.3* 11.0*  --  9.8* 8.8* 9.3*  HCT 37.1* 32.8*  --  29.7* 26.0* 27.4*  PLT 270 198  --  173  --  192  LABPROT  --   --  45.2* 48.5*  --  29.4*  INR  --   --  5.04* 5.53*  --  2.84  CREATININE 1.09 0.89  --  0.89  --   --   TROPONINI <0.03  --   --   --   --   --     Estimated Creatinine Clearance: 58.8 mL/min (by C-G formula based on Cr of 0.89).   Medical History: Past Medical History  Diagnosis Date  . Diabetes mellitus without complication (HCC)   . Hypertension   . A-fib (HCC)   . GERD (gastroesophageal reflux disease)   . CHF (congestive heart failure) (HCC)   . Fracture, femur [facility](HCC)     Assessment: 81 yo patient need anticoagulation with warfarin. Pharmacy consulted for dosing and monitoring.  Prior to admission patient was taking warfarin 5mg  daily. Patient was supratherapeutic on admission.   12/9: INR 3.53 12/11: INR 5.04 12/12: INR 5.53  Goal of Therapy:  INR 2-3 Monitor platelets by anticoagulation protocol: Yes   Plan:  INR is therapeutic today. Will restart warfarin at 2.5mg  Tues/Thus and 5mg  all other days. Recheck INR tomorrow AM.   Olene FlossMelissa D Miriya Cloer, Pharm.D Clinical Pharmacist    06/16/2015,7:41 AM

## 2015-06-16 NOTE — Care Management Important Message (Signed)
Important Message  Patient Details  Name: Riley BimlerMilton Delisle Jr. MRN: [id number] Date of Birth: 1929-12-13   Medicare Important Message Given:  Yes    Marily MemosLisa M Bernadett Milian, RN 06/16/2015, 10:06 AM

## 2015-06-16 NOTE — Progress Notes (Signed)
Dr mody on floor to eval pt . md shown tele strips of  17 bt run last noc. Dr mody acknowledged but no chg made

## 2015-06-16 NOTE — Progress Notes (Signed)
Patient O2 sats was sustaining at 87% and would not go higher. Dr. called, new orders given, will continue to monitor.

## 2015-06-16 NOTE — Consult Note (Signed)
[facility] [place] Pulmonary Medicine Consultation      Date: 06/16/2015,   MRN# [id number] Riley Sanchez. 1929/07/13 Code Status:     Code Status Orders        Start     Ordered   06/14/15 0311  Full code   Continuous     06/14/15 0310     Hosp day:@LENGTHOFSTAYDAYS @ Referring MD: @ATDPROV @     PCP:      AdmissionWeight: 173 lb 6 oz (78.642 kg)                 CurrentWeight: 173 lb 6 oz (78.642 kg) Riley Sanchez. is a 81 y.o. old male seen in consultation for hemoptysis at the request of Dr. Juliene Pina.     CHIEF COMPLAINT:   hemoptysis   HISTORY OF PRESENT ILLNESS   81 y.o. male with a known history of admission in November 2016 for impacted left femoral neck fracture status post ORIF on 05/19/2015 by Dr. Martha Clan presents to the hospital from skilled nursing facility rehabilitation with  1 day history of nausea, vomiting and coughing.  Patient also with chills.  He had choking sensation and has been coughing and was sent to emergency room for further evaluation where he was noted to be hypoxic, tachypneic, febrile to 103. His O2 sats were as low as 85%.  Patient also had associated hemoptysis(pink tinged sputum), patient takes coumadin INR was noted to be elevated. Patient now on nasal cannula, sitting up comfortably, main complaint now is cough and chest congestion.   CXR c/w left lung opacity     PAST MEDICAL HISTORY   Past Medical History  Diagnosis Date  . Diabetes mellitus without complication (HCC)   . Hypertension   . A-fib (HCC)   . GERD (gastroesophageal reflux disease)   . CHF (congestive heart failure) (HCC)   . Fracture, femur (HCC)      SURGICAL HISTORY   Past Surgical History  Procedure Laterality Date  . Cardiac surgery      bypass in 89  . Cardiac defibrillator placement      replaced 1/16, medtronic  . Cardiac defibrillator placement      medtronic 1/16  . Hip pinning,cannulated Left 05/19/2015    Procedure: CANNULATED HIP  PINNING;  Surgeon: Juanell Fairly, MD;  Location: [facility] ORS;  Service: Orthopedics;  Laterality: Left;     FAMILY HISTORY   History reviewed. No pertinent family history.  No family h/o cancer fam hist +HTN    SOCIAL HISTORY  Pateint quit tobacco 35 years ago, but smoked pipe for 35 years daily Social History  Substance Use Topics  . Smoking status: Former Games developer  . Smokeless tobacco: None  . Alcohol Use: No   Patient   MEDICATIONS    Home Medication:  No current outpatient prescriptions on file.  Current Medication:  Current facility-administered medications:  .  acetaminophen (TYLENOL) tablet 650 mg, 650 mg, Oral, Q6H PRN, 650 mg at 06/15/15 0805 **OR** acetaminophen (TYLENOL) suppository 650 mg, 650 mg, Rectal, Q6H PRN, Katharina Caper, MD, 650 mg at 06/13/15 2110 .  bisacodyl (DULCOLAX) suppository 10 mg, 10 mg, Rectal, Daily PRN, Katharina Caper, MD .  chlorpheniramine-HYDROcodone (TUSSIONEX) 10-8 MG/5ML suspension 5 mL, 5 mL, Oral, Q12H PRN, Adrian Saran, MD, 5 mL at 06/16/15 0657 .  ferrous sulfate tablet 325 mg, 325 mg, Oral, Q breakfast, Sital Mody, MD, 325 mg at 06/16/15 0957 .  furosemide (LASIX) injection 20 mg, 20 mg, Intravenous, Daily, Sital Mody,  MD, 20 mg at 06/16/15 0958 .  gabapentin (NEURONTIN) capsule 100 mg, 100 mg, Oral, QHS, Adrian Saran, MD, 100 mg at 06/15/15 2133 .  Influenza vac split quadrivalent PF (FLUARIX) injection 0.5 mL, 0.5 mL, Intramuscular, Tomorrow-1000, Sital Mody, MD .  insulin aspart (novoLOG) injection 0-9 Units, 0-9 Units, Subcutaneous, TID WC, Katharina Caper, MD, 1 Units at 06/15/15 1147 .  magic mouthwash, 10 mL, Oral, QID, Sital Mody, MD, 10 mL at 06/16/15 1359 .  nitroGLYCERIN (NITROSTAT) SL tablet 0.4 mg, 0.4 mg, Sublingual, Q5 min PRN, Katharina Caper, MD .  ondansetron (ZOFRAN) tablet 4 mg, 4 mg, Oral, Q6H PRN **OR** ondansetron (ZOFRAN) injection 4 mg, 4 mg, Intravenous, Q6H PRN, Katharina Caper, MD, 4 mg at 06/15/15 1016 .  oxyCODONE  (Oxy IR/ROXICODONE) immediate release tablet 5-10 mg, 5-10 mg, Oral, Q4H PRN, Adrian Saran, MD, 5 mg at 06/14/15 1155 .  pantoprazole (PROTONIX) EC tablet 40 mg, 40 mg, Oral, Daily, Adrian Saran, MD, 40 mg at 06/16/15 0958 .  piperacillin-tazobactam (ZOSYN) IVPB 3.375 g, 3.375 g, Intravenous, 3 times per day, Katharina Caper, MD, 3.375 g at 06/16/15 1209 .  pravastatin (PRAVACHOL) tablet 10 mg, 10 mg, Oral, q1800, Adrian Saran, MD, 10 mg at 06/15/15 1753 .  promethazine (PHENERGAN) injection 12.5 mg, 12.5 mg, Intravenous, Once, Sital Mody, MD, 12.5 mg at 06/14/15 1100 .  sodium chloride 0.9 % injection 3 mL, 3 mL, Intravenous, Q12H, Katharina Caper, MD, 3 mL at 06/16/15 0958 .  tamsulosin (FLOMAX) capsule 0.4 mg, 0.4 mg, Oral, BID, Adrian Saran, MD, 0.4 mg at 06/16/15 0957 .  warfarin (COUMADIN) tablet 2.5 mg, 2.5 mg, Oral, ONCE-1800, Melissa D Maccia, RPH .  Warfarin - Pharmacist Dosing Inpatient, , Does not apply, q1800, Sheema M Hallaji, RPH    ALLERGIES   Review of patient's allergies indicates no known allergies.     REVIEW OF SYSTEMS   Review of Systems  Constitutional: Positive for chills and malaise/fatigue. Negative for fever, weight loss and diaphoresis.  Eyes: Negative for blurred vision.  Respiratory: Positive for cough and shortness of breath. Negative for hemoptysis and sputum production.   Cardiovascular: Negative for chest pain, palpitations and orthopnea.  Gastrointestinal: Negative for nausea, vomiting and abdominal pain.  Skin: Negative for rash.  Neurological: Negative for dizziness, tingling and headaches.  Psychiatric/Behavioral: The patient is not nervous/anxious.      VS: BP 146/55 mmHg  Pulse 63  Temp(Src) 98.5 F (36.9 C) (Oral)  Resp 20  Ht 5\' 7"  (1.702 m)  Wt 173 lb 6 oz (78.642 kg)  BMI 27.15 kg/m2  SpO2 96%     PHYSICAL EXAM  Physical Exam  Constitutional: He is oriented to person, place, and time. No distress.  HENT:  Head: Normocephalic and  atraumatic.  Eyes: Conjunctivae are normal. Pupils are equal, round, and reactive to light.  Neck: Normal range of motion. Neck supple.  Cardiovascular: Normal rate and regular rhythm.   No murmur heard. Pulmonary/Chest: Effort normal. No respiratory distress. He has no wheezes. He has rales.  Abdominal: Soft. Bowel sounds are normal.  Musculoskeletal: Normal range of motion. He exhibits edema.  Neurological: He is alert and oriented to person, place, and time. No cranial nerve deficit. Coordination normal.  Skin: Skin is warm. He is not diaphoretic.  Psychiatric: He has a normal mood and affect.        LABS    Recent Labs     06/13/15  1948  06/14/15  0435  06/14/15  1140  06/15/15  0507  06/15/15  1922  06/16/15  0635  HGB  12.3*  11.0*   --   9.8*  8.8*  9.3*  HCT  37.1*  32.8*   --   29.7*  26.0*  27.4*  MCV  90.4  89.4   --   89.7   --   88.6  WBC  14.4*  12.8*   --   10.6   --   9.3  BUN  19  20   --   18   --   13  CREATININE  1.09  0.89   --   0.89   --   0.80  GLUCOSE  216*  161*   --   120*   --   113*  CALCIUM  8.6*  7.9*   --   8.0*   --   8.0*  INR   --    --   5.04*  5.53*   --   2.84  ,    No results for input(s): PH in the last 72 hours.  Invalid input(s): PCO2, PO2, BASEEXCESS, BASEDEFICITE, TFT    CULTURE RESULTS   Recent Results (from the past 240 hour(s))  Blood Culture (routine x 2)     Status: None (Preliminary result)   Collection Time: 06/13/15  7:40 PM  Result Value Ref Range Status   Specimen Description BLOOD LEFT ASSIST CONTROL  Final   Special Requests BOTTLES DRAWN AEROBIC AND ANAEROBIC 4CC  Final   Culture  Setup Time   Final    GRAM POSITIVE COCCI ANAEROBIC BOTTLE ONLY CRITICAL RESULT CALLED TO, READ BACK BY AND VERIFIED WITH: CHERYL SMITH,RN 06/14/2015 1405 BY JRS.    Culture   Final    ENTEROCOCCUS GALLINARUM ANAEROBIC BOTTLE ONLY Results Called to: APRIL RAST AT 0755 06/16/15 DV    Report Status PENDING  Incomplete    Organism ID, Bacteria ENTEROCOCCUS GALLINARUM  Final      Susceptibility   Enterococcus gallinarum - MIC*    AMPICILLIN <=2 SENSITIVE Sensitive     LINEZOLID Value in next row Sensitive      SENSITIVE2    VANCOMYCIN Value in next row Resistant      RESISTANT<=0.5    GENTAMICIN SYNERGY Value in next row Sensitive      RESISTANT<=0.5    * ENTEROCOCCUS GALLINARUM  Blood Culture (routine x 2)     Status: None (Preliminary result)   Collection Time: 06/13/15  7:40 PM  Result Value Ref Range Status   Specimen Description BLOOD LEFT HAND  Final   Special Requests BOTTLES DRAWN AEROBIC AND ANAEROBIC 4CC  Final   Culture NO GROWTH 3 DAYS  Final   Report Status PENDING  Incomplete  Urine culture     Status: None   Collection Time: 06/13/15  7:41 PM  Result Value Ref Range Status   Specimen Description URINE, RANDOM  Final   Special Requests NONE  Final   Culture NO GROWTH 2 DAYS  Final   Report Status 06/15/2015 FINAL  Final  MRSA PCR Screening     Status: None   Collection Time: 06/13/15 11:48 PM  Result Value Ref Range Status   MRSA by PCR NEGATIVE NEGATIVE Final    Comment:        The GeneXpert MRSA Assay (FDA approved for NASAL specimens only), is one component of a comprehensive MRSA colonization surveillance program. It is not intended to diagnose MRSA infection nor to  guide or monitor treatment for MRSA infections.   Culture, blood (routine x 2)     Status: None (Preliminary result)   Collection Time: 06/15/15 12:10 PM  Result Value Ref Range Status   Specimen Description BLOOD RIGHT ASSIST CONTROL  Final   Special Requests BOTTLES DRAWN AEROBIC AND ANAEROBIC  3CC  Final   Culture NO GROWTH < 24 HOURS  Final   Report Status PENDING  Incomplete  Culture, blood (routine x 2)     Status: None (Preliminary result)   Collection Time: 06/15/15 12:10 PM  Result Value Ref Range Status   Specimen Description BLOOD RIGHT HAND  Final   Special Requests BOTTLES DRAWN AEROBIC AND  ANAEROBIC  5CC  Final   Culture NO GROWTH < 24 HOURS  Final   Report Status PENDING  Incomplete  Urine culture     Status: None (Preliminary result)   Collection Time: 06/15/15  7:00 PM  Result Value Ref Range Status   Specimen Description URINE, CATHETERIZED  Final   Special Requests NONE  Final   Culture NO GROWTH < 24 HOURS  Final   Report Status PENDING  Incomplete  C difficile quick scan w PCR reflex     Status: None   Collection Time: 06/15/15  7:00 PM  Result Value Ref Range Status   C Diff antigen NEGATIVE NEGATIVE Final   C Diff toxin NEGATIVE NEGATIVE Final   C Diff interpretation Negative for C. difficile  Final  Culture, expectorated sputum-assessment     Status: None   Collection Time: 06/16/15 10:15 AM  Result Value Ref Range Status   Specimen Description SPUTUM  Final   Special Requests Normal  Final   Sputum evaluation THIS SPECIMEN IS ACCEPTABLE FOR SPUTUM CULTURE  Final   Report Status 06/16/2015 FINAL  Final          IMAGING    Dg Chest 1 View  06/16/2015  CLINICAL DATA:  Acute onset of shortness of breath and productive cough. Recent internal fixation of left femoral fracture. Initial encounter. EXAM: CHEST 1 VIEW COMPARISON:  Chest radiograph performed 06/15/2015 FINDINGS: Left basilar airspace opacification raises concern for pneumonia. Mild vascular congestion is noted. Mild right basilar airspace opacity is also seen. No definite pleural effusion or pneumothorax identified. The cardiomediastinal silhouette is mildly enlarged. The patient is status post median sternotomy, with evidence of prior CABG. A pacemaker/AICD is noted overlying the left chest wall, with leads ending overlying the right atrium and right ventricle. No acute osseous abnormalities are identified. IMPRESSION: 1. Left basilar airspace opacification raises concern for pneumonia. Mild right basilar airspace opacity also noted. This is similar in appearance to the prior study. 2. Mild vascular  congestion and mild cardiomegaly noted. Electronically Signed   By: Roanna RaiderJeffery  Chang M.D.   On: 06/16/2015 06:59   Dg Chest 1 View  06/15/2015  CLINICAL DATA:  Pneumonia, sepsis ; history of CHF and recent surgery for left hip fracture EXAM: CHEST 1 VIEW COMPARISON:  Portable chest x-ray of June 13, 2015 FINDINGS: The lungs are adequately inflated. Bibasilar pneumonia persists. There is no significant pleural effusion. The cardiac silhouette is mildly enlarged. The pulmonary vascularity is not engorged. A permanent pacemaker defibrillator is in reasonable position. The patient has undergone previous CABG. The observed bony thorax exhibits no acute abnormality. IMPRESSION: Persistent bibasilar infiltrates. No significant pleural effusion is evident. Stable cardiomegaly without significant pulmonary vascular congestion. Electronically Signed   By: David  SwazilandJordan M.D.   On:  06/15/2015 07:48   Dg Chest 1 View  05/18/2015  CLINICAL DATA:  Larey Seat today at 0900 hours from a standing position. Fall occurred while doing balancing exercises. EXAM: CHEST 1 VIEW COMPARISON:  10/29/2014 FINDINGS: Previous median sternotomy and CABG. The heart remains mildly enlarged. Atherosclerosis of the aorta again seen. Pacemaker/ AICD inserted from the left appears unchanged. The pulmonary vascularity is normal. The lungs are clear. No effusions. IMPRESSION: No change.  No active disease.  Previous CABG and pacemaker/ AICD. Electronically Signed   By: Paulina Fusi M.D.   On: 05/18/2015 15:50   Ct Head Wo Contrast  06/13/2015  CLINICAL DATA:  Acute encephalopathy.  Nausea and vomiting. EXAM: CT HEAD WITHOUT CONTRAST TECHNIQUE: Contiguous axial images were obtained from the base of the skull through the vertex without intravenous contrast. COMPARISON:  Head CT 05/18/2015 FINDINGS: No intracranial hemorrhage, mass effect, or midline shift. Stable atrophy and chronic small vessel ischemia from prior exam. Remote lacunar infarcts in  the bilateral basal ganglia and right cerebellum, unchanged. No hydrocephalus. The basilar cisterns are patent. No evidence of territorial infarct. No intracranial fluid collection. Atherosclerosis of skullbase vasculature. Calvarium is intact. Chronic opacification of right side of sphenoid sinus with small fluid level and mucosal thickening in the left side of sphenoid sinus and throughout the ethmoid air cells, with minimal improvement from prior. Mastoid air cells are well aerated. IMPRESSION: 1. Stable atrophy and chronic small vessel ischemia without acute intracranial abnormality. 2. Chronic sinusitis, with mild improvement from prior. Electronically Signed   By: Rubye Oaks M.D.   On: 06/13/2015 22:00   Ct Head Wo Contrast  05/18/2015  CLINICAL DATA:  Fall. EXAM: CT HEAD WITHOUT CONTRAST TECHNIQUE: Contiguous axial images were obtained from the base of the skull through the vertex without intravenous contrast. COMPARISON:  06/22/2011 FINDINGS: Chronic bilateral basal ganglia lacunar infarcts noted. There is mild low attenuation throughout the subcortical and periventricular white matter compatible with chronic microvascular disease. Prominence of the sulci and ventricles noted. No abnormal extra-axial fluid collections, intracranial hemorrhage or mass identified. No evidence for acute infarct. Partial opacification of the ethmoid air cells and sphenoid sinus noted. The mastoid air cells appear clear. The calvarium is intact. IMPRESSION: 1. No acute intracranial abnormalities. 2. Chronic microvascular disease and brain atrophy. 3. Ethmoid air cell and sphenoid sinus opacification. Electronically Signed   By: Signa Kell M.D.   On: 05/18/2015 17:02   Ct Hip Left Wo Contrast  05/18/2015  CLINICAL DATA:  Status post fall onto left hip, with left hip pain. Initial encounter. EXAM: CT OF THE LEFT HIP WITHOUT CONTRAST TECHNIQUE: Multidetector CT imaging of the left hip was performed according to the  standard protocol. Multiplanar CT image reconstructions were also generated. COMPARISON:  Left hip radiographs performed earlier today at 3:26 p.m. FINDINGS: There is a minimally displaced subcapital fracture through the left femoral neck, with minimal comminution. The maximal head remains seated at the acetabulum. A trace associated joint effusion is noted. No additional fractures are seen. The pubic symphysis is unremarkable in appearance. Mild overlying soft tissue injury is noted at the left hip, without significant soft tissue hematoma. Scattered vascular calcifications are seen. The visualized portions of the bladder are unremarkable. The prostate is enlarged, measuring at least 5.3 cm in transverse dimension. IMPRESSION: 1. Minimally displaced subcapital fracture through the left femoral neck, with minimal comminution. Trace associated left hip joint effusion noted. 2. Mild overlying soft tissue injury, without significant soft tissue hematoma. 3.  Scattered vascular calcifications seen. 4. Enlarged prostate noted. Electronically Signed   By: Roanna Raider M.D.   On: 05/18/2015 18:21   Dg Chest Port 1 View  06/13/2015  CLINICAL DATA:  Altered mental status with fever and tachycardia. EXAM: PORTABLE CHEST 1 VIEW COMPARISON:  05/18/2015 FINDINGS: Multi lead left-sided pacemaker remains in place. Patient is post median sternotomy. Development of opacity at the left lung base obscuring the left hemidiaphragm. Development of opacity in the medial right lung base. Cardiomegaly and mediastinal contours are unchanged. Question vascular congestion, no overt edema. No pneumothorax. Osseous structures are unchanged. IMPRESSION: Bibasilar opacities, left greater than right. This may reflect atelectasis, multifocal pneumonia in the setting of fever, or aspiration. Possible left pleural effusion. Electronically Signed   By: Rubye Oaks M.D.   On: 06/13/2015 19:25   Dg Abd 2 Views  06/13/2015  CLINICAL DATA:   Nausea and vomiting. EXAM: ABDOMEN - 2 VIEW COMPARISON:  Chest radiographs earlier this day. FINDINGS: Bibasilar airspace opacities. No dilated small bowel loops. No bowel obstruction. No free intra-abdominal air. Small volume of stool throughout the colon, with stool ball distending the rectum. Atherosclerosis noted throughout the abdominal vasculature. Three partially cannulated screws traverse the left hip. IMPRESSION: Nonobstructive bowel gas pattern, no free air. Small stool burden, with stool ball distending the rectum. Electronically Signed   By: Rubye Oaks M.D.   On: 06/13/2015 21:55   Dg Hip Port Unilat With Pelvis 1v Left  05/19/2015  CLINICAL DATA:  ORIF traumatic subcapital left femoral neck fracture. EXAM: DG HIP (WITH OR WITHOUT PELVIS) 1V PORT LEFT COMPARISON:  Preoperative x-rays and CT yesterday. FINDINGS: ORIF of the subcapital left femoral neck fracture with 3 cannulated screws. Alignment appears anatomic. No acute complicating features. Moderate symmetric joint space narrowing in both hips again noted. IMPRESSION: Anatomic alignment post ORIF of the subcapital left femoral neck fracture without acute complicating features. Electronically Signed   By: Hulan Saas M.D.   On: 05/19/2015 13:20   Dg Hip Operative Unilat With Pelvis Left  05/19/2015  CLINICAL DATA:  Left femoral neck fracture, cannulated screw fixation. EXAM: OPERATIVE left HIP (WITH PELVIS IF PERFORMED) 3 VIEWS TECHNIQUE: Fluoroscopic spot image(s) were submitted for interpretation post-operatively. COMPARISON:  05/18/2015 FINDINGS: Three intraoperative spot images of the left hip demonstrate 3 cannulated screws extending axially through the hip, with threaded portions believed to be just distal to the subcapital fracture. IMPRESSION: 1. Cannulated screw fixation of subcapital hip fracture, without complicating feature identified. Electronically Signed   By: Gaylyn Rong M.D.   On: 05/19/2015 15:39   Dg Hip  Unilat With Pelvis 2-3 Views Left  05/18/2015  CLINICAL DATA:  Larey Seat this morning with left hip pain EXAM: DG HIP (WITH OR WITHOUT PELVIS) 2-3V LEFT COMPARISON:  None. FINDINGS: Moderate bilateral hip arthritis. Pelvic bones appear to be intact. Although fracture line is not identified there is foreshortening of the left proximal femur with cortical irregularity involving the lateral femoral neck. IMPRESSION: A subtle left femoral neck fracture is suspected. CT left hip would be helpful to better evaluate anatomy. Electronically Signed   By: Esperanza Heir M.D.   On: 05/18/2015 15:50   CXR 12/13 images reviewed 06/16/2015     ASSESSMENT/PLAN   81 yo white male with acute submassive hemoptysis from acute left lung pneumonia with elevated INR, patient may have underlying COPD  1.continue Iv abx as prescribed 2.tessolon perles as needed 3.will start dulera and spiriva 4.no indication for systemic steroids  or bronch at this time   I have personally obtained a history, examined the patient, evaluated laboratory and independently reviewed imaging results, formulated the assessment and plan and placed orders.  The Patient requires high complexity decision making for assessment and support, frequent evaluation and titration of therapies, application of advanced monitoring technologies and extensive interpretation of multiple databases. Patient/Family are satisfied with Plan of action and management. All questions answered  Lucie Leather, M.D.  [facility]  Medical Director [facility] Medical Director [facility] Cardio-Pulmonary Department

## 2015-06-16 NOTE — Progress Notes (Signed)
Dr. Juliene PinaMody stated patient may come off isolation to rule out cdiff.

## 2015-06-16 NOTE — Progress Notes (Signed)
*  PRELIMINARY RESULTS* Echocardiogram 2D Echocardiogram has been performed.  Riley HousekeeperJerry R Sanchez 06/16/2015, 8:57 AM

## 2015-06-16 NOTE — Progress Notes (Addendum)
Pt has been on non rebreather for hours. Continued increased productive cough. Will  Try to wean down oxygen . Had run of svt last noc.  Dr mody notifed to check pt condition early.

## 2015-06-16 NOTE — Evaluation (Signed)
Physical Therapy Evaluation Patient Details Name: Riley Sanchez. MRN: [id number] DOB: Mar 02, 1930 Today's Date: 06/16/2015   History of Present Illness  Pt is an 81 y.o. male presenting from STR with nausea, vomiting, coughing and in ED found to be hypoxic, tachypneic, and febrile to 103.  Pt admitted with sepsis d/t B basilar PNA; acute hypoxic respiratory failure.  Pt also with hematuria and hemoptysis.  CT of head negative.  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  Of note, pt s/p mechanical fall suffering L femoral neck hip fx and s/p 05/19/15 percutaneous cannulated screw fixation of L femoral neck hip fx (pt NWB'ing L LE).  PMH includes LE neuropathy, paroxysmal a-fib on warfarin, chronic systolic CHF, biventricular ICD.  Clinical Impression  Prior to admission, pt was at a STR facility receiving therapy (pt s/p ORIF 05/19/15 for impacted L femoral neck fx d/t fall).  Pt was independent prior to falling in November and lives with his wife.  Nursing recommending bed level ex's only today d/t pt just coming off non-rebreather; pt noted to cough up some blood after finishing some LE ex's in bed (nursing notified immediately).  Pt would benefit from skilled PT to address noted impairments and functional limitations.  Recommend pt discharge back to STR when medically appropriate.     Follow Up Recommendations SNF    Equipment Recommendations       Recommendations for Other Services       Precautions / Restrictions Precautions Precautions: Fall Precaution Comments: Aspiration Restrictions Weight Bearing Restrictions: Yes LLE Weight Bearing: Non weight bearing      Mobility  Bed Mobility               General bed mobility comments: Deferred (nursing recommending in bed ex's only d/t pt just came off non-rebreather)  Transfers                    Ambulation/Gait                Stairs            Wheelchair Mobility    Modified Rankin (Stroke Patients  Only)       Balance                                             Pertinent Vitals/Pain Pain Assessment: 0-10 Pain Score: 2  Pain Location: L hip Pain Descriptors / Indicators: Sore Pain Intervention(s): Limited activity within patient's tolerance;Monitored during session;Repositioned  Vitals stable and WFL throughout treatment session on 3 L/min via nasal cannula.    Home Living Family/patient expects to be discharged to:: Skilled nursing facility Living Arrangements: Spouse/significant other   Type of Home: House Home Access: Stairs to enter   [employer] of Steps: 5 with L rail Home Layout: One level   Additional Comments: Owns RW    Prior Function Level of Independence: Independent         Comments: Pt independent prior to fall in November; pt has been at rehab since discharge from hospital (s/p surgery).  Pt reports h/o balance issues d/t LE neuropathy.  Pt reports working on standing, transfers, short ambulation distance (about 5 feet NWB'ing with walker), and w/c mobility at STR.     Hand Dominance        Extremity/Trunk Assessment   Upper Extremity Assessment: Generalized weakness  Lower Extremity Assessment: RLE deficits/detail;LLE deficits/detail RLE Deficits / Details: generalized weakness R LE LLE Deficits / Details: L hip flexion at least 3-/5 with AROM; L knee flexion/extension at least 3-/5 with AROM; L DF at least 3-/5 with AROM; L DF ROM to neutral     Communication   Communication: No difficulties  Cognition Arousal/Alertness: Awake/alert Behavior During Therapy: WFL for tasks assessed/performed Overall Cognitive Status: Within Functional Limits for tasks assessed                      General Comments General comments (skin integrity, edema, etc.): R foot/ankle dressing in place (per notes pt with R heel ulcer)  Nursing cleared pt for participation in physical therapy (bed level ex's only).   Pt agreeable to PT session.  Pt's wife present during session.     Exercises   Performed semi-supine B LE therapeutic exercise x 10 reps:  Ankle pumps (AROM B LE's); quad sets x3 second holds (AROM B LE's); SAQ's (AROM R; AAROM L); heelslides (AROM R; AAROM L), hip abd/adduction (AROM R; AAROM L).  Pt required vc's and tactile cues for correct technique with exercises.       Assessment/Plan    PT Assessment Patient needs continued PT services  PT Diagnosis Generalized weakness;Difficulty walking   PT Problem List Decreased strength;Decreased range of motion;Decreased activity tolerance;Decreased balance;Decreased mobility;Pain  PT Treatment Interventions DME instruction;Gait training;Functional mobility training;Therapeutic activities;Therapeutic exercise;Balance training;Patient/family education;Wheelchair mobility training   PT Goals (Current goals can be found in the Care Plan section) Acute Rehab PT Goals Patient Stated Goal: to go back to rehab at [facility] PT Goal Formulation: With patient/family Time For Goal Achievement: 06/30/15 Potential to Achieve Goals: Good    Frequency 7X/week   Barriers to discharge Decreased caregiver support      Co-evaluation               End of Session Equipment Utilized During Treatment: Oxygen Activity Tolerance:  (Pt appeared to tolerate bed level ex's well but pt noted to cough up some blood after finishing ex's (nursing notified immediately)) Patient left: in bed;with call bell/phone within reach;with bed alarm set;with family/visitor present Nurse Communication: Precautions (pt coughed up some blood; vitals)         Time: 1050-1110 PT Time Calculation (min) (ACUTE ONLY): 20 min   Charges:   PT Evaluation $Initial PT Evaluation Tier I: 1 Procedure PT Treatments $Therapeutic Exercise: 8-22 mins   PT G CodesHendricks Limes:        Mikeila Burgen 06/16/2015, 11:36 AM Hendricks LimesEmily Lovelace Cerveny, PT [phone]

## 2015-06-16 NOTE — Progress Notes (Signed)
RN informed Dr. Betti Cruzeddy, patient has coarse crackles breath sounds throughout lungs,.patients spo2 would not reach higher than 86% on 6 Lnasal cannula, fluids stopped, lasix given by RN.

## 2015-06-16 NOTE — Progress Notes (Signed)
Speech Therapy Note: reviewed chart notes; met w/ pt and wife then NSG. Pt and wife denied any trouble swallowing foods and thin liquids - denied any overt s/s of aspiration including coughing when drinking fluids. Noted [place] O2 needs of 2-4 liters and wbc at 9.3; noted CXR. Encouraged continued aspiration precautions w/ meals/po's. ST will f/u w/ toleration of diet while admitted. NSG agreed.

## 2015-06-17 LAB — BASIC METABOLIC PANEL
ANION GAP: 6 (ref 5–15)
BUN: 11 mg/dL (ref 6–20)
CALCIUM: 8.1 mg/dL — AB (ref 8.9–10.3)
CO2: 28 mmol/L (ref 22–32)
Chloride: 106 mmol/L (ref 101–111)
Creatinine, Ser: 0.68 mg/dL (ref 0.61–1.24)
GFR calc Af Amer: >60 mL/min (ref >60)
GLUCOSE: 118 mg/dL — AB (ref 65–99)
POTASSIUM: 2.9 mmol/L — AB (ref 3.5–5.1)
SODIUM: 140 mmol/L (ref 135–145)

## 2015-06-17 LAB — CBC
HCT: 27.9 % — ABNORMAL LOW (ref 40.0–52.0)
Hemoglobin: 9.3 g/dL — ABNORMAL LOW (ref 13.0–18.0)
MCH: 29.4 pg (ref 26.0–34.0)
MCHC: 33.3 g/dL (ref 32.0–36.0)
MCV: 88.3 fL (ref 80.0–100.0)
PLATELETS: 199 10*3/uL (ref 150–440)
RBC: 3.16 MIL/uL — AB (ref 4.40–5.90)
RDW: 14 % (ref 11.5–14.5)
WBC: 7.5 10*3/uL (ref 3.8–10.6)

## 2015-06-17 LAB — URINE CULTURE: CULTURE: NO GROWTH

## 2015-06-17 LAB — GLUCOSE, CAPILLARY
GLUCOSE-CAPILLARY: 168 mg/dL — AB (ref 65–99)
Glucose-Capillary: 128 mg/dL — ABNORMAL HIGH (ref 65–99)
Glucose-Capillary: 189 mg/dL — ABNORMAL HIGH (ref 65–99)
Glucose-Capillary: 189 mg/dL — ABNORMAL HIGH (ref 65–99)

## 2015-06-17 LAB — MAGNESIUM
MAGNESIUM: 1.5 mg/dL — AB (ref 1.7–2.4)
MAGNESIUM: 1.9 mg/dL (ref 1.7–2.4)

## 2015-06-17 LAB — PROTIME-INR
INR: 1.78
PROTHROMBIN TIME: 20.7 s — AB (ref 11.4–15.0)

## 2015-06-17 LAB — POTASSIUM: Potassium: 3.4 mmol/L — ABNORMAL LOW (ref 3.5–5.1)

## 2015-06-17 MED ORDER — POTASSIUM CHLORIDE CRYS ER 20 MEQ PO TBCR
20.0000 meq | EXTENDED_RELEASE_TABLET | Freq: Once | ORAL | Status: AC
  Administered 2015-06-17: 20 meq via ORAL
  Filled 2015-06-17: qty 1

## 2015-06-17 MED ORDER — PNEUMOCOCCAL VAC POLYVALENT 25 MCG/0.5ML IJ INJ
0.5000 mL | INJECTION | INTRAMUSCULAR | Status: AC
  Administered 2015-06-18: 0.5 mL via INTRAMUSCULAR
  Filled 2015-06-17: qty 0.5

## 2015-06-17 MED ORDER — POTASSIUM CHLORIDE CRYS ER 20 MEQ PO TBCR
40.0000 meq | EXTENDED_RELEASE_TABLET | Freq: Once | ORAL | Status: AC
  Administered 2015-06-17: 40 meq via ORAL
  Filled 2015-06-17: qty 2

## 2015-06-17 MED ORDER — MENTHOL 3 MG MT LOZG
1.0000 | LOZENGE | OROMUCOSAL | Status: DC | PRN
  Filled 2015-06-17: qty 9

## 2015-06-17 MED ORDER — MAGNESIUM SULFATE 2 GM/50ML IV SOLN
2.0000 g | Freq: Once | INTRAVENOUS | Status: AC
  Administered 2015-06-17: 2 g via INTRAVENOUS
  Filled 2015-06-17: qty 50

## 2015-06-17 MED ORDER — POTASSIUM CHLORIDE 10 MEQ/100ML IV SOLN
10.0000 meq | INTRAVENOUS | Status: DC
  Filled 2015-06-17 (×4): qty 100

## 2015-06-17 MED ORDER — WARFARIN SODIUM 5 MG PO TABS
5.0000 mg | ORAL_TABLET | Freq: Once | ORAL | Status: AC
  Administered 2015-06-17: 5 mg via ORAL
  Filled 2015-06-17: qty 1

## 2015-06-17 MED ORDER — POTASSIUM CHLORIDE CRYS ER 20 MEQ PO TBCR
20.0000 meq | EXTENDED_RELEASE_TABLET | ORAL | Status: AC
  Administered 2015-06-17 (×2): 20 meq via ORAL
  Filled 2015-06-17 (×2): qty 1

## 2015-06-17 NOTE — Progress Notes (Signed)
[facility] CLINIC INFECTIOUS DISEASE PROGRESS NOTE Date of Admission:  06/13/2015     ID: Riley Sanchez. is a 81 y.o. male with enterococcal bacteremia and aspiration pna  Active Problems:   Sepsis (HCC)   Gross hematuria   Pressure ulcer  Subjective: Temp 100.8 on 12/13. Wbc down to 7.5 Still feels very weak but no nvd. Cough still present. Not getting oob  ROS  Eleven systems are reviewed and negative except per hpi  Medications:  Antibiotics Given (last 72 hours)    Date/Time Action Medication Dose Rate   06/14/15 2046 Given   piperacillin-tazobactam (ZOSYN) IVPB 3.375 g 3.375 g 12.5 mL/hr   06/14/15 2357 Given   vancomycin (VANCOCIN) IVPB 750 mg/150 ml premix 750 mg 150 mL/hr   06/15/15 0348 Given   piperacillin-tazobactam (ZOSYN) IVPB 3.375 g 3.375 g 12.5 mL/hr   06/15/15 1147 Given   piperacillin-tazobactam (ZOSYN) IVPB 3.375 g 3.375 g 12.5 mL/hr   06/15/15 1841 Given   vancomycin (VANCOCIN) IVPB 750 mg/150 ml premix 750 mg 150 mL/hr   06/15/15 2036 Given   piperacillin-tazobactam (ZOSYN) IVPB 3.375 g 3.375 g 12.5 mL/hr   06/16/15 0251 Given   piperacillin-tazobactam (ZOSYN) IVPB 3.375 g 3.375 g 12.5 mL/hr   06/16/15 1209 Given   piperacillin-tazobactam (ZOSYN) IVPB 3.375 g 3.375 g 12.5 mL/hr   06/16/15 1643 Given   Ampicillin-Sulbactam (UNASYN) 3 g in sodium chloride 0.9 % 100 mL IVPB 3 g 100 mL/hr   06/16/15 2146 Given   Ampicillin-Sulbactam (UNASYN) 3 g in sodium chloride 0.9 % 100 mL IVPB 3 g 100 mL/hr   06/17/15 0228 Given   Ampicillin-Sulbactam (UNASYN) 3 g in sodium chloride 0.9 % 100 mL IVPB 3 g 100 mL/hr   06/17/15 1008 Given   Ampicillin-Sulbactam (UNASYN) 3 g in sodium chloride 0.9 % 100 mL IVPB 3 g 100 mL/hr     . ampicillin-sulbactam (UNASYN) IV  3 g Intravenous Q6H  . feeding supplement (ENSURE ENLIVE)  237 mL Oral TID WC  . ferrous sulfate  325 mg Oral Q breakfast  . furosemide  20 mg Intravenous Daily  . gabapentin  100 mg Oral QHS  .  Influenza vac split quadrivalent PF  0.5 mL Intramuscular Tomorrow-1000  . insulin aspart  0-9 Units Subcutaneous TID WC  . magic mouthwash  10 mL Oral QID  . mometasone-formoterol  2 puff Inhalation BID  . pantoprazole  40 mg Oral Daily  . [START ON 06/18/2015] pneumococcal 23 valent vaccine  0.5 mL Intramuscular Tomorrow-1000  . potassium chloride  20 mEq Oral Q3H  . pravastatin  10 mg Oral q1800  . promethazine  12.5 mg Intravenous Once  . sodium chloride  3 mL Intravenous Q12H  . tamsulosin  0.4 mg Oral BID  . tiotropium  18 mcg Inhalation Daily  . warfarin  5 mg Oral ONCE-1800  . Warfarin - Pharmacist Dosing Inpatient   Does not apply q1800    Objective: Vital signs in last 24 hours: Temp:  [98.1 F (36.7 C)-100.8 F (38.2 C)] 98.6 F (37 C) (12/14 0739) Pulse Rate:  [63-82] 71 (12/14 0739) Resp:  [16-20] 18 (12/14 0739) BP: (132-146)/(50-88) 142/88 mmHg (12/14 0739) SpO2:  [93 %-98 %] 93 % (12/14 0739) Constitutional: He is oriented to person, place, and time. Thin, frail, very weak HENT: perrla anicteric Mouth/Throat: Oropharynx is clear and moist. No oropharyngeal exudate.  Cardiovascular: Normal rate, regular rhythm and normal heart sounds.  Pulmonary/Chest:bilat rhonchi Abdominal: Soft. Bowel sounds are  normal. He exhibits no distension. There is no tenderness.  Lymphadenopathy: He has no cervical adenopathy.  Neurological: He is alert and oriented to person, place, and time.  Skin: Skin is warm and dry. No rash noted. No erythema.  Psychiatric: He has a normal mood and affect. His behavior is normal.  Lab Results  Recent Labs  06/16/15 0635 06/17/15 0555  WBC 9.3 7.5  HGB 9.3* 9.3*  HCT 27.4* 27.9*  NA 141 140  K 3.2* 2.9*  CL 109 106  CO2 25 28  BUN 13 11  CREATININE 0.80 0.68    Microbiology: Results for orders placed or performed during the hospital encounter of 06/13/15  Blood Culture (routine x 2)     Status: None (Preliminary result)    Collection Time: 06/13/15  7:40 PM  Result Value Ref Range Status   Specimen Description BLOOD LEFT ASSIST CONTROL  Final   Special Requests BOTTLES DRAWN AEROBIC AND ANAEROBIC 4CC  Final   Culture  Setup Time   Final    GRAM POSITIVE COCCI ANAEROBIC BOTTLE ONLY CRITICAL RESULT CALLED TO, READ BACK BY AND VERIFIED WITH: CHERYL SMITH,RN 06/14/2015 1405 BY JRS.    Culture   Final    ENTEROCOCCUS GALLINARUM ANAEROBIC BOTTLE ONLY Results Called to: APRIL RAST AT 0755 06/16/15 DV    Report Status PENDING  Incomplete   Organism ID, Bacteria ENTEROCOCCUS GALLINARUM  Final      Susceptibility   Enterococcus gallinarum - MIC*    AMPICILLIN <=2 SENSITIVE Sensitive     LINEZOLID Value in next row Sensitive      SENSITIVE2    VANCOMYCIN Value in next row Resistant      RESISTANT<=0.5    GENTAMICIN SYNERGY Value in next row Sensitive      RESISTANT<=0.5    * ENTEROCOCCUS GALLINARUM  Blood Culture (routine x 2)     Status: None (Preliminary result)   Collection Time: 06/13/15  7:40 PM  Result Value Ref Range Status   Specimen Description BLOOD LEFT HAND  Final   Special Requests BOTTLES DRAWN AEROBIC AND ANAEROBIC 4CC  Final   Culture NO GROWTH 3 DAYS  Final   Report Status PENDING  Incomplete  Urine culture     Status: None   Collection Time: 06/13/15  7:41 PM  Result Value Ref Range Status   Specimen Description URINE, RANDOM  Final   Special Requests NONE  Final   Culture NO GROWTH 2 DAYS  Final   Report Status 06/15/2015 FINAL  Final  MRSA PCR Screening     Status: None   Collection Time: 06/13/15 11:48 PM  Result Value Ref Range Status   MRSA by PCR NEGATIVE NEGATIVE Final    Comment:        The GeneXpert MRSA Assay (FDA approved for NASAL specimens only), is one component of a comprehensive MRSA colonization surveillance program. It is not intended to diagnose MRSA infection nor to guide or monitor treatment for MRSA infections.   Culture, blood (routine x 2)      Status: None (Preliminary result)   Collection Time: 06/15/15 12:10 PM  Result Value Ref Range Status   Specimen Description BLOOD RIGHT ASSIST CONTROL  Final   Special Requests BOTTLES DRAWN AEROBIC AND ANAEROBIC  3CC  Final   Culture NO GROWTH < 24 HOURS  Final   Report Status PENDING  Incomplete  Culture, blood (routine x 2)     Status: None (Preliminary result)   Collection Time:  06/15/15 12:10 PM  Result Value Ref Range Status   Specimen Description BLOOD RIGHT HAND  Final   Special Requests BOTTLES DRAWN AEROBIC AND ANAEROBIC  5CC  Final   Culture NO GROWTH < 24 HOURS  Final   Report Status PENDING  Incomplete  Urine culture     Status: None   Collection Time: 06/15/15  7:00 PM  Result Value Ref Range Status   Specimen Description URINE, CATHETERIZED  Final   Special Requests NONE  Final   Culture NO GROWTH 1 DAY  Final   Report Status 06/17/2015 FINAL  Final  C difficile quick scan w PCR reflex     Status: None   Collection Time: 06/15/15  7:00 PM  Result Value Ref Range Status   C Diff antigen NEGATIVE NEGATIVE Final   C Diff toxin NEGATIVE NEGATIVE Final   C Diff interpretation Negative for C. difficile  Final  Culture, expectorated sputum-assessment     Status: None   Collection Time: 06/16/15 10:15 AM  Result Value Ref Range Status   Specimen Description SPUTUM  Final   Special Requests Normal  Final   Sputum evaluation THIS SPECIMEN IS ACCEPTABLE FOR SPUTUM CULTURE  Final   Report Status 06/16/2015 FINAL  Final  Culture, respiratory (NON-Expectorated)     Status: None (Preliminary result)   Collection Time: 06/16/15 10:15 AM  Result Value Ref Range Status   Specimen Description SPUTUM  Final   Special Requests Normal Reflexed from [id number]  Final   Gram Stain   Final    FEW WBC SEEN NO ORGANISMS SEEN EXCELLENT SPECIMEN - 90-100% WBCS    Culture Consistent with normal respiratory flora.  Final   Report Status PENDING  Incomplete     Studies/Results: Dg Chest  1 View  06/16/2015  CLINICAL DATA:  Acute onset of shortness of breath and productive cough. Recent internal fixation of left femoral fracture. Initial encounter. EXAM: CHEST 1 VIEW COMPARISON:  Chest radiograph performed 06/15/2015 FINDINGS: Left basilar airspace opacification raises concern for pneumonia. Mild vascular congestion is noted. Mild right basilar airspace opacity is also seen. No definite pleural effusion or pneumothorax identified. The cardiomediastinal silhouette is mildly enlarged. The patient is status post median sternotomy, with evidence of prior CABG. A pacemaker/AICD is noted overlying the left chest wall, with leads ending overlying the right atrium and right ventricle. No acute osseous abnormalities are identified. IMPRESSION: 1. Left basilar airspace opacification raises concern for pneumonia. Mild right basilar airspace opacity also noted. This is similar in appearance to the prior study. 2. Mild vascular congestion and mild cardiomegaly noted. Electronically Signed   By: Roanna Raider M.D.   On: 06/16/2015 06:59    Assessment/Plan: Ravinder Lukehart. is a 81 y.o. male admitted with nv while at rehab and then aspiration pna and found to have bacteremia with enterococcus gallinarum. UCX neg. Fu bcx 12/12 negative. Clinically improving with no fevers, wbc down. It is unclear what the initial source of NV was but it has improved. LFTs were nml. KUB negative. ECHO TTE negative for vegetations. His sputum cx is nml flora Sats 93% on 4L  Recommendations Cont  unasyn. Can transition to oral augmentin when stable for dc to complete a 10-14 day total abx course.  Stop date 12/26.  I have ordered incentive spirometer.  Thank you very much for the consult. Will follow with you.  Josede Cicero   06/17/2015, 2:49 PM

## 2015-06-17 NOTE — Progress Notes (Signed)
PT Cancellation Note  Patient Details Name: Riley BimlerMilton Peets Jr. MRN: [id number] DOB: April 29, 1930   Cancelled Treatment:    Reason Eval/Treat Not Completed: Medical issues which prohibited therapy. Spoke with nursing, hold PT today due to low potassium levels. Plan to see pt tomorrow once corrected.    Elsie StainHeidi Elizabeth Sanchez 06/17/2015, 10:02 AM

## 2015-06-17 NOTE — Consult Note (Signed)
MEDICATION RELATED CONSULT NOTE - follow up  Pharmacy Consult for electrolytes Indication: hpokalemia/hypomagnesium  No Known Allergies  Patient Measurements: Height:  (170.2 cm) Weight: 173 lb 6 oz (78.642 kg) IBW/kg (Calculated) : 66.1 Adjusted Body Weight:   Vital Signs: Temp: 100.2 F (37.9 C) (12/14 1617) Temp Source: Oral (12/14 1617) BP: 134/47 mmHg (12/14 1617) Pulse Rate: 71 (12/14 1617) Intake/Output from previous day: 12/13 0701 - 12/14 0700 In: 243 [P.O.:240; I.V.:3] Out: 2725 [Urine:2725] Intake/Output from this shift:    Labs:  Recent Labs  06/15/15 0507 06/15/15 1922 06/16/15 0635 06/17/15 0555 06/17/15 1756  WBC 10.6  --  9.3 7.5  --   HGB 9.8* 8.8* 9.3* 9.3*  --   HCT 29.7* 26.0* 27.4* 27.9*  --   PLT 173  --  192 199  --   CREATININE 0.89  --  0.80 0.68  --   MG  --   --  1.4* 1.5* 1.9  12/14  1756  Potassium= 3.4  Estimated Creatinine Clearance: 65.4 mL/min (by C-G formula based on Cr of 0.68).   Microbiology: Recent Results (from the past 720 hour(s))  Blood Culture (routine x 2)     Status: None (Preliminary result)   Collection Time: 06/13/15  7:40 PM  Result Value Ref Range Status   Specimen Description BLOOD LEFT ASSIST CONTROL  Final   Special Requests BOTTLES DRAWN AEROBIC AND ANAEROBIC 4CC  Final   Culture  Setup Time   Final    GRAM POSITIVE COCCI ANAEROBIC BOTTLE ONLY CRITICAL RESULT CALLED TO, READ BACK BY AND VERIFIED WITH: CHERYL SMITH,RN 06/14/2015 1405 BY JRS.    Culture   Final    ENTEROCOCCUS GALLINARUM ANAEROBIC BOTTLE ONLY Results Called to: APRIL RAST AT 0755 06/16/15 DV    Report Status PENDING  Incomplete   Organism ID, Bacteria ENTEROCOCCUS GALLINARUM  Final      Susceptibility   Enterococcus gallinarum - MIC*    AMPICILLIN <=2 SENSITIVE Sensitive     LINEZOLID Value in next row Sensitive      SENSITIVE2    VANCOMYCIN Value in next row Resistant      RESISTANT<=0.5    GENTAMICIN SYNERGY Value in  next row Sensitive      RESISTANT<=0.5    * ENTEROCOCCUS GALLINARUM  Blood Culture (routine x 2)     Status: None (Preliminary result)   Collection Time: 06/13/15  7:40 PM  Result Value Ref Range Status   Specimen Description BLOOD LEFT HAND  Final   Special Requests BOTTLES DRAWN AEROBIC AND ANAEROBIC 4CC  Final   Culture NO GROWTH 3 DAYS  Final   Report Status PENDING  Incomplete  Urine culture     Status: None   Collection Time: 06/13/15  7:41 PM  Result Value Ref Range Status   Specimen Description URINE, RANDOM  Final   Special Requests NONE  Final   Culture NO GROWTH 2 DAYS  Final   Report Status 06/15/2015 FINAL  Final  MRSA PCR Screening     Status: None   Collection Time: 06/13/15 11:48 PM  Result Value Ref Range Status   MRSA by PCR NEGATIVE NEGATIVE Final    Comment:        The GeneXpert MRSA Assay (FDA approved for NASAL specimens only), is one component of a comprehensive MRSA colonization surveillance program. It is not intended to diagnose MRSA infection nor to guide or monitor treatment for MRSA infections.   Culture, blood (routine x  2)     Status: None (Preliminary result)   Collection Time: 06/15/15 12:10 PM  Result Value Ref Range Status   Specimen Description BLOOD RIGHT ASSIST CONTROL  Final   Special Requests BOTTLES DRAWN AEROBIC AND ANAEROBIC  3CC  Final   Culture NO GROWTH < 24 HOURS  Final   Report Status PENDING  Incomplete  Culture, blood (routine x 2)     Status: None (Preliminary result)   Collection Time: 06/15/15 12:10 PM  Result Value Ref Range Status   Specimen Description BLOOD RIGHT HAND  Final   Special Requests BOTTLES DRAWN AEROBIC AND ANAEROBIC  5CC  Final   Culture NO GROWTH < 24 HOURS  Final   Report Status PENDING  Incomplete  Urine culture     Status: None   Collection Time: 06/15/15  7:00 PM  Result Value Ref Range Status   Specimen Description URINE, CATHETERIZED  Final   Special Requests NONE  Final   Culture NO GROWTH  1 DAY  Final   Report Status 06/17/2015 FINAL  Final  C difficile quick scan w PCR reflex     Status: None   Collection Time: 06/15/15  7:00 PM  Result Value Ref Range Status   C Diff antigen NEGATIVE NEGATIVE Final   C Diff toxin NEGATIVE NEGATIVE Final   C Diff interpretation Negative for C. difficile  Final  Culture, expectorated sputum-assessment     Status: None   Collection Time: 06/16/15 10:15 AM  Result Value Ref Range Status   Specimen Description SPUTUM  Final   Special Requests Normal  Final   Sputum evaluation THIS SPECIMEN IS ACCEPTABLE FOR SPUTUM CULTURE  Final   Report Status 06/16/2015 FINAL  Final  Culture, respiratory (NON-Expectorated)     Status: None (Preliminary result)   Collection Time: 06/16/15 10:15 AM  Result Value Ref Range Status   Specimen Description SPUTUM  Final   Special Requests Normal Reflexed from [id number]  Final   Gram Stain   Final    FEW WBC SEEN NO ORGANISMS SEEN EXCELLENT SPECIMEN - 90-100% WBCS    Culture Consistent with normal respiratory flora.  Final   Report Status PENDING  Incomplete    Medical History: Past Medical History  Diagnosis Date  . Diabetes mellitus without complication (HCC)   . Hypertension   . A-fib (HCC)   . GERD (gastroesophageal reflux disease)   . CHF (congestive heart failure) (HCC)   . Fracture, femur (HCC)     Medications:  Scheduled:  . ampicillin-sulbactam (UNASYN) IV  3 g Intravenous Q6H  . feeding supplement (ENSURE ENLIVE)  237 mL Oral TID WC  . ferrous sulfate  325 mg Oral Q breakfast  . furosemide  20 mg Intravenous Daily  . gabapentin  100 mg Oral QHS  . Influenza vac split quadrivalent PF  0.5 mL Intramuscular Tomorrow-1000  . insulin aspart  0-9 Units Subcutaneous TID WC  . magic mouthwash  10 mL Oral QID  . mometasone-formoterol  2 puff Inhalation BID  . pantoprazole  40 mg Oral Daily  . [START ON 06/18/2015] pneumococcal 23 valent vaccine  0.5 mL Intramuscular Tomorrow-1000  . potassium  chloride  20 mEq Oral Once  . pravastatin  10 mg Oral q1800  . promethazine  12.5 mg Intravenous Once  . sodium chloride  3 mL Intravenous Q12H  . tamsulosin  0.4 mg Oral BID  . tiotropium  18 mcg Inhalation Daily  . Warfarin - Pharmacist Dosing Inpatient  Does not apply q1800    Assessment: Pt is a 81 year old male being treated for sepsis due to aspiration PNA and bacteremia. Pt has received 2 doses of lasix  IV yesterday. Pt had also been on zosyn for 5 days which is known to cause electrolytes disturbances. Pt now on Unasyn. K=2.9 Mg=1.5.   Goal of Therapy:  K=3.5-5 Mg=1.7-2.4  Plan:  Give Mg 2g IV once and KCL IV x 4 plus 40 MEQ po once. Will recheck K and Mg . Patient may need K supplementation daily if continues on lasix.  12/14 1756 Mag= 1.9  K= 3.4.  Will give additional dose of Potassium PO x 1.   Bari Mantis PharmD Clinical Pharmacist 06/17/2015 8:03 PM

## 2015-06-17 NOTE — Progress Notes (Signed)
Per RN in progression rounds MD stated that patient will likely be ready for D/C tomorrow. Plan is for patient to return to [facility]. [facility] is aware of above. Clinical Social Worker (CSW) will continue to follow and assist as needed.   Jetta LoutBailey Morgan, LCSWA [phone]

## 2015-06-17 NOTE — Progress Notes (Signed)
ANTICOAGULATION CONSULT NOTE - Initial Consult  Pharmacy Consult for warfarin Indication: atrial fibrillation  No Known Allergies  Patient Measurements: Height: 5\' 7"  (170.2 cm) Weight: 173 lb 6 oz (78.642 kg) IBW/kg (Calculated) : 66.1  Vital Signs: Temp: 98.6 F (37 C) (12/14 0739) Temp Source: Oral (12/14 0739) BP: 142/88 mmHg (12/14 0739) Pulse Rate: 71 (12/14 0739)  Labs:  Recent Labs  06/15/15 0507 06/15/15 1922 06/16/15 0635 06/17/15 0555  HGB 9.8* 8.8* 9.3* 9.3*  HCT 29.7* 26.0* 27.4* 27.9*  PLT 173  --  192 199  LABPROT 48.5*  --  29.4* 20.7*  INR 5.53*  --  2.84 1.78  CREATININE 0.89  --  0.80  --     Estimated Creatinine Clearance: 65.4 mL/min (by C-G formula based on Cr of 0.8).   Medical History: Past Medical History  Diagnosis Date  . Diabetes mellitus without complication (HCC)   . Hypertension   . A-fib (HCC)   . GERD (gastroesophageal reflux disease)   . CHF (congestive heart failure) (HCC)   . Fracture, femur [facility](HCC)     Assessment: 81 yo patient need anticoagulation with warfarin. Pharmacy consulted for dosing and monitoring.  Prior to admission patient was taking warfarin 5mg  daily. Patient was supratherapeutic on admission.   12/9: INR 3.53 12/11: INR 5.04 12/12: INR 5.53 12/13: INR 2.84 12/14: INR 1.78   Goal of Therapy:  INR 2-3 Monitor platelets by anticoagulation protocol: Yes   Plan:  INR is subtherapeutic today. Pt warfarin had been held for 2 days due to supratherapeutic INR. Warfarin was resumed yesterday at 2.5mg . Will give pt 5mg  tonight. Recheck INR tomorrow AM.   Olene FlossMelissa D Bayron Dalto, Pharm.D Clinical Pharmacist    06/17/2015,8:03 AM

## 2015-06-17 NOTE — Progress Notes (Signed)
Physical Therapy Treatment Patient Details Name: Riley Sanchez. MRN: [id number] DOB: Dec 29, 1929 Today's Date: 06/17/2015    History of Present Illness Pt is an 81 y.o. male presenting from STR with nausea, vomiting, coughing and in ED found to be hypoxic, tachypneic, and febrile to 103.  Pt admitted with sepsis d/t B basilar PNA; acute hypoxic respiratory failure.  Pt also with hematuria and hemoptysis.  CT of head negative.  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  Of note, pt s/p mechanical fall suffering L femoral neck hip fx and s/p 05/19/15 percutaneous cannulated screw fixation of L femoral neck hip fx (pt NWB'ing L LE).  PMH includes LE neuropathy, paroxysmal a-fib on warfarin, chronic systolic CHF, biventricular ICD.    PT Comments    Charge nurse called; pt cleared for bed exercises and currently receiving potassium. Pt denies left hip pain; complains of severe cough. Pt participates in supine bed exercises well demonstrating active range of motion on the left. Pt given written exercises to perform several times a day. Continue PT as tolerated appropriate as determined by lab values to improve strength and overall functional mobility.   Follow Up Recommendations  SNF     Equipment Recommendations       Recommendations for Other Services       Precautions / Restrictions Precautions Precautions: Fall Precaution Comments: Aspiration Restrictions Weight Bearing Restrictions: Yes LLE Weight Bearing: Non weight bearing    Mobility  Bed Mobility               General bed mobility comments: nursing requests bed exercises. Currently receiving potassium  Transfers                    Ambulation/Gait                 Stairs            Wheelchair Mobility    Modified Rankin (Stroke Patients Only)       Balance                                    Cognition Arousal/Alertness: Awake/alert Behavior During Therapy: WFL for tasks  assessed/performed Overall Cognitive Status: Within Functional Limits for tasks assessed                      Exercises General Exercises - Lower Extremity Ankle Circles/Pumps: AROM;Both;20 reps;Supine Quad Sets: Strengthening;Both;20 reps;Supine Gluteal Sets: Strengthening;Both;20 reps;Supine Short Arc Quad: AROM;Both;20 reps;Supine Heel Slides: AROM;Both;20 reps;Supine Hip ABduction/ADduction: AAROM;Left;20 reps;Supine (AROM R) Straight Leg Raises: AAROM;Both;20 reps;Supine Other Exercises Other Exercises: adductor squeeze with pillow 20x    General Comments        Pertinent Vitals/Pain Pain Assessment: No/denies pain    Home Living                      Prior Function            PT Goals (current goals can now be found in the care plan section) Progress towards PT goals: Progressing toward goals (slowly)    Frequency  7X/week    PT Plan Current plan remains appropriate    Co-evaluation             End of Session Equipment Utilized During Treatment: Oxygen Activity Tolerance: Patient tolerated treatment well Patient left: in bed;with call bell/phone within reach;with bed alarm set;with  family/visitor present     Time: [id number] PT Time Calculation (min) (ACUTE ONLY): 20 min  Charges:  $Therapeutic Exercise: 8-22 mins                    G Codes:      Kristeen MissHeidi Elizabeth Bishop 06/17/2015, 12:14 PM

## 2015-06-17 NOTE — Consult Note (Signed)
MEDICATION RELATED CONSULT NOTE - INITIAL   Pharmacy Consult for electrolytes Indication: hpokalemia/hypomagnesium  No Known Allergies  Patient Measurements: Height: 5\' 7"  (170.2 cm) Weight: 173 lb 6 oz (78.642 kg) IBW/kg (Calculated) : 66.1 Adjusted Body Weight:   Vital Signs: Temp: 98.6 F (37 C) (12/14 0739) Temp Source: Oral (12/14 0739) BP: 142/88 mmHg (12/14 0739) Pulse Rate: 71 (12/14 0739) Intake/Output from previous day: 12/13 0701 - 12/14 0700 In: 243 [P.O.:240; I.V.:3] Out: 2725 [Urine:2725] Intake/Output from this shift:    Labs:  Recent Labs  06/15/15 0507 06/15/15 1922 06/16/15 0635 06/17/15 0555  WBC 10.6  --  9.3 7.5  HGB 9.8* 8.8* 9.3* 9.3*  HCT 29.7* 26.0* 27.4* 27.9*  PLT 173  --  192 199  CREATININE 0.89  --  0.80 0.68  MG  --   --  1.4* 1.5*   Estimated Creatinine Clearance: 65.4 mL/min (by C-G formula based on Cr of 0.68).   Microbiology: Recent Results (from the past 720 hour(s))  Urine culture     Status: None   Collection Time: 05/18/15  5:50 PM  Result Value Ref Range Status   Specimen Description URINE, CLEAN CATCH  Final   Special Requests NONE  Final   Culture NO GROWTH 2 DAYS  Final   Report Status 05/20/2015 FINAL  Final  MRSA PCR Screening     Status: None   Collection Time: 05/18/15  5:51 PM  Result Value Ref Range Status   MRSA by PCR NEGATIVE NEGATIVE Final    Comment:        The GeneXpert MRSA Assay (FDA approved for NASAL specimens only), is one component of a comprehensive MRSA colonization surveillance program. It is not intended to diagnose MRSA infection nor to guide or monitor treatment for MRSA infections.   Blood Culture (routine x 2)     Status: None (Preliminary result)   Collection Time: 06/13/15  7:40 PM  Result Value Ref Range Status   Specimen Description BLOOD LEFT ASSIST CONTROL  Final   Special Requests BOTTLES DRAWN AEROBIC AND ANAEROBIC 4CC  Final   Culture  Setup Time   Final    GRAM  POSITIVE COCCI ANAEROBIC BOTTLE ONLY CRITICAL RESULT CALLED TO, READ BACK BY AND VERIFIED WITH: CHERYL SMITH,RN 06/14/2015 1405 BY JRS.    Culture   Final    ENTEROCOCCUS GALLINARUM ANAEROBIC BOTTLE ONLY Results Called to: APRIL RAST AT 0755 06/16/15 DV    Report Status PENDING  Incomplete   Organism ID, Bacteria ENTEROCOCCUS GALLINARUM  Final      Susceptibility   Enterococcus gallinarum - MIC*    AMPICILLIN <=2 SENSITIVE Sensitive     LINEZOLID Value in next row Sensitive      SENSITIVE2    VANCOMYCIN Value in next row Resistant      RESISTANT<=0.5    GENTAMICIN SYNERGY Value in next row Sensitive      RESISTANT<=0.5    * ENTEROCOCCUS GALLINARUM  Blood Culture (routine x 2)     Status: None (Preliminary result)   Collection Time: 06/13/15  7:40 PM  Result Value Ref Range Status   Specimen Description BLOOD LEFT HAND  Final   Special Requests BOTTLES DRAWN AEROBIC AND ANAEROBIC 4CC  Final   Culture NO GROWTH 3 DAYS  Final   Report Status PENDING  Incomplete  Urine culture     Status: None   Collection Time: 06/13/15  7:41 PM  Result Value Ref Range Status   Specimen Description  URINE, RANDOM  Final   Special Requests NONE  Final   Culture NO GROWTH 2 DAYS  Final   Report Status 06/15/2015 FINAL  Final  MRSA PCR Screening     Status: None   Collection Time: 06/13/15 11:48 PM  Result Value Ref Range Status   MRSA by PCR NEGATIVE NEGATIVE Final    Comment:        The GeneXpert MRSA Assay (FDA approved for NASAL specimens only), is one component of a comprehensive MRSA colonization surveillance program. It is not intended to diagnose MRSA infection nor to guide or monitor treatment for MRSA infections.   Culture, blood (routine x 2)     Status: None (Preliminary result)   Collection Time: 06/15/15 12:10 PM  Result Value Ref Range Status   Specimen Description BLOOD RIGHT ASSIST CONTROL  Final   Special Requests BOTTLES DRAWN AEROBIC AND ANAEROBIC  3CC  Final    Culture NO GROWTH < 24 HOURS  Final   Report Status PENDING  Incomplete  Culture, blood (routine x 2)     Status: None (Preliminary result)   Collection Time: 06/15/15 12:10 PM  Result Value Ref Range Status   Specimen Description BLOOD RIGHT HAND  Final   Special Requests BOTTLES DRAWN AEROBIC AND ANAEROBIC  5CC  Final   Culture NO GROWTH < 24 HOURS  Final   Report Status PENDING  Incomplete  Urine culture     Status: None (Preliminary result)   Collection Time: 06/15/15  7:00 PM  Result Value Ref Range Status   Specimen Description URINE, CATHETERIZED  Final   Special Requests NONE  Final   Culture NO GROWTH < 24 HOURS  Final   Report Status PENDING  Incomplete  C difficile quick scan w PCR reflex     Status: None   Collection Time: 06/15/15  7:00 PM  Result Value Ref Range Status   C Diff antigen NEGATIVE NEGATIVE Final   C Diff toxin NEGATIVE NEGATIVE Final   C Diff interpretation Negative for C. difficile  Final  Culture, expectorated sputum-assessment     Status: None   Collection Time: 06/16/15 10:15 AM  Result Value Ref Range Status   Specimen Description SPUTUM  Final   Special Requests Normal  Final   Sputum evaluation THIS SPECIMEN IS ACCEPTABLE FOR SPUTUM CULTURE  Final   Report Status 06/16/2015 FINAL  Final  Culture, respiratory (NON-Expectorated)     Status: None (Preliminary result)   Collection Time: 06/16/15 10:15 AM  Result Value Ref Range Status   Specimen Description SPUTUM  Final   Special Requests Normal Reflexed from [id number]  Final   Gram Stain PENDING  Incomplete   Culture Consistent with normal respiratory flora.  Final   Report Status PENDING  Incomplete    Medical History: Past Medical History  Diagnosis Date  . Diabetes mellitus without complication (HCC)   . Hypertension   . A-fib (HCC)   . GERD (gastroesophageal reflux disease)   . CHF (congestive heart failure) (HCC)   . Fracture, femur (HCC)     Medications:  Scheduled:  .  ampicillin-sulbactam (UNASYN) IV  3 g Intravenous Q6H  . feeding supplement (ENSURE ENLIVE)  237 mL Oral TID WC  . ferrous sulfate  325 mg Oral Q breakfast  . furosemide  20 mg Intravenous Daily  . gabapentin  100 mg Oral QHS  . Influenza vac split quadrivalent PF  0.5 mL Intramuscular Tomorrow-1000  . insulin aspart  0-9  Units Subcutaneous TID WC  . magic mouthwash  10 mL Oral QID  . magnesium sulfate 1 - 4 g bolus IVPB  2 g Intravenous Once  . mometasone-formoterol  2 puff Inhalation BID  . pantoprazole  40 mg Oral Daily  . [START ON 06/18/2015] pneumococcal 23 valent vaccine  0.5 mL Intramuscular Tomorrow-1000  . potassium chloride  10 mEq Intravenous Q1 Hr x 4  . potassium chloride  40 mEq Oral Once  . pravastatin  10 mg Oral q1800  . promethazine  12.5 mg Intravenous Once  . sodium chloride  3 mL Intravenous Q12H  . tamsulosin  0.4 mg Oral BID  . tiotropium  18 mcg Inhalation Daily  . warfarin  5 mg Oral ONCE-1800  . Warfarin - Pharmacist Dosing Inpatient   Does not apply q1800    Assessment: Pt is a 81 year old male being treated for sepsis due to aspiration PNA and bacteremia. Pt has received 2 doses of lasix  IV yesterday. Pt had also been on zosyn for 5 days which is known to cause electrolytes disturbances. Pt now on Unasyn. K=2.9 Mg=1.5.   Goal of Therapy:  K=3.5-5 Mg=1.7-2.4  Plan:  Give Mg 2g IV once and KCL IV x 4 plus 40 MEQ po once. Will recheck K and Mg . Patient may need K supplementation daily if continues on lasix.  Amelita Risinger D Lavaughn Haberle 06/17/2015,8:44 AM

## 2015-06-17 NOTE — Plan of Care (Signed)
Pt having diff with "burning feeling" of anbx in IV site.  Pt is also confused and therefore RN concerned about tolerability of IV K+ which has been ordered.  Pt also has CHF and had problems with fluid overload this admission - so RN has no easy way to dilute K+ administration.  Pharm called adn asked if K+ could be admin any other effective way.  Pharm DCd IV K+ and changed to multiple K+ admin PO.

## 2015-06-17 NOTE — Plan of Care (Signed)
Lab called to report critical value: potasssium 2.9 (RN informed Dr.)

## 2015-06-17 NOTE — Progress Notes (Signed)
[facility] - [place] at [facility]   PATIENT NAME: Riley Sanchez    MR#:  [id number]  DATE OF BIRTH:  02-Feb-1930  SUBJECTIVE:   Patient still has some hemoptysis. Overall he is feeling better. REVIEW OF SYSTEMS:    Review of Systems  Constitutional: Negative for fever, chills and malaise/fatigue.  HENT: Negative for sore throat.   Eyes: Negative for blurred vision.  Respiratory: Positive for cough and hemoptysis. Negative for shortness of breath and wheezing.   Cardiovascular: Negative for chest pain, palpitations and leg swelling.  Gastrointestinal: Negative for heartburn, nausea, vomiting, abdominal pain, diarrhea and blood in stool.  Genitourinary: Negative for dysuria.  Musculoskeletal: Negative for back pain and joint pain.  Neurological: Positive for weakness. Negative for dizziness, tremors and headaches.  Endo/Heme/Allergies: Does not bruise/bleed easily.    Tolerating Diet: yes     DRUG ALLERGIES:  No Known Allergies  VITALS:  Blood pressure 142/88, pulse 71, temperature 98.6 F (37 C), temperature source Oral, resp. rate 18, height  (1.702 m), weight 78.642 kg (173 lb 6 oz), SpO2 93 %.  PHYSICAL EXAMINATION:   Physical Exam  Constitutional: He is oriented to person, place, and time and well-developed, well-nourished, and in no distress. No distress.  HENT:  Head: Normocephalic.  Eyes: No scleral icterus.  Neck: Normal range of motion. Neck supple. No JVD present. No tracheal deviation present.  Cardiovascular: Normal rate, regular rhythm and normal heart sounds.  Exam reveals no gallop and no friction rub.   No murmur heard. Pulmonary/Chest: Effort normal and breath sounds normal. No respiratory distress. He has no wheezes. He has no rales. He exhibits no tenderness.  Abdominal: Soft. Bowel sounds are normal. He exhibits no distension and no mass. There is no tenderness. There is no rebound and no guarding.  Musculoskeletal:  Normal range of motion. He exhibits no edema.  Neurological: He is alert and oriented to person, place, and time.  Skin: Skin is warm. No rash noted. No erythema.  Pressure ulcer right heel present on admission  Psychiatric: Affect and judgment normal.      LABORATORY PANEL:   CBC  Recent Labs Lab 06/17/15 0555  WBC 7.5  HGB 9.3*  HCT 27.9*  PLT 199   ------------------------------------------------------------------------------------------------------------------  Chemistries   Recent Labs Lab 06/13/15 1948  06/17/15 0555  NA 136  < > 140  K 5.1  < > 2.9*  CL 105  < > 106  CO2 23  < > 28  GLUCOSE 216*  < > 118*  BUN 19  < > 11  CREATININE 1.09  < > 0.68  CALCIUM 8.6*  < > 8.1*  MG  --   < > 1.5*  AST 13*  --   --   ALT 9*  --   --   ALKPHOS 67  --   --   BILITOT 1.3*  --   --   < > = values in this interval not displayed. ------------------------------------------------------------------------------------------------------------------  Cardiac Enzymes  Recent Labs Lab 06/13/15 1948  TROPONINI <0.03   ------------------------------------------------------------------------------------------------------------------  RADIOLOGY:  Dg Chest 1 View  06/16/2015  CLINICAL DATA:  Acute onset of shortness of breath and productive cough. Recent internal fixation of left femoral fracture. Initial encounter. EXAM: CHEST 1 VIEW COMPARISON:  Chest radiograph performed 06/15/2015 FINDINGS: Left basilar airspace opacification raises concern for pneumonia. Mild vascular congestion is noted. Mild right basilar airspace opacity is also seen. No definite pleural effusion or pneumothorax identified.  The cardiomediastinal silhouette is mildly enlarged. The patient is status post median sternotomy, with evidence of prior CABG. A pacemaker/AICD is noted overlying the left chest wall, with leads ending overlying the right atrium and right ventricle. No acute osseous abnormalities are  identified. IMPRESSION: 1. Left basilar airspace opacification raises concern for pneumonia. Mild right basilar airspace opacity also noted. This is similar in appearance to the prior study. 2. Mild vascular congestion and mild cardiomegaly noted. Electronically Signed   By: Roanna RaiderJeffery  Chang M.D.   On: 06/16/2015 06:59     ASSESSMENT AND PLAN:   81 year old male who was status post hip surgery on November 15 who presented from nursing home with sepsis and acute hypoxic respiratory failure.  1. Sepsis: This is due to bilateral basal pneumonia. Patient was on broad-spectrum antibiotics including vancomycin, Zosyn . His blood culture is growing out VRE. I have discontinued vancomycin and Zosyn. Dr. Sampson GoonFitzgerald recommends changing to Unasyn. At discharge patient will need to be transitioned to oral Augmentin. Stop date on antibiotics is December 26. 2. Pneumonia: Healthcare pneumonia and aspiration: Patient passed swallow evaluation.  Continue Unasyn for now. Change to oral Augmentin at discharge. Treat through December 26.  3. Acute hypoxic respiratory failure: This is due to a combination of pneumonia and acute on chronic combined systolic and diastolic heart failure. Echocardiogram shows Ejection fraction is 40-45% with akinesis of the apical myocardium and anteroseptal myocardium and moderate MR. Cardiology is consulted. Continue Lasix 20 mg IV daily and monitor input and output.  4. Recent hip surgery: Patient is non bearing weight on left leg. He was seen by orthopedic surgery prior to his admission.  5. Heel ulcer present on admission: As per wound care consultation Cleanse right heel with soap and water and pat gently dry. Apply calcium alginate to wound bed. Cover with 4x4 gauze and secure with kerlix and tape.Change daily. Will add Prevalon boot to right heel to offload pressure.   6. Diabetes type 2 without complication: Continue sliding scale insulin, ADA diet. Blood sugars are  controlled. 7. PAF: Continue Coumadin as per pharmacy consultation. Echocardiogram does show dilated left atrium.  8. History of ischemic cardiomyopathy EF 40-45% with CABG: Continue aspirin and lisinopril and statin.  9. BPH: Continue Flomax  10. Hematuria: This was due to supratherapeutic INR. Appreciate urology consultation. His urine does not show any evidence of hematuria at this time.  11. Hemoptysis: Patient continues to have some hemoptysis. Hematemesis is thought to be due to supratherapeutic INR along with pneumonia. His hemoglobin has remained relatively stable. There is no acute indication for blood transfusion.   12. SVT: Patient had a an episode of SVT on December 13 and now has been ventricular paced . Appreciate cardiology consultation.  13. Hypokalemia: Pharmacy is assisting with management.  Management plans discussed with the patient and he is in agreement.  CODE STATUS: FULL  TOTAL TIME TAKING CARE OF THIS PATIENT: 25 minutes.    POSSIBLE D/C 2-4 days, DEPENDING ON CLINICAL CONDITION to skilled nursing facility.  Florance Paolillo M.D on 06/17/2015 at 10:05 AM  Between 7am to 6pm - Pager - [phone] After 6pm go to [email or web address] - password [email or web address]  [facility] [place] [facility]  Office  [phone]  CC: Primary care physician; Sula RumpleVirk, Charanjit, MD  Note: This dictation was prepared with Dragon dictation along with smaller phrase technology. Any transcriptional errors that result from this process are unintentional.

## 2015-06-18 DIAGNOSIS — A419 Sepsis, unspecified organism: Secondary | ICD-10-CM | POA: Diagnosis not present

## 2015-06-18 LAB — BASIC METABOLIC PANEL
ANION GAP: 6 (ref 5–15)
BUN: 13 mg/dL (ref 6–20)
CHLORIDE: 102 mmol/L (ref 101–111)
CO2: 28 mmol/L (ref 22–32)
Calcium: 8.1 mg/dL — ABNORMAL LOW (ref 8.9–10.3)
Creatinine, Ser: 0.82 mg/dL (ref 0.61–1.24)
GFR calc non Af Amer: >60 mL/min (ref >60)
Glucose, Bld: 178 mg/dL — ABNORMAL HIGH (ref 65–99)
POTASSIUM: 3.2 mmol/L — AB (ref 3.5–5.1)
Sodium: 136 mmol/L (ref 135–145)

## 2015-06-18 LAB — GLUCOSE, CAPILLARY
GLUCOSE-CAPILLARY: 199 mg/dL — AB (ref 65–99)
GLUCOSE-CAPILLARY: 171 mg/dL — AB (ref 65–99)
Glucose-Capillary: 150 mg/dL — ABNORMAL HIGH (ref 65–99)
Glucose-Capillary: 197 mg/dL — ABNORMAL HIGH (ref 65–99)

## 2015-06-18 LAB — CULTURE, BLOOD (ROUTINE X 2): Culture: NO GROWTH

## 2015-06-18 LAB — PROTIME-INR
INR: 1.61
Prothrombin Time: 19.2 seconds — ABNORMAL HIGH (ref 11.4–15.0)

## 2015-06-18 LAB — POTASSIUM: Potassium: 3.8 mmol/L (ref 3.5–5.1)

## 2015-06-18 MED ORDER — POTASSIUM CHLORIDE CRYS ER 20 MEQ PO TBCR
40.0000 meq | EXTENDED_RELEASE_TABLET | Freq: Every day | ORAL | Status: DC
  Administered 2015-06-19: 40 meq via ORAL
  Filled 2015-06-18: qty 2

## 2015-06-18 MED ORDER — POTASSIUM CHLORIDE CRYS ER 20 MEQ PO TBCR
20.0000 meq | EXTENDED_RELEASE_TABLET | ORAL | Status: AC
  Administered 2015-06-18 (×2): 20 meq via ORAL
  Filled 2015-06-18 (×2): qty 1

## 2015-06-18 MED ORDER — FUROSEMIDE 10 MG/ML IJ SOLN
20.0000 mg | Freq: Two times a day (BID) | INTRAMUSCULAR | Status: DC
  Administered 2015-06-18 – 2015-06-19 (×3): 20 mg via INTRAVENOUS
  Filled 2015-06-18 (×3): qty 2

## 2015-06-18 MED ORDER — WARFARIN SODIUM 5 MG PO TABS
5.0000 mg | ORAL_TABLET | Freq: Every day | ORAL | Status: AC
  Administered 2015-06-18: 5 mg via ORAL
  Filled 2015-06-18: qty 1

## 2015-06-18 NOTE — Care Management Important Message (Signed)
Important Message  Patient Details  Name: Riley BimlerMilton Marsteller Jr. MRN: [id number] Date of Birth: [birth year]   Medicare Important Message Given:  Yes    Marily MemosLisa M Caroline Longie, RN 06/18/2015, 3:27 PM

## 2015-06-18 NOTE — Progress Notes (Signed)
Physical Therapy Treatment Patient Details Name: Riley Sanchez. MRN: [id number] DOB: July 11, 1929 Today's Date: 06/18/2015    History of Present Illness Pt is an 81 y.o. male presenting from STR with nausea, vomiting, coughing and in ED found to be hypoxic, tachypneic, and febrile to 103.  Pt admitted with sepsis d/t B basilar PNA; acute hypoxic respiratory failure.  Pt also with hematuria and hemoptysis.  CT of head negative.  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  Of note, pt s/p mechanical fall suffering L femoral neck hip fx and s/p 05/19/15 percutaneous cannulated screw fixation of L femoral neck hip fx (pt NWB'ing L LE).  PMH includes LE neuropathy, paroxysmal a-fib on warfarin, chronic systolic CHF, biventricular ICD.    PT Comments    Patient with improved mobility and activity tolerance this date; able to complete sit/stand (from elevated bed surface) with RW and bed/chair (scoot pivot over level surfaces), requiring mod/max assist +2 throughout.  Very eager and motivated to participate as tolerated, but fatigues quickly and requires extended rest periods with all exertional activities. Vitals stable and WFL throughout session on 4L supplemental O2. Goals updated to reflect progress with mobility this date.   Follow Up Recommendations  SNF     Equipment Recommendations       Recommendations for Other Services       Precautions / Restrictions Precautions Precautions: Fall Precaution Comments: Aspiration Restrictions Weight Bearing Restrictions: Yes LLE Weight Bearing: Non weight bearing    Mobility  Bed Mobility Overal bed mobility: Needs Assistance Bed Mobility: Supine to Sit     Supine to sit: Mod assist;+2 for physical assistance        Transfers Overall transfer level: Needs assistance Equipment used: Rolling walker (2 wheeled) Transfers: Sit to/from Stand Sit to Stand: Mod assist;Max assist;+2 physical assistance         General transfer comment: L LE  anterior to BOS, propped on therapist foot to maintain NWB L LE  Ambulation/Gait             General Gait Details: unsafe/unable   Stairs            Wheelchair Mobility    Modified Rankin (Stroke Patients Only)       Balance Overall balance assessment: Needs assistance Sitting-balance support: No upper extremity supported;Feet supported Sitting balance-Leahy Scale: Fair     Standing balance support: Bilateral upper extremity supported Standing balance-Leahy Scale: Poor                      Cognition Arousal/Alertness: Awake/alert Behavior During Therapy: WFL for tasks assessed/performed Overall Cognitive Status: Within Functional Limits for tasks assessed                      Exercises Other Exercises Other Exercises: Sit/stand from elevated bed surface with RW, mod/max assist +2; requires UE assist to complete. Other Exercises: Bed/chair transfer, scoot pivot over level surfaces, max assist +2--constant cuing/assist for forward trunk lean and movement sequencing    General Comments        Pertinent Vitals/Pain Pain Assessment: No/denies pain    Home Living                      Prior Function            PT Goals (current goals can now be found in the care plan section) Acute Rehab PT Goals Patient Stated Goal: to go back [facility]  [facility] Goal Formulation: With patient/family Time For Goal Achievement: 06/30/15 Potential to Achieve Goals: Good Progress towards PT goals: Progressing toward goals    Frequency  7X/week    PT Plan Current plan remains appropriate    Co-evaluation             End of Session Equipment Utilized During Treatment: Gait belt;Oxygen Activity Tolerance: Patient tolerated treatment well Patient left: in bed;with call bell/phone within reach;with chair alarm set     Time: [id number] PT Time Calculation (min) (ACUTE ONLY): 28 min  Charges:  $Therapeutic Exercise: 8-22  mins $Therapeutic Activity: 8-22 mins                    G Codes:      Illiana Losurdo H. Manson PasseyBrown, PT, DPT, NCS 06/18/2015, 11:13 AM [phone]

## 2015-06-18 NOTE — Progress Notes (Signed)
[facility] with Inetta Fermoina[facility] at [phone], to notify of non-emergent EMS transport.  Auth notification reference given as [id number].   Service date range good from 06/18/15 - 09/16/15.   Gap exception requested to determine if services can be considered at an in-network level.

## 2015-06-18 NOTE — Consult Note (Signed)
MEDICATION RELATED CONSULT NOTE - follow up  Pharmacy Consult for electrolytes Indication: hpokalemia/hypomagnesium  No Known Allergies  Patient Measurements: Height: 5\' 7"  (170.2 cm) Weight: 173 lb 6 oz (78.642 kg) IBW/kg (Calculated) : 66.1   Vital Signs: Temp: 96.2 F (35.7 C) (12/15 1554) Temp Source: Oral (12/15 1554) BP: 142/58 mmHg (12/15 1554) Pulse Rate: 77 (12/15 1554) Intake/Output from previous day: 12/14 0701 - 12/15 0700 In: 600 [IV Piggyback:600] Out: 2000 [Urine:2000] Intake/Output from this shift: Total I/O In: 240 [P.O.:240] Out: 1500 [Urine:1500]  Labs:  Recent Labs  06/15/15 1922 06/16/15 0635 06/17/15 0555 06/17/15 1756 06/18/15 0605  WBC  --  9.3 7.5  --   --   HGB 8.8* 9.3* 9.3*  --   --   HCT 26.0* 27.4* 27.9*  --   --   PLT  --  192 199  --   --   CREATININE  --  0.80 0.68  --  0.82  MG  --  1.4* 1.5* 1.9  --   12/14  1756  Potassium= 3.4  Estimated Creatinine Clearance: 63.8 mL/min (by C-G formula based on Cr of 0.82).   Microbiology: Recent Results (from the past 720 hour(s))  Blood Culture (routine x 2)     Status: None   Collection Time: 06/13/15  7:40 PM  Result Value Ref Range Status   Specimen Description BLOOD LEFT ASSIST CONTROL  Final   Special Requests BOTTLES DRAWN AEROBIC AND ANAEROBIC 4CC  Final   Culture  Setup Time   Final    GRAM POSITIVE COCCI ANAEROBIC BOTTLE ONLY CRITICAL RESULT CALLED TO, READ BACK BY AND VERIFIED WITH: CHERYL SMITH,RN 06/14/2015 1405 BY JRS.    Culture   Final    ENTEROCOCCUS GALLINARUM ANAEROBIC BOTTLE ONLY Results Called to: APRIL RAST AT 0755 06/16/15 DV VRE HAVE INTRINSIC RESISTANCE TO MOST COMMONLY USED ANTIBIOTICS AND THE ABILITY TO ACQUIRE RESISTANCE TO MOST AVAILABLE ANTIBIOTICS.    Report Status 06/18/2015 FINAL  Final   Organism ID, Bacteria ENTEROCOCCUS GALLINARUM  Final      Susceptibility   Enterococcus gallinarum - MIC*    AMPICILLIN <=2 SENSITIVE Sensitive    LINEZOLID Value in next row Sensitive      SENSITIVE2    VANCOMYCIN Value in next row Resistant      RESISTANT<=0.5    GENTAMICIN SYNERGY Value in next row Sensitive      RESISTANT<=0.5    * ENTEROCOCCUS GALLINARUM  Blood Culture (routine x 2)     Status: None   Collection Time: 06/13/15  7:40 PM  Result Value Ref Range Status   Specimen Description BLOOD LEFT HAND  Final   Special Requests BOTTLES DRAWN AEROBIC AND ANAEROBIC 4CC  Final   Culture NO GROWTH 5 DAYS  Final   Report Status 06/18/2015 FINAL  Final  Urine culture     Status: None   Collection Time: 06/13/15  7:41 PM  Result Value Ref Range Status   Specimen Description URINE, RANDOM  Final   Special Requests NONE  Final   Culture NO GROWTH 2 DAYS  Final   Report Status 06/15/2015 FINAL  Final  MRSA PCR Screening     Status: None   Collection Time: 06/13/15 11:48 PM  Result Value Ref Range Status   MRSA by PCR NEGATIVE NEGATIVE Final    Comment:        The GeneXpert MRSA Assay (FDA approved for NASAL specimens only), is one component of a comprehensive MRSA  colonization surveillance program. It is not intended to diagnose MRSA infection nor to guide or monitor treatment for MRSA infections.   Culture, blood (routine x 2)     Status: None (Preliminary result)   Collection Time: 06/15/15 12:10 PM  Result Value Ref Range Status   Specimen Description BLOOD RIGHT ASSIST CONTROL  Final   Special Requests BOTTLES DRAWN AEROBIC AND ANAEROBIC  3CC  Final   Culture NO GROWTH 3 DAYS  Final   Report Status PENDING  Incomplete  Culture, blood (routine x 2)     Status: None (Preliminary result)   Collection Time: 06/15/15 12:10 PM  Result Value Ref Range Status   Specimen Description BLOOD RIGHT HAND  Final   Special Requests BOTTLES DRAWN AEROBIC AND ANAEROBIC  5CC  Final   Culture NO GROWTH 3 DAYS  Final   Report Status PENDING  Incomplete  Urine culture     Status: None   Collection Time: 06/15/15  7:00 PM  Result  Value Ref Range Status   Specimen Description URINE, CATHETERIZED  Final   Special Requests NONE  Final   Culture NO GROWTH 1 DAY  Final   Report Status 06/17/2015 FINAL  Final  C difficile quick scan w PCR reflex     Status: None   Collection Time: 06/15/15  7:00 PM  Result Value Ref Range Status   C Diff antigen NEGATIVE NEGATIVE Final   C Diff toxin NEGATIVE NEGATIVE Final   C Diff interpretation Negative for C. difficile  Final  Culture, expectorated sputum-assessment     Status: None   Collection Time: 06/16/15 10:15 AM  Result Value Ref Range Status   Specimen Description SPUTUM  Final   Special Requests Normal  Final   Sputum evaluation THIS SPECIMEN IS ACCEPTABLE FOR SPUTUM CULTURE  Final   Report Status 06/16/2015 FINAL  Final  Culture, respiratory (NON-Expectorated)     Status: None (Preliminary result)   Collection Time: 06/16/15 10:15 AM  Result Value Ref Range Status   Specimen Description SPUTUM  Final   Special Requests Normal Reflexed from [id number]  Final   Gram Stain   Final    FEW WBC SEEN NO ORGANISMS SEEN EXCELLENT SPECIMEN - 90-100% WBCS    Culture MODERATE GROWTH YEAST IDENTIFICATION TO FOLLOW   Final   Report Status PENDING  Incomplete    Medical History: Past Medical History  Diagnosis Date  . Diabetes mellitus without complication (HCC)   . Hypertension   . A-fib (HCC)   . GERD (gastroesophageal reflux disease)   . CHF (congestive heart failure) (HCC)   . Fracture, femur (HCC)     Medications:  Scheduled:  . ampicillin-sulbactam (UNASYN) IV  3 g Intravenous Q6H  . feeding supplement (ENSURE ENLIVE)  237 mL Oral TID WC  . ferrous sulfate  325 mg Oral Q breakfast  . furosemide  20 mg Intravenous BID  . gabapentin  100 mg Oral QHS  . Influenza vac split quadrivalent PF  0.5 mL Intramuscular Tomorrow-1000  . insulin aspart  0-9 Units Subcutaneous TID WC  . magic mouthwash  10 mL Oral QID  . mometasone-formoterol  2 puff Inhalation BID  .  pantoprazole  40 mg Oral Daily  . [START ON 06/19/2015] potassium chloride  40 mEq Oral Daily  . pravastatin  10 mg Oral q1800  . promethazine  12.5 mg Intravenous Once  . sodium chloride  3 mL Intravenous Q12H  . tamsulosin  0.4 mg Oral BID  .  tiotropium  18 mcg Inhalation Daily  . Warfarin - Pharmacist Dosing Inpatient   Does not apply q1800    Assessment: Pt is a 81 year old male being treated for sepsis due to aspiration PNA and bacteremia. Pt has received 2 doses of lasix  IV yesterday. Pt had also been on zosyn for 5 days which is known to cause electrolytes disturbances. Pt now on Unasyn.   K: 3.2, Mag: 1.9 on 12/15 at 0600 Follow Up K at 1600: 3.8  Goal of Therapy:  K=3.5-5 Mg=1.7-2.4  Plan:  Follow up potassium level at 1600 of 3.8.  No further supplementation warranted at this time.  BMP/magnesium ordered in AM.   Clarisa Schools, PharmD Clinical Pharmacist 06/18/2015

## 2015-06-18 NOTE — Progress Notes (Signed)
[facility] [place] [facility]   PATIENT NAME: Riley CruiseMilton Sanchez    MR#:  [id number]  DATE OF BIRTH:  10/17/1929  SUBJECTIVE:   Patient still has mild hemoptysis. Overall he is feeling better. Moves from bed to chair with PT. REVIEW OF SYSTEMS:    Review of Systems  Constitutional: Negative for fever, chills and malaise/fatigue.  HENT: Negative for sore throat.   Eyes: Negative for blurred vision.  Respiratory: Positive for cough and hemoptysis. Negative for shortness of breath and wheezing.   Cardiovascular: Negative for chest pain, palpitations and leg swelling.  Gastrointestinal: Negative for heartburn, nausea, vomiting, abdominal pain, diarrhea and blood in stool.  Genitourinary: Negative for dysuria.  Musculoskeletal: Negative for back pain and joint pain.  Neurological: Positive for weakness. Negative for dizziness, tremors and headaches.  Endo/Heme/Allergies: Does not bruise/bleed easily.    Tolerating Diet: yes     DRUG ALLERGIES:  No Known Allergies  VITALS:  Blood pressure 140/65, pulse 89, temperature 97.6 F (36.4 C), temperature source Oral, resp. rate 18, height 5\' 7"  (1.702 m), weight 78.642 kg (173 lb 6 oz), SpO2 93 %.  PHYSICAL EXAMINATION:   Physical Exam  Constitutional: He is oriented to person, place, and time and well-developed, well-nourished, and in no distress. No distress.  HENT:  Head: Normocephalic.  Eyes: No scleral icterus.  Neck: Normal range of motion. Neck supple. No JVD present. No tracheal deviation present.  Cardiovascular: Normal rate, regular rhythm and normal heart sounds.  Exam reveals no gallop and no friction rub.   No murmur heard. Pulmonary/Chest: Effort normal and breath sounds normal. No respiratory distress. He has no wheezes. He has no rales. He exhibits no tenderness.  Abdominal: Soft. Bowel sounds are normal. He exhibits no distension and no mass. There is no tenderness. There is no rebound  and no guarding.  Musculoskeletal: Normal range of motion. He exhibits no edema.  Neurological: He is alert and oriented to person, place, and time.  Skin: Skin is warm. No rash noted. No erythema.  Pressure ulcer right heel present on admission  Psychiatric: Affect and judgment normal.      LABORATORY PANEL:   CBC  Recent Labs Lab 06/17/15 0555  WBC 7.5  HGB 9.3*  HCT 27.9*  PLT 199   ------------------------------------------------------------------------------------------------------------------  Chemistries   Recent Labs Lab 06/13/15 1948  06/17/15 1756 06/18/15 0605  NA 136  < >  --  136  K 5.1  < > 3.4* 3.2*  CL 105  < >  --  102  CO2 23  < >  --  28  GLUCOSE 216*  < >  --  178*  BUN 19  < >  --  13  CREATININE 1.09  < >  --  0.82  CALCIUM 8.6*  < >  --  8.1*  MG  --   < > 1.9  --   AST 13*  --   --   --   ALT 9*  --   --   --   ALKPHOS 67  --   --   --   BILITOT 1.3*  --   --   --   < > = values in this interval not displayed. ------------------------------------------------------------------------------------------------------------------  Cardiac Enzymes  Recent Labs Lab 06/13/15 1948  TROPONINI <0.03   ------------------------------------------------------------------------------------------------------------------  RADIOLOGY:  No results found.   ASSESSMENT AND PLAN:   81 year old male who was status post hip surgery on November 15  who presented from nursing home with sepsis and acute hypoxic respiratory failure.  1. Sepsis:due to bilateral basal pneumonia. Patient was on broad-spectrum antibiotics including vancomycin, Zosyn . His blood culture is growing out VRE. discontinued vancomycin and Zosyn. Dr. Sampson Goon recommends changing to Unasyn. At discharge patient will need to be transitioned to oral Augmentin. Stop date on antibiotics is December 26.  2. Pneumonia [facility] pneumonia and aspiration Patient passed swallow evaluation.   Continue Unasyn for now. Change to oral Augmentin at discharge. Treat through December 26.  3. Acute hypoxic respiratory failure: This is due to a combination of pneumonia and acute on chronic combined systolic and diastolic heart failure. Echocardiogram shows Ejection fraction is 40-45% with akinesis of the apical myocardium and anteroseptal myocardium and moderate MR. Cardiology is consulted. Continue Lasix 20 mg IV daily and monitor input and output.  4. Recent hip surgery: Patient is non bearing weight on left leg. He was seen by orthopedic surgery prior to his admission.  5. Heel ulcer present on admission: As per wound care consultation Cleanse right heel with soap and water and pat gently dry. Apply calcium alginate to wound bed. Cover with 4x4 gauze and secure with kerlix and tape.Change daily. Will add Prevalon boot to right heel to offload pressure.   6. Diabetes type 2 without complication: Continue sliding scale insulin, ADA diet. Blood sugars are controlled. 7. PAF: Continue Coumadin as per pharmacy consultation. Echocardiogram does show dilated left atrium.  8. History of ischemic cardiomyopathy EF 40-45% with CABG: Continue aspirin and lisinopril and statin.  9. BPH: Continue Flomax  10. Hematuria: This was due to supratherapeutic INR. Appreciate urology consultation. His urine does not show any evidence of hematuria at this time.  11. Hemoptysis: Patient continues to have minimal hemoptysis. Hematemesis is thought to be due to supratherapeutic INR along with pneumonia. His hemoglobin has remained relatively stable. There is no acute indication for blood transfusion or procedures.  12. SVT: Patient had a an episode of SVT on December 13 and now has been ventricular paced . Appreciate cardiology consultation.  13. Hypokalemia: Pharmacy is assisting with management.  Management plans discussed with the patient and he is in agreement.  CODE STATUS: FULL  TOTAL TIME TAKING  CARE OF THIS PATIENT: 25 minutes.   Patient had febrile 100.2 yesterday but the curve is trending down. Discharge to skilled nursing facility likely in 1-2 days.   Milagros Loll R M.D on 06/18/2015 at 12:46 PM  Between 7am to 6pm - Pager - [phone] After 6pm go to [email or web address] - password [email or web address]  [facility]  Office  [phone]  CC: Primary care physician; Sula Rumple, MD  Note: This dictation was prepared with Dragon dictation along with smaller phrase technology. Any transcriptional errors that result from this process are unintentional.

## 2015-06-18 NOTE — Care Management Note (Signed)
Case Management Note  Patient Details  Name: Riley BimlerMilton Alcalde Jr. MRN: [id number] Date of Birth: 1930/03/18  Subjective/Objective:   Chart reviewed for progression.  CS[facility]                  Action/Plan:   Expected Discharge Date:                  Expected Discharge Plan:     In-House Referral:     Discharge planning Services     Post Acute Care Choice:    Choice offered to:     DME Arranged:    DME Agency:     HH Arranged:    HH Agency:     Status of Service:     Medicare Important Message Given:  Yes Date Medicare IM Given:    Medicare IM give by:    Date Additional Medicare IM Given:    Additional Medicare Important Message give by:     If discussed at Long Length of Stay Meetings, dates discussed:    Additional Comments:  [name], RN 06/18/2015, 10:14 AM

## 2015-06-18 NOTE — Progress Notes (Signed)
Wet to dry dressing rt heel changed

## 2015-06-18 NOTE — Progress Notes (Signed)
MD gave order to discontinue telemetry 

## 2015-06-18 NOTE — Progress Notes (Signed)
Pneumonia injection given

## 2015-06-18 NOTE — Progress Notes (Signed)
Pt is total stand pivot back to bed with orderly , nurse .

## 2015-06-18 NOTE — Progress Notes (Signed)
Inpatient Diabetes Program Recommendations  AACE/ADA: New Consensus Statement on Inpatient Glycemic Control (2015)  Target Ranges:  Prepandial:   less than 140 mg/dL      Peak postprandial:   less than 180 mg/dL (1-2 hours)      Critically ill patients:  140 - 180 mg/dL   Review of Glycemic Control  Results for Alvy BimlerLANPHEAR, Maximilian JR. (MRN [id number]) as of 06/18/2015 13:57  Ref. Range 06/17/2015 07:40 06/17/2015 11:17 06/17/2015 16:18 06/17/2015 21:36 06/18/2015 11:40  Glucose-Capillary Latest Ref Range: 65-99 mg/dL 409128 (H) 811168 (H) 914189 (H) 189 (H) 199 (H)  Fasting lab glucose 178mg /dl  Diabetes history: Type 2 Outpatient Diabetes medications: Glucophage 1000mg  bid Current orders for Inpatient glycemic control: Novolog correction insulin 0-9 units tid with meals  Inpatient Diabetes Program Recommendations:  Post prandial blood sugars elevated- consider increasing Novolog correction to moderate scale 0-15 units tid.  Susette RacerJulie Clarene Curran, RN, BA, MHA, CDE Diabetes Coordinator Inpatient Diabetes Program  [phone] (Team Pager) [phone] [facility] Office) 06/18/2015 2:00 PM

## 2015-06-18 NOTE — Progress Notes (Signed)
Per MD patient will be on PO antibiotics at D/C and will not require IV Abx. [facility].   Jetta LoutBailey Morgan, LCSWA [phone]

## 2015-06-18 NOTE — Consult Note (Signed)
MEDICATION RELATED CONSULT NOTE - follow up  Pharmacy Consult for electrolytes Indication: hpokalemia/hypomagnesium  No Known Allergies  Patient Measurements: Height:  (170.2 cm) Weight: 173 lb 6 oz (78.642 kg) IBW/kg (Calculated) : 66.1 Adjusted Body Weight:   Vital Signs: Temp: 98 F (36.7 C) (12/15 0500) Temp Source: Oral (12/15 0500) BP: 139/50 mmHg (12/15 0500) Pulse Rate: 59 (12/15 0500) Intake/Output from previous day: 12/14 0701 - 12/15 0700 In: 600 [IV Piggyback:600] Out: 2000 [Urine:2000] Intake/Output from this shift:    Labs:  Recent Labs  06/15/15 1922 06/16/15 0635 06/17/15 0555 06/17/15 1756 06/18/15 0605  WBC  --  9.3 7.5  --   --   HGB 8.8* 9.3* 9.3*  --   --   HCT 26.0* 27.4* 27.9*  --   --   PLT  --  192 199  --   --   CREATININE  --  0.80 0.68  --  0.82  MG  --  1.4* 1.5* 1.9  --   12/14  1756  Potassium= 3.4  Estimated Creatinine Clearance: 63.8 mL/min (by C-G formula based on Cr of 0.82).   Microbiology: Recent Results (from the past 720 hour(s))  Blood Culture (routine x 2)     Status: None (Preliminary result)   Collection Time: 06/13/15  7:40 PM  Result Value Ref Range Status   Specimen Description BLOOD LEFT ASSIST CONTROL  Final   Special Requests BOTTLES DRAWN AEROBIC AND ANAEROBIC 4CC  Final   Culture  Setup Time   Final    GRAM POSITIVE COCCI ANAEROBIC BOTTLE ONLY CRITICAL RESULT CALLED TO, READ BACK BY AND VERIFIED WITH: CHERYL SMITH,RN 06/14/2015 1405 BY JRS.    Culture   Final    ENTEROCOCCUS GALLINARUM ANAEROBIC BOTTLE ONLY Results Called to: APRIL RAST AT 0755 06/16/15 DV    Report Status PENDING  Incomplete   Organism ID, Bacteria ENTEROCOCCUS GALLINARUM  Final      Susceptibility   Enterococcus gallinarum - MIC*    AMPICILLIN <=2 SENSITIVE Sensitive     LINEZOLID Value in next row Sensitive      SENSITIVE2    VANCOMYCIN Value in next row Resistant      RESISTANT<=0.5    GENTAMICIN SYNERGY Value in next  row Sensitive      RESISTANT<=0.5    * ENTEROCOCCUS GALLINARUM  Blood Culture (routine x 2)     Status: None (Preliminary result)   Collection Time: 06/13/15  7:40 PM  Result Value Ref Range Status   Specimen Description BLOOD LEFT HAND  Final   Special Requests BOTTLES DRAWN AEROBIC AND ANAEROBIC 4CC  Final   Culture NO GROWTH 3 DAYS  Final   Report Status PENDING  Incomplete  Urine culture     Status: None   Collection Time: 06/13/15  7:41 PM  Result Value Ref Range Status   Specimen Description URINE, RANDOM  Final   Special Requests NONE  Final   Culture NO GROWTH 2 DAYS  Final   Report Status 06/15/2015 FINAL  Final  MRSA PCR Screening     Status: None   Collection Time: 06/13/15 11:48 PM  Result Value Ref Range Status   MRSA by PCR NEGATIVE NEGATIVE Final    Comment:        The GeneXpert MRSA Assay (FDA approved for NASAL specimens only), is one component of a comprehensive MRSA colonization surveillance program. It is not intended to diagnose MRSA infection nor to guide or monitor treatment for  MRSA infections.   Culture, blood (routine x 2)     Status: None (Preliminary result)   Collection Time: 06/15/15 12:10 PM  Result Value Ref Range Status   Specimen Description BLOOD RIGHT ASSIST CONTROL  Final   Special Requests BOTTLES DRAWN AEROBIC AND ANAEROBIC  3CC  Final   Culture NO GROWTH < 24 HOURS  Final   Report Status PENDING  Incomplete  Culture, blood (routine x 2)     Status: None (Preliminary result)   Collection Time: 06/15/15 12:10 PM  Result Value Ref Range Status   Specimen Description BLOOD RIGHT HAND  Final   Special Requests BOTTLES DRAWN AEROBIC AND ANAEROBIC  5CC  Final   Culture NO GROWTH < 24 HOURS  Final   Report Status PENDING  Incomplete  Urine culture     Status: None   Collection Time: 06/15/15  7:00 PM  Result Value Ref Range Status   Specimen Description URINE, CATHETERIZED  Final   Special Requests NONE  Final   Culture NO GROWTH 1  DAY  Final   Report Status 06/17/2015 FINAL  Final  C difficile quick scan w PCR reflex     Status: None   Collection Time: 06/15/15  7:00 PM  Result Value Ref Range Status   C Diff antigen NEGATIVE NEGATIVE Final   C Diff toxin NEGATIVE NEGATIVE Final   C Diff interpretation Negative for C. difficile  Final  Culture, expectorated sputum-assessment     Status: None   Collection Time: 06/16/15 10:15 AM  Result Value Ref Range Status   Specimen Description SPUTUM  Final   Special Requests Normal  Final   Sputum evaluation THIS SPECIMEN IS ACCEPTABLE FOR SPUTUM CULTURE  Final   Report Status 06/16/2015 FINAL  Final  Culture, respiratory (NON-Expectorated)     Status: None (Preliminary result)   Collection Time: 06/16/15 10:15 AM  Result Value Ref Range Status   Specimen Description SPUTUM  Final   Special Requests Normal Reflexed from [id number]  Final   Gram Stain   Final    FEW WBC SEEN NO ORGANISMS SEEN EXCELLENT SPECIMEN - 90-100% WBCS    Culture Consistent with normal respiratory flora.  Final   Report Status PENDING  Incomplete    Medical History: Past Medical History  Diagnosis Date  . Diabetes mellitus without complication (HCC)   . Hypertension   . A-fib (HCC)   . GERD (gastroesophageal reflux disease)   . CHF (congestive heart failure) (HCC)   . Fracture, femur (HCC)     Medications:  Scheduled:  . ampicillin-sulbactam (UNASYN) IV  3 g Intravenous Q6H  . feeding supplement (ENSURE ENLIVE)  237 mL Oral TID WC  . ferrous sulfate  325 mg Oral Q breakfast  . furosemide  20 mg Intravenous Daily  . gabapentin  100 mg Oral QHS  . Influenza vac split quadrivalent PF  0.5 mL Intramuscular Tomorrow-1000  . insulin aspart  0-9 Units Subcutaneous TID WC  . magic mouthwash  10 mL Oral QID  . mometasone-formoterol  2 puff Inhalation BID  . pantoprazole  40 mg Oral Daily  . pneumococcal 23 valent vaccine  0.5 mL Intramuscular Tomorrow-1000  . pravastatin  10 mg Oral q1800  .  promethazine  12.5 mg Intravenous Once  . sodium chloride  3 mL Intravenous Q12H  . tamsulosin  0.4 mg Oral BID  . tiotropium  18 mcg Inhalation Daily  . Warfarin - Pharmacist Dosing Inpatient   Does not  apply q1800    Assessment: Pt is a 81 year old male being treated for sepsis due to aspiration PNA and bacteremia. Pt has received 2 doses of lasix  IV yesterday. Pt had also been on zosyn for 5 days which is known to cause electrolytes disturbances. Pt now on Unasyn. K=3.2 Mg=1.9 (12/14).   Goal of Therapy:  K=3.5-5 Mg=1.7-2.4  Plan:  Mag= 1.9  K= 3.2.  Will give 20 MEQ KCL PO q 2 hrs x 2. Recheck K at 1600.   Olene Floss, Pharm.D Clinical Pharmacist  06/18/2015 7:32 AM

## 2015-06-18 NOTE — Progress Notes (Signed)
ANTICOAGULATION CONSULT NOTE - Initial Consult  Pharmacy Consult for warfarin Indication: atrial fibrillation  No Known Allergies  Patient Measurements: Height: 5\' 7"  (170.2 cm) Weight: 173 lb 6 oz (78.642 kg) IBW/kg (Calculated) : 66.1  Vital Signs: Temp: 98 F (36.7 C) (12/15 0500) Temp Source: Oral (12/15 0500) BP: 139/50 mmHg (12/15 0500) Pulse Rate: 59 (12/15 0500)  Labs:  Recent Labs  06/15/15 1922 06/16/15 0635 06/17/15 0555 06/18/15 0605  HGB 8.8* 9.3* 9.3*  --   HCT 26.0* 27.4* 27.9*  --   PLT  --  192 199  --   LABPROT  --  29.4* 20.7* 19.2*  INR  --  2.84 1.78 1.61  CREATININE  --  0.80 0.68 0.82    Estimated Creatinine Clearance: 63.8 mL/min (by C-G formula based on Cr of 0.82).   Medical History: Past Medical History  Diagnosis Date  . Diabetes mellitus without complication (HCC)   . Hypertension   . A-fib (HCC)   . GERD (gastroesophageal reflux disease)   . CHF (congestive heart failure) (HCC)   . Fracture, femur [facility](HCC)     Assessment: 81 yo patient need anticoagulation with warfarin. Pharmacy consulted for dosing and monitoring.  Prior to admission patient was taking warfarin 5mg  daily. Patient was supratherapeutic on admission.   12/9: INR 3.53 12/11: INR 5.04 12/12: INR 5.53 12/13: INR 2.84 12/14: INR 1.78 12/15: INR 1.61   Goal of Therapy:  INR 2-3 Monitor platelets by anticoagulation protocol: Yes   Plan:  INR is subtherapeutic today. Pt warfarin had been held for 2 days due to supratherapeutic INR. Warfarin was resumed 2 days ago, has recieved 2.5mg  and 5mg . Will give pt 5mg  tonight. Recheck INR tomorrow AM.   Olene FlossMelissa D Maccia, Pharm.D Clinical Pharmacist    06/18/2015,7:33 AM

## 2015-06-19 DIAGNOSIS — A419 Sepsis, unspecified organism: Secondary | ICD-10-CM | POA: Diagnosis not present

## 2015-06-19 LAB — CBC WITH DIFFERENTIAL/PLATELET
BASOS ABS: 0 10*3/uL (ref 0–0.1)
BASOS PCT: 0 %
EOS PCT: 4 %
Eosinophils Absolute: 0.4 10*3/uL (ref 0–0.7)
HEMATOCRIT: 27.6 % — AB (ref 40.0–52.0)
Hemoglobin: 9.2 g/dL — ABNORMAL LOW (ref 13.0–18.0)
LYMPHS PCT: 12 %
Lymphs Abs: 1.1 10*3/uL (ref 1.0–3.6)
MCH: 29.3 pg (ref 26.0–34.0)
MCHC: 33.4 g/dL (ref 32.0–36.0)
MCV: 87.6 fL (ref 80.0–100.0)
MONO ABS: 0.7 10*3/uL (ref 0.2–1.0)
Monocytes Relative: 8 %
NEUTROS ABS: 7.5 10*3/uL — AB (ref 1.4–6.5)
Neutrophils Relative %: 76 %
PLATELETS: 234 10*3/uL (ref 150–440)
RBC: 3.15 MIL/uL — AB (ref 4.40–5.90)
RDW: 14.1 % (ref 11.5–14.5)
WBC: 9.7 10*3/uL (ref 3.8–10.6)

## 2015-06-19 LAB — MAGNESIUM: Magnesium: 1.9 mg/dL (ref 1.7–2.4)

## 2015-06-19 LAB — BASIC METABOLIC PANEL
ANION GAP: 5 (ref 5–15)
BUN: 15 mg/dL (ref 6–20)
CALCIUM: 8.4 mg/dL — AB (ref 8.9–10.3)
CO2: 30 mmol/L (ref 22–32)
Chloride: 107 mmol/L (ref 101–111)
Creatinine, Ser: 0.64 mg/dL (ref 0.61–1.24)
GFR calc Af Amer: >60 mL/min (ref >60)
GLUCOSE: 159 mg/dL — AB (ref 65–99)
POTASSIUM: 3.5 mmol/L (ref 3.5–5.1)
SODIUM: 142 mmol/L (ref 135–145)

## 2015-06-19 LAB — PROTIME-INR
INR: 1.76
PROTHROMBIN TIME: 20.5 s — AB (ref 11.4–15.0)

## 2015-06-19 LAB — GLUCOSE, CAPILLARY
Glucose-Capillary: 144 mg/dL — ABNORMAL HIGH (ref 65–99)
Glucose-Capillary: 228 mg/dL — ABNORMAL HIGH (ref 65–99)

## 2015-06-19 MED ORDER — MOMETASONE FURO-FORMOTEROL FUM 200-5 MCG/ACT IN AERO: 2.0000 | INHALATION_SPRAY | Freq: Two times a day (BID) | RESPIRATORY_TRACT | Status: DC

## 2015-06-19 MED ORDER — OXYCODONE HCL 5 MG PO TABS: 5.0000 mg | ORAL_TABLET | ORAL | Status: DC | PRN

## 2015-06-19 MED ORDER — WARFARIN SODIUM 5 MG PO TABS: 5.0000 mg | ORAL_TABLET | Freq: Every day | ORAL | Status: DC

## 2015-06-19 MED ORDER — INSULIN ASPART 100 UNIT/ML ~~LOC~~ SOLN: 0.0000 [IU] | Freq: Three times a day (TID) | SUBCUTANEOUS | Status: DC

## 2015-06-19 MED ORDER — BENZONATATE 200 MG PO CAPS: 200.0000 mg | ORAL_CAPSULE | Freq: Three times a day (TID) | ORAL | Status: DC | PRN

## 2015-06-19 MED ORDER — POTASSIUM CHLORIDE CRYS ER 20 MEQ PO TBCR: 40.0000 meq | EXTENDED_RELEASE_TABLET | Freq: Every day | ORAL | Status: DC

## 2015-06-19 MED ORDER — TIOTROPIUM BROMIDE MONOHYDRATE 18 MCG IN CAPS: 18.0000 ug | ORAL_CAPSULE | Freq: Every day | RESPIRATORY_TRACT | Status: DC

## 2015-06-19 MED ORDER — FUROSEMIDE 20 MG PO TABS: 20.0000 mg | ORAL_TABLET | Freq: Two times a day (BID) | ORAL | Status: DC

## 2015-06-19 MED ORDER — MENTHOL 3 MG MT LOZG
1.0000 | LOZENGE | OROMUCOSAL | Status: DC | PRN
Start: 2015-06-19 — End: 2015-07-29

## 2015-06-19 MED ORDER — AMOXICILLIN-POT CLAVULANATE 500-125 MG PO TABS: 1.0000 | ORAL_TABLET | Freq: Three times a day (TID) | ORAL | Status: AC

## 2015-06-19 MED ORDER — ENSURE ENLIVE PO LIQD: 237.0000 mL | Freq: Three times a day (TID) | ORAL | Status: DC

## 2015-06-19 NOTE — Discharge Summary (Signed)
[facility] - [place] [facility]   PATIENT NAME: Riley Sanchez    MR#:  [id number]  DATE OF BIRTH:  Jul 16, 1929  DATE OF ADMISSION:  06/13/2015 ADMITTING PHYSICIAN: Katharina Caper, MD  DATE OF DISCHARGE: 06/19/2015 PRIMARY CARE PHYSICIAN: Sula Rumple, MD    ADMISSION DIAGNOSIS:  Encephalopathy acute [G93.40] Nausea & vomiting [R11.2]  DISCHARGE DIAGNOSIS:  Active Problems:   Sepsis (HCC)   Gross hematuria   Pressure ulcer   SECONDARY DIAGNOSIS:   Past Medical History  Diagnosis Date  . Diabetes mellitus without complication (HCC)   . Hypertension   . A-fib (HCC)   . GERD (gastroesophageal reflux disease)   . CHF (congestive heart failure) (HCC)   . Fracture, femur [facility])     HOSPITAL COURSE:  81 year old male who was status post hip surgery on November 15 who presented from nursing home with sepsis and acute hypoxic respiratory failure.  1. Sepsis:due to bilateral basal pneumonia. Patient was on broad-spectrum antibiotics including vancomycin, Zosyn . His blood culture is with VRE. discontinued vancomycin and Zosyn. Dr. Sampson Goon recommends changing to Unasyn. At discharge patient will need to be transitioned to oral Augmentin. Stop date on antibiotics is December 26.  2. Pneumonia [facility] pneumonia and aspiration Patient passed swallow evaluation.  Continue Unasyn for now. Change to oral Augmentin at discharge. Treat through December 26.  3. Acute hypoxic respiratory failure: This is due to a combination of pneumonia and acute on chronic combined systolic and diastolic heart failure. Echocardiogram shows Ejection fraction is 40-45% with akinesis of the apical myocardium and anteroseptal myocardium and moderate MR. appreciate Cardiology recommendations Given Lasix 20 mg IV daily and monitor input and output, will change it to by mouth Lasix  4. Recent hip surgery: Patient is non bearing weight on left leg. He was seen by  orthopedic surgery prior to his admission.  5. Heel ulcer present on admission: As per wound care consultation Cleanse right heel with soap and water and pat gently dry. Apply calcium alginate to wound bed. Cover with 4x4 gauze and secure with kerlix and tape.Change daily. Will add Prevalon boot to right heel to offload pressure.   6. Diabetes type 2 without complication: Continue sliding scale insulin, ADA diet. Blood sugars are controlled. 7. PAF: Continue Coumadin as hematuria and hemoptysis are resolved. Echocardiogram does show dilated left atrium. Repeat PT/INR on 06/20/2015 and further Coumadin management per skilled nursing facility healthcare provider or  primary care physician  8. History of ischemic cardiomyopathy EF 40-45% with CABG: Continue aspirin and lisinopril and statin.  9. BPH: Continue Flomax, continue Foley catheter for a week as recommended by urology and outpatient follow-up with urologist Dr. Apolinar Junes in a week  10. Hematuria: This was due to supratherapeutic INR, which is resolved now. Appreciate urology consultation. His urine does not show any evidence of hematuria at this time. Resumed Coumadin  11. Hemoptysis: Patient continues to have minimal hemoptysis. Hematemesis is thought to be due to supratherapeutic INR along with pneumonia. His hemoglobin has remained relatively stable. There is no acute indication for blood transfusion or procedures.  12. SVT: Patient had a an episode of SVT on December 13 and now has been ventricular paced . Appreciate cardiology consultation.  13. Hypokalemia-resolved  Management plans discussed with the patient and he is in agreement.  CODE STATUS: FULL  TOTAL TIME TAKING CARE OF THIS PATIENT: \45 minutes.    Discharge to skilled nursing facility today if bed is available  DISCHARGE CONDITIONS:   fair  CONSULTS OBTAINED:  Treatment Team:  Vanna ScotlandAshley Brandon, MD Erin FullingKurian Kasa, MD Dalia HeadingKenneth A Fath, MD Clydie Braunavid Fitzgerald,  MD   PROCEDURES foley placement  DRUG ALLERGIES:  No Known Allergies  DISCHARGE MEDICATIONS:   Current Discharge Medication List    START taking these medications   Details  amoxicillin-clavulanate (AUGMENTIN) 500-125 MG tablet Take 1 tablet (500 mg total) by mouth 3 (three) times daily. Qty: 20 tablet, Refills: 0    benzonatate (TESSALON) 200 MG capsule Take 1 capsule (200 mg total) by mouth 3 (three) times daily as needed for cough. Qty: 20 capsule, Refills: 0    feeding supplement, ENSURE ENLIVE, (ENSURE ENLIVE) LIQD Take 237 mLs by mouth 3 (three) times daily with meals. Qty: 237 mL, Refills: 12    furosemide (LASIX) 20 MG tablet Take 1 tablet (20 mg total) by mouth 2 (two) times daily. Qty: 30 tablet, Refills: 0    insulin aspart (NOVOLOG) 100 UNIT/ML injection Inject 0-9 Units into the skin 3 (three) times daily with meals. Qty: 10 mL, Refills: 11    menthol-cetylpyridinium (CEPACOL) 3 MG lozenge Take 1 lozenge (3 mg total) by mouth as needed for sore throat. Qty: 100 tablet, Refills: 12    mometasone-formoterol (DULERA) 200-5 MCG/ACT AERO Inhale 2 puffs into the lungs 2 (two) times daily. Qty: 13 g, Refills: 0    potassium chloride SA (K-DUR,KLOR-CON) 20 MEQ tablet Take 2 tablets (40 mEq total) by mouth daily. Qty: 20 tablet, Refills: 0    tiotropium (SPIRIVA) 18 MCG inhalation capsule Place 1 capsule (18 mcg total) into inhaler and inhale daily. Qty: 30 capsule, Refills: 12      CONTINUE these medications which have CHANGED   Details  oxyCODONE (OXY IR/ROXICODONE) 5 MG immediate release tablet Take 1-2 tablets (5-10 mg total) by mouth every 4 (four) hours as needed for severe pain. Qty: 20 tablet, Refills: 0    warfarin (COUMADIN) 5 MG tablet Take 1 tablet (5 mg total) by mouth daily. Qty: 3 tablet, Refills: 0      CONTINUE these medications which have NOT CHANGED   Details  acetaminophen (TYLENOL) 325 MG tablet Take 650 mg by mouth every 6 (six) hours as  needed.    aspirin EC 81 MG tablet Take 81 mg by mouth daily.    cholecalciferol (VITAMIN D) 1000 UNITS tablet Take 1,000 Units by mouth daily.    diphenhydrAMINE (BENADRYL) 25 MG tablet Take 25 mg by mouth at bedtime as needed for sleep.    ferrous sulfate 325 (65 FE) MG tablet Take 325 mg by mouth daily with breakfast.    gabapentin (NEURONTIN) 100 MG capsule Take 100 mg by mouth at bedtime.    lisinopril (PRINIVIL,ZESTRIL) 10 MG tablet Take 10 mg by mouth daily.    lovastatin (MEVACOR) 40 MG tablet Take 40 mg by mouth at bedtime.    metFORMIN (GLUCOPHAGE) 1000 MG tablet Take 1,000 mg by mouth 2 (two) times daily with a meal.    omeprazole (PRILOSEC) 20 MG capsule Take 20 mg by mouth daily.    polyethylene glycol (MIRALAX / GLYCOLAX) packet Take 17 g by mouth daily as needed.    promethazine (PHENERGAN) 25 MG/ML injection Inject 25 mg into the vein once.    tamsulosin (FLOMAX) 0.4 MG CAPS capsule Take 0.4 mg by mouth 2 (two) times daily.    nitroGLYCERIN (NITROSTAT) 0.4 MG SL tablet Place 0.4 mg under the tongue every 5 (five) minutes as needed for  chest pain.      STOP taking these medications     bisacodyl (DULCOLAX) 10 MG suppository      bisacodyl (DULCOLAX) 10 MG suppository          DISCHARGE INSTRUCTIONS:   Repeat PT/INR on 06/20/2015 and further management of Coumadin by the primary care physician or skilled nursing facility provider Continue antibiotics until December 26 Follow-up with primary care physician in 2-3 days Follow-up with infectious disease Dr. Sampson Goon in a week Follow-up with urology Dr. Apolinar Junes in a week Follow-up with cardiology in a week Continue wound care on the right heel ulcer  DIET:  Cardiac diet, diabetic diet  DISCHARGE CONDITION:  Fair  ACTIVITY:  Activity as tolerated, per PT recommendations  OXYGEN:  Home Oxygen: Yes.     Oxygen Delivery: 2 liters/min via Patient connected to nasal cannula oxygen  DISCHARGE  LOCATION:  nursing home   If you experience worsening of your admission symptoms, develop shortness of breath, life threatening emergency, suicidal or homicidal thoughts you must seek medical attention immediately by calling 911 or calling your MD immediately  if symptoms less severe.  You Must read complete instructions/literature along with all the possible adverse reactions/side effects for all the Medicines you take and that have been prescribed to you. Take any new Medicines after you have completely understood and accpet all the possible adverse reactions/side effects.   Please note  You were cared for by a hospitalist during your hospital stay. If you have any questions about your discharge medications or the care you received while you were in the hospital after you are discharged, you can call the unit and asked to speak with the hospitalist on call if the hospitalist that took care of you is not available. Once you are discharged, your primary care physician will handle any further medical issues. Please note that NO REFILLS for any discharge medications will be authorized once you are discharged, as it is imperative that you return to your primary care physician (or establish a relationship with a primary care physician if you do not have one) for your aftercare needs so that they can reassess your need for medications and monitor your lab values.     Today  Chief Complaint  Patient presents with  . Fever    altered mental status, tachycardic   Pt is feeling much better. Denies any shortness of breath. Denies any coughing up blood. Cough is improving. No other episodes of hematuria  ROS:  CONSTITUTIONAL: Denies fevers, chills. Denies any fatigue, weakness.  EYES: Denies blurry vision, double vision, eye pain. EARS, NOSE, THROAT: Denies tinnitus, ear pain, hearing loss. RESPIRATORY: cough is better, wheeze, shortness of breath. Denies any other episodes of  hemoptysis CARDIOVASCULAR: Denies chest pain, palpitations, edema.  GASTROINTESTINAL: Denies nausea, vomiting, diarrhea, abdominal pain. Denies bright red blood per rectum. GENITOURINARY: Denies dysuria, hematuria. ENDOCRINE: Denies nocturia or thyroid problems. HEMATOLOGIC AND LYMPHATIC: Denies easy bruising or bleeding. SKIN: Denies rash or lesion. MUSCULOSKELETAL: Denies pain in neck, back, shoulder, knees, hips or arthritic symptoms.  NEUROLOGIC: Denies paralysis, paresthesias.  PSYCHIATRIC: Denies anxiety or depressive symptoms.   VITAL SIGNS:  Blood pressure 132/58, pulse 67, temperature 97.6 F (36.4 C), temperature source Oral, resp. rate 18, height  (1.702 m), weight 78.642 kg (173 lb 6 oz), SpO2 93 %.  I/O:    Intake/Output Summary (Last 24 hours) at 06/19/15 1140 Last data filed at 06/19/15 0752  Gross per 24 hour  Intake  680 ml  Output   2650 ml  Net  -1970 ml    PHYSICAL EXAMINATION:  GENERAL:  81 y.o.-year-old patient lying in the bed with no acute distress.  EYES: Pupils equal, round, reactive to light and accommodation. No scleral icterus. Extraocular muscles intact.  HEENT: Head atraumatic, normocephalic. Oropharynx and nasopharynx clear.  NECK:  Supple, no jugular venous distention. No thyroid enlargement, no tenderness.  LUNGS: Normal breath sounds bilaterally, no wheezing, rales,rhonchi or crepitation. No use of accessory muscles of respiration.  CARDIOVASCULAR: S1, S2 normal. No murmurs, rubs, or gallops.  ABDOMEN: Soft, non-tender, non-distended. Bowel sounds present. No organomegaly or mass.  EXTREMITIES: No pedal edema, cyanosis, or clubbing.  NEUROLOGIC: Cranial nerves II through XII are intact. Muscle strength 5/5 in all extremities. Sensation intact. Gait not checked.  PSYCHIATRIC: The patient is alert and oriented x 3.  SKIN: No obvious rash, lesion, or ulcer.   DATA REVIEW:   CBC  Recent Labs Lab 06/19/15 0516  WBC 9.7  HGB 9.2*   HCT 27.6*  PLT 234    Chemistries   Recent Labs Lab 06/13/15 1948  06/19/15 0516  NA 136  < > 142  K 5.1  < > 3.5  CL 105  < > 107  CO2 23  < > 30  GLUCOSE 216*  < > 159*  BUN 19  < > 15  CREATININE 1.09  < > 0.64  CALCIUM 8.6*  < > 8.4*  MG  --   < > 1.9  AST 13*  --   --   ALT 9*  --   --   ALKPHOS 67  --   --   BILITOT 1.3*  --   --   < > = values in this interval not displayed.  Cardiac Enzymes  Recent Labs Lab 06/13/15 1948  TROPONINI <0.03    Microbiology Results  Results for orders placed or performed during the hospital encounter of 06/13/15  Blood Culture (routine x 2)     Status: None   Collection Time: 06/13/15  7:40 PM  Result Value Ref Range Status   Specimen Description BLOOD LEFT ASSIST CONTROL  Final   Special Requests BOTTLES DRAWN AEROBIC AND ANAEROBIC 4CC  Final   Culture  Setup Time   Final    GRAM POSITIVE COCCI ANAEROBIC BOTTLE ONLY CRITICAL RESULT CALLED TO, READ BACK BY AND VERIFIED WITH: CHERYL SMITH,RN 06/14/2015 1405 BY JRS.    Culture   Final    ENTEROCOCCUS GALLINARUM ANAEROBIC BOTTLE ONLY Results Called to: APRIL RAST AT 0755 06/16/15 DV VRE HAVE INTRINSIC RESISTANCE TO MOST COMMONLY USED ANTIBIOTICS AND THE ABILITY TO ACQUIRE RESISTANCE TO MOST AVAILABLE ANTIBIOTICS.    Report Status 06/18/2015 FINAL  Final   Organism ID, Bacteria ENTEROCOCCUS GALLINARUM  Final      Susceptibility   Enterococcus gallinarum - MIC*    AMPICILLIN <=2 SENSITIVE Sensitive     LINEZOLID Value in next row Sensitive      SENSITIVE2    VANCOMYCIN Value in next row Resistant      RESISTANT<=0.5    GENTAMICIN SYNERGY Value in next row Sensitive      RESISTANT<=0.5    * ENTEROCOCCUS GALLINARUM  Blood Culture (routine x 2)     Status: None   Collection Time: 06/13/15  7:40 PM  Result Value Ref Range Status   Specimen Description BLOOD LEFT HAND  Final   Special Requests BOTTLES DRAWN AEROBIC AND ANAEROBIC 4CC  Final  Culture NO GROWTH 5 DAYS   Final   Report Status 06/18/2015 FINAL  Final  Urine culture     Status: None   Collection Time: 06/13/15  7:41 PM  Result Value Ref Range Status   Specimen Description URINE, RANDOM  Final   Special Requests NONE  Final   Culture NO GROWTH 2 DAYS  Final   Report Status 06/15/2015 FINAL  Final  MRSA PCR Screening     Status: None   Collection Time: 06/13/15 11:48 PM  Result Value Ref Range Status   MRSA by PCR NEGATIVE NEGATIVE Final    Comment:        The GeneXpert MRSA Assay (FDA approved for NASAL specimens only), is one component of a comprehensive MRSA colonization surveillance program. It is not intended to diagnose MRSA infection nor to guide or monitor treatment for MRSA infections.   Culture, blood (routine x 2)     Status: None (Preliminary result)   Collection Time: 06/15/15 12:10 PM  Result Value Ref Range Status   Specimen Description BLOOD RIGHT ASSIST CONTROL  Final   Special Requests BOTTLES DRAWN AEROBIC AND ANAEROBIC  3CC  Final   Culture NO GROWTH 3 DAYS  Final   Report Status PENDING  Incomplete  Culture, blood (routine x 2)     Status: None (Preliminary result)   Collection Time: 06/15/15 12:10 PM  Result Value Ref Range Status   Specimen Description BLOOD RIGHT HAND  Final   Special Requests BOTTLES DRAWN AEROBIC AND ANAEROBIC  5CC  Final   Culture NO GROWTH 3 DAYS  Final   Report Status PENDING  Incomplete  Urine culture     Status: None   Collection Time: 06/15/15  7:00 PM  Result Value Ref Range Status   Specimen Description URINE, CATHETERIZED  Final   Special Requests NONE  Final   Culture NO GROWTH 1 DAY  Final   Report Status 06/17/2015 FINAL  Final  C difficile quick scan w PCR reflex     Status: None   Collection Time: 06/15/15  7:00 PM  Result Value Ref Range Status   C Diff antigen NEGATIVE NEGATIVE Final   C Diff toxin NEGATIVE NEGATIVE Final   C Diff interpretation Negative for C. difficile  Final  Culture, expectorated  sputum-assessment     Status: None   Collection Time: 06/16/15 10:15 AM  Result Value Ref Range Status   Specimen Description SPUTUM  Final   Special Requests Normal  Final   Sputum evaluation THIS SPECIMEN IS ACCEPTABLE FOR SPUTUM CULTURE  Final   Report Status 06/16/2015 FINAL  Final  Culture, respiratory (NON-Expectorated)     Status: None (Preliminary result)   Collection Time: 06/16/15 10:15 AM  Result Value Ref Range Status   Specimen Description SPUTUM  Final   Special Requests Normal Reflexed from [id number]  Final   Gram Stain   Final    FEW WBC SEEN NO ORGANISMS SEEN EXCELLENT SPECIMEN - 90-100% WBCS    Culture   Final    MODERATE GROWTH YEAST IDENTIFICATION TO FOLLOW ONCE ISOLATED    Report Status PENDING  Incomplete    RADIOLOGY:  Dg Chest 1 View  06/16/2015  CLINICAL DATA:  Acute onset of shortness of breath and productive cough. Recent internal fixation of left femoral fracture. Initial encounter. EXAM: CHEST 1 VIEW COMPARISON:  Chest radiograph performed 06/15/2015 FINDINGS: Left basilar airspace opacification raises concern for pneumonia. Mild vascular congestion is noted. Mild right basilar  airspace opacity is also seen. No definite pleural effusion or pneumothorax identified. The cardiomediastinal silhouette is mildly enlarged. The patient is status post median sternotomy, with evidence of prior CABG. A pacemaker/AICD is noted overlying the left chest wall, with leads ending overlying the right atrium and right ventricle. No acute osseous abnormalities are identified. IMPRESSION: 1. Left basilar airspace opacification raises concern for pneumonia. Mild right basilar airspace opacity also noted. This is similar in appearance to the prior study. 2. Mild vascular congestion and mild cardiomegaly noted. Electronically Signed   By: Roanna Raider M.D.   On: 06/16/2015 06:59    EKG:   Orders placed or performed during the hospital encounter of 06/13/15  . EKG 12-Lead  . EKG  12-Lead      Management plans discussed with the patient, family and they are in agreement.  CODE STATUS:     Code Status Orders        Start     Ordered   06/14/15 0311  Full code   Continuous     06/14/15 0310      TOTAL TIME TAKING CARE OF THIS PATIENT: 45  minutes.    @[facility] @  on 06/19/2015 at 11:40 AM  Between 7am to 6pm - Pager - [phone]  After 6pm go to [email or web address] - password [email or web address] [facility]  [facility]  Office  [phone]  CC: Primary care physician; Sula Rumple, MD

## 2015-06-19 NOTE — Discharge Instructions (Signed)
Repeat PT/INR on 06/20/2015 and further management of Coumadin by the primary care physician or skilled nursing facility provider Continue antibiotics until December 26 Follow-up with primary care physician in 2-3 days Follow-up with infectious disease Dr. Sampson GoonFitzgerald in a week Follow-up with urology Dr. Apolinar JunesBrandon in a week Follow-up with cardiology in a week Continue wound care on the right heel ulcer  Continue 2 L of oxygen via nasal cannula   continue Foley catheter for a week until seen and evaluated by urology

## 2015-06-19 NOTE — Progress Notes (Signed)
ANTICOAGULATION CONSULT NOTE - Initial Consult  Pharmacy Consult for warfarin Indication: atrial fibrillation  No Known Allergies  Patient Measurements: Height: 5\' 7"  (170.2 cm) Weight: 173 lb 6 oz (78.642 kg) IBW/kg (Calculated) : 66.1  Vital Signs: Temp: 99.2 F (37.3 C) (12/16 0435) Temp Source: Oral (12/16 0435) BP: 132/45 mmHg (12/16 0435) Pulse Rate: 59 (12/16 0435)  Labs:  Recent Labs  06/16/15 0635 06/17/15 0555 06/18/15 0605 06/19/15 0516  HGB 9.3* 9.3*  --  9.2*  HCT 27.4* 27.9*  --  27.6*  PLT 192 199  --  234  LABPROT 29.4* 20.7* 19.2* 20.5*  INR 2.84 1.78 1.61 1.76  CREATININE 0.80 0.68 0.82  --     Estimated Creatinine Clearance: 63.8 mL/min (by C-G formula based on Cr of 0.82).   Medical History: Past Medical History  Diagnosis Date  . Diabetes mellitus without complication (HCC)   . Hypertension   . A-fib (HCC)   . GERD (gastroesophageal reflux disease)   . CHF (congestive heart failure) (HCC)   . Fracture, femur [facility](HCC)     Assessment: 81 yo patient need anticoagulation with warfarin. Pharmacy consulted for dosing and monitoring.  Prior to admission patient was taking warfarin 5mg  daily. Patient was supratherapeutic on admission.   12/9: INR 3.53 12/11: INR 5.04 12/12: INR 5.53 12/13: INR 2.84 12/14: INR 1.78 12/15: INR 1.61   Goal of Therapy:  INR 2-3 Monitor platelets by anticoagulation protocol: Yes   Plan:  INR is subtherapeutic today. Pt warfarin had been held for 2 days due to supratherapeutic INR. Warfarin was resumed 2 days ago, has recieved 2.5mg  and 5mg . Will give pt 5mg  tonight. Recheck INR tomorrow AM.   12/16 AM INR 1.76. Resuming home dose of 5 mg daily. INR daily ordered while on abx.    Fulton ReekMatt Meeka Cartelli, PharmD, BCPS  06/19/2015

## 2015-06-19 NOTE — [place] FL2 (Signed)
[place] MEDICAID FL2 LEVEL OF CARE SCREENING TOOL     IDENTIFICATION  Patient Name: Riley BimlerMilton Birkey Jr. Birthdate: Nov 11, 1929 Sex: male Admission Date (Current Location): 06/13/2015  [place]  [facility]([place] )   Facility and Address:  [facility], [address], [place], [place] [address]      Provider Number: [id number]  Attending Physician Name and Address:  Milagros LollSrikar Sudini, MD  Relative Name and Phone Number:       Current Level of Care: Hospital Recommended Level of Care: Skilled Nursing Facility Prior Approval Number:    Date Approved/Denied:   PASRR Number:  ( [id number] )  Discharge Plan: SNF    Current Diagnoses: Patient Active Problem List   Diagnosis Date Noted  . Pressure ulcer 06/16/2015  . Gross hematuria   . Sepsis (HCC) 06/13/2015  . Hip fracture (HCC) 05/18/2015    Orientation RESPIRATION BLADDER Height & Weight    Self, Time, Situation, Place  O2 (2 Liters oxygen ) Continent 5\' 7"  (170.2 cm) 173 lbs.  BEHAVIORAL SYMPTOMS/MOOD NEUROLOGICAL BOWEL NUTRITION STATUS   (none )  (none ) Continent Diet (Diet: Soft )  AMBULATORY STATUS COMMUNICATION OF NEEDS Skin   Extensive Assist Verbally PU Stage and Appropriate Care (Pressure Ulcer Stage 2: Right Heel. Pressure Ulcer Stage 1: Left  Heel. )                       Personal Care Assistance Level of Assistance  Bathing, Feeding, Dressing Bathing Assistance: Limited assistance Feeding assistance: Independent Dressing Assistance: Limited assistance     Functional Limitations Info  Sight, Hearing, Speech Sight Info: Adequate Hearing Info: Adequate Speech Info: Adequate    SPECIAL CARE FACTORS FREQUENCY  PT (By licensed PT), OT (By licensed OT)     PT Frequency:  (5) OT Frequency:  (5)            Contractures      Additional Factors Info  Code Status, Insulin Sliding Scale, Isolation Precautions Code Status Info:  (Full Code.  ) Allergies Info:  (Bactrim Sulfamethoxazole-trimethoprim)   Insulin Sliding Scale Info:  (Novolog Insulin Injections: 3 times daily. ) Isolation Precautions Info:  (VRE)     Current Medications (06/19/2015):  This is the current hospital active medication list Current Facility-Administered Medications  Medication Dose Route Frequency Provider Last Rate Last Dose  . acetaminophen (TYLENOL) tablet 650 mg  650 mg Oral Q6H PRN Katharina Caperima Vaickute, MD   650 mg at 06/19/15 0602   Or  . acetaminophen (TYLENOL) suppository 650 mg  650 mg Rectal Q6H PRN Katharina Caperima Vaickute, MD   650 mg at 06/13/15 2110  . Ampicillin-Sulbactam (UNASYN) 3 g in sodium chloride 0.9 % 100 mL IVPB  3 g Intravenous Q6H Clydie Braunavid Fitzgerald, MD   3 g at 06/19/15 0849  . benzonatate (TESSALON) capsule 200 mg  200 mg Oral TID PRN Oralia Manisavid Willis, MD   200 mg at 06/18/15 0801  . bisacodyl (DULCOLAX) suppository 10 mg  10 mg Rectal Daily PRN Katharina Caperima Vaickute, MD      . chlorpheniramine-HYDROcodone (TUSSIONEX) 10-8 MG/5ML suspension 5 mL  5 mL Oral Q12H PRN Adrian SaranSital Mody, MD   5 mL at 06/19/15 0551  . feeding supplement (ENSURE ENLIVE) (ENSURE ENLIVE) liquid 237 mL  237 mL Oral TID WC Sital Mody, MD   237 mL at 06/19/15 1200  . ferrous sulfate tablet 325 mg  325 mg Oral Q breakfast Adrian SaranSital Mody, MD  325 mg at 06/19/15 0847  . furosemide (LASIX) injection 20 mg  20 mg Intravenous BID Milagros Loll, MD   20 mg at 06/19/15 0848  . gabapentin (NEURONTIN) capsule 100 mg  100 mg Oral QHS Adrian Saran, MD   100 mg at 06/18/15 2105  . insulin aspart (novoLOG) injection 0-9 Units  0-9 Units Subcutaneous TID WC Katharina Caper, MD   3 Units at 06/19/15 1141  . magic mouthwash  10 mL Oral QID Adrian Saran, MD   10 mL at 06/19/15 0849  . menthol-cetylpyridinium (CEPACOL) lozenge 3 mg  1 lozenge Oral PRN Adrian Saran, MD      . mometasone-formoterol (DULERA) 200-5 MCG/ACT inhaler 2 puff  2 puff Inhalation BID Erin Fulling, MD   2 puff at 06/19/15 0848  . nitroGLYCERIN  (NITROSTAT) SL tablet 0.4 mg  0.4 mg Sublingual Q5 min PRN Katharina Caper, MD      . ondansetron (ZOFRAN) tablet 4 mg  4 mg Oral Q6H PRN Katharina Caper, MD       Or  . ondansetron (ZOFRAN) injection 4 mg  4 mg Intravenous Q6H PRN Katharina Caper, MD   4 mg at 06/15/15 1016  . oxyCODONE (Oxy IR/ROXICODONE) immediate release tablet 5-10 mg  5-10 mg Oral Q4H PRN Adrian Saran, MD   5 mg at 06/14/15 1155  . pantoprazole (PROTONIX) EC tablet 40 mg  40 mg Oral Daily Adrian Saran, MD   40 mg at 06/19/15 0847  . potassium chloride SA (K-DUR,KLOR-CON) CR tablet 40 mEq  40 mEq Oral Daily Milagros Loll, MD   40 mEq at 06/19/15 0847  . pravastatin (PRAVACHOL) tablet 10 mg  10 mg Oral q1800 Adrian Saran, MD   10 mg at 06/18/15 1612  . promethazine (PHENERGAN) injection 12.5 mg  12.5 mg Intravenous Once Adrian Saran, MD   12.5 mg at 06/14/15 1100  . sodium chloride 0.9 % injection 3 mL  3 mL Intravenous Q12H Katharina Caper, MD   3 mL at 06/18/15 2106  . tamsulosin (FLOMAX) capsule 0.4 mg  0.4 mg Oral BID Adrian Saran, MD   0.4 mg at 06/19/15 0847  . tiotropium (SPIRIVA) inhalation capsule 18 mcg  18 mcg Inhalation Daily Erin Fulling, MD   18 mcg at 06/19/15 0848  . warfarin (COUMADIN) tablet 5 mg  5 mg Oral q1800 Adrian Saran, MD      . Warfarin - Pharmacist Dosing Inpatient   Does not apply q1800 Gardner Candle, [facility]         Discharge Medications: Please see discharge summary for a list of discharge medications.  Relevant Imaging Results:  Relevant Lab Results:   Additional Information  (SSN: [id number])  Haig Prophet, LCSW

## 2015-06-19 NOTE — Progress Notes (Signed)
PA for Dr Marlou PorchHerrick paged and notified of discharge to facility, patient still has foley in place. Per PA, keep foley in for one week and follow up with Vanna ScotlandAshley Brandon in a week. Dr Amado CoeGouru notified.

## 2015-06-19 NOTE — Progress Notes (Signed)
Patient is medically stable for D/C back to [facility] today. [facility] is going to room E-15. RN will call report and arrange EMS for transport. Clinical Child psychotherapistocial Worker (CSW) sent D/C Summary, D/C packet and FL2 to [facility] via [facility]. Patient is aware of above. Patient's wife was at bedside and aware of above. Please reconsult if future social work needs arise. CSW signing off.   Jetta LoutBailey Morgan, LCSWA [phone]

## 2015-06-19 NOTE — Consult Note (Signed)
MEDICATION RELATED CONSULT NOTE - follow up  Pharmacy Consult for electrolytes Indication: hpokalemia/hypomagnesium  No Known Allergies  Patient Measurements: Height: 5\' 7"  (170.2 cm) Weight: 173 lb 6 oz (78.642 kg) IBW/kg (Calculated) : 66.1   Vital Signs: Temp: 99.2 F (37.3 C) (12/16 0435) Temp Source: Oral (12/16 0435) BP: 132/45 mmHg (12/16 0435) Pulse Rate: 59 (12/16 0435) Intake/Output from previous day: 12/15 0701 - 12/16 0700 In: 980 [P.O.:480; IV Piggyback:500] Out: 2650 [Urine:2650] Intake/Output from this shift:    Labs:  Recent Labs  06/17/15 0555 06/17/15 1756 06/18/15 0605 06/19/15 0516  WBC 7.5  --   --  9.7  HGB 9.3*  --   --  9.2*  HCT 27.9*  --   --  27.6*  PLT 199  --   --  234  CREATININE 0.68  --  0.82 0.64  MG 1.5* 1.9  --  1.9  12/14  1756  Potassium= 3.4  Estimated Creatinine Clearance: 65.4 mL/min (by C-G formula based on Cr of 0.64).   Microbiology: Recent Results (from the past 720 hour(s))  Blood Culture (routine x 2)     Status: None   Collection Time: 06/13/15  7:40 PM  Result Value Ref Range Status   Specimen Description BLOOD LEFT ASSIST CONTROL  Final   Special Requests BOTTLES DRAWN AEROBIC AND ANAEROBIC 4CC  Final   Culture  Setup Time   Final    GRAM POSITIVE COCCI ANAEROBIC BOTTLE ONLY CRITICAL RESULT CALLED TO, READ BACK BY AND VERIFIED WITH: CHERYL SMITH,RN 06/14/2015 1405 BY JRS.    Culture   Final    ENTEROCOCCUS GALLINARUM ANAEROBIC BOTTLE ONLY Results Called to: APRIL RAST AT 0755 06/16/15 DV VRE HAVE INTRINSIC RESISTANCE TO MOST COMMONLY USED ANTIBIOTICS AND THE ABILITY TO ACQUIRE RESISTANCE TO MOST AVAILABLE ANTIBIOTICS.    Report Status 06/18/2015 FINAL  Final   Organism ID, Bacteria ENTEROCOCCUS GALLINARUM  Final      Susceptibility   Enterococcus gallinarum - MIC*    AMPICILLIN <=2 SENSITIVE Sensitive     LINEZOLID Value in next row Sensitive      SENSITIVE2    VANCOMYCIN Value in next row  Resistant      RESISTANT<=0.5    GENTAMICIN SYNERGY Value in next row Sensitive      RESISTANT<=0.5    * ENTEROCOCCUS GALLINARUM  Blood Culture (routine x 2)     Status: None   Collection Time: 06/13/15  7:40 PM  Result Value Ref Range Status   Specimen Description BLOOD LEFT HAND  Final   Special Requests BOTTLES DRAWN AEROBIC AND ANAEROBIC 4CC  Final   Culture NO GROWTH 5 DAYS  Final   Report Status 06/18/2015 FINAL  Final  Urine culture     Status: None   Collection Time: 06/13/15  7:41 PM  Result Value Ref Range Status   Specimen Description URINE, RANDOM  Final   Special Requests NONE  Final   Culture NO GROWTH 2 DAYS  Final   Report Status 06/15/2015 FINAL  Final  MRSA PCR Screening     Status: None   Collection Time: 06/13/15 11:48 PM  Result Value Ref Range Status   MRSA by PCR NEGATIVE NEGATIVE Final    Comment:        The GeneXpert MRSA Assay (FDA approved for NASAL specimens only), is one component of a comprehensive MRSA colonization surveillance program. It is not intended to diagnose MRSA infection nor to guide or monitor treatment for MRSA  infections.   Culture, blood (routine x 2)     Status: None (Preliminary result)   Collection Time: 06/15/15 12:10 PM  Result Value Ref Range Status   Specimen Description BLOOD RIGHT ASSIST CONTROL  Final   Special Requests BOTTLES DRAWN AEROBIC AND ANAEROBIC  3CC  Final   Culture NO GROWTH 3 DAYS  Final   Report Status PENDING  Incomplete  Culture, blood (routine x 2)     Status: None (Preliminary result)   Collection Time: 06/15/15 12:10 PM  Result Value Ref Range Status   Specimen Description BLOOD RIGHT HAND  Final   Special Requests BOTTLES DRAWN AEROBIC AND ANAEROBIC  5CC  Final   Culture NO GROWTH 3 DAYS  Final   Report Status PENDING  Incomplete  Urine culture     Status: None   Collection Time: 06/15/15  7:00 PM  Result Value Ref Range Status   Specimen Description URINE, CATHETERIZED  Final   Special  Requests NONE  Final   Culture NO GROWTH 1 DAY  Final   Report Status 06/17/2015 FINAL  Final  C difficile quick scan w PCR reflex     Status: None   Collection Time: 06/15/15  7:00 PM  Result Value Ref Range Status   C Diff antigen NEGATIVE NEGATIVE Final   C Diff toxin NEGATIVE NEGATIVE Final   C Diff interpretation Negative for C. difficile  Final  Culture, expectorated sputum-assessment     Status: None   Collection Time: 06/16/15 10:15 AM  Result Value Ref Range Status   Specimen Description SPUTUM  Final   Special Requests Normal  Final   Sputum evaluation THIS SPECIMEN IS ACCEPTABLE FOR SPUTUM CULTURE  Final   Report Status 06/16/2015 FINAL  Final  Culture, respiratory (NON-Expectorated)     Status: None (Preliminary result)   Collection Time: 06/16/15 10:15 AM  Result Value Ref Range Status   Specimen Description SPUTUM  Final   Special Requests Normal Reflexed from [id number]  Final   Gram Stain   Final    FEW WBC SEEN NO ORGANISMS SEEN EXCELLENT SPECIMEN - 90-100% WBCS    Culture MODERATE GROWTH YEAST IDENTIFICATION TO FOLLOW   Final   Report Status PENDING  Incomplete    Medical History: Past Medical History  Diagnosis Date  . Diabetes mellitus without complication (HCC)   . Hypertension   . A-fib (HCC)   . GERD (gastroesophageal reflux disease)   . CHF (congestive heart failure) (HCC)   . Fracture, femur (HCC)     Medications:  Scheduled:  . ampicillin-sulbactam (UNASYN) IV  3 g Intravenous Q6H  . feeding supplement (ENSURE ENLIVE)  237 mL Oral TID WC  . ferrous sulfate  325 mg Oral Q breakfast  . furosemide  20 mg Intravenous BID  . gabapentin  100 mg Oral QHS  . Influenza vac split quadrivalent PF  0.5 mL Intramuscular Tomorrow-1000  . insulin aspart  0-9 Units Subcutaneous TID WC  . magic mouthwash  10 mL Oral QID  . mometasone-formoterol  2 puff Inhalation BID  . pantoprazole  40 mg Oral Daily  . potassium chloride  40 mEq Oral Daily  . pravastatin   10 mg Oral q1800  . promethazine  12.5 mg Intravenous Once  . sodium chloride  3 mL Intravenous Q12H  . tamsulosin  0.4 mg Oral BID  . tiotropium  18 mcg Inhalation Daily  . warfarin  5 mg Oral q1800  . Warfarin - Pharmacist Dosing  Inpatient   Does not apply q1800    Assessment: Pt is a 81 year old male being treated for sepsis due to aspiration PNA and bacteremia. Pt has received 2 doses of lasix  IV yesterday. Pt had also been on zosyn for 5 days which is known to cause electrolytes disturbances. Pt now on Unasyn.   K: 3.2, Mag: 1.9 on 12/15 at 0600 Follow Up K at 1600: 3.8  Goal of Therapy:  K=3.5-5 Mg=1.7-2.4  Plan:  Follow up AM K =3.5. Mg=1.9. No further supplementation needed. Will recheck K in the AM. If K drops again, may need daily supplementation while on lasix.  Olene Floss, Pharm.D Clinical Pharmacist 06/19/2015

## 2015-06-19 NOTE — Progress Notes (Signed)
[facility] called to [facility].

## 2015-06-20 LAB — CULTURE, BLOOD (ROUTINE X 2)
CULTURE: NO GROWTH
Culture: NO GROWTH

## 2015-06-21 LAB — CULTURE, RESPIRATORY W GRAM STAIN: Special Requests: NORMAL

## 2015-06-21 LAB — CULTURE, RESPIRATORY

## 2015-07-15 ENCOUNTER — Encounter: Payer: Self-pay | Admitting: Emergency Medicine

## 2015-07-15 ENCOUNTER — Emergency Department: Payer: Medicare Other

## 2015-07-15 ENCOUNTER — Inpatient Hospital Stay
Admission: EM | Admit: 2015-07-15 | Discharge: 2015-07-20 | DRG: 871 | Disposition: A | Discharge status: Skilled Nursing Facility | Payer: Medicare Other | Attending: Internal Medicine | Admitting: Internal Medicine

## 2015-07-15 DIAGNOSIS — E43 Unspecified severe protein-calorie malnutrition: Secondary | ICD-10-CM | POA: Diagnosis present

## 2015-07-15 DIAGNOSIS — J441 Chronic obstructive pulmonary disease with (acute) exacerbation: Secondary | ICD-10-CM | POA: Diagnosis present

## 2015-07-15 DIAGNOSIS — Z881 Allergy status to other antibiotic agents status: Secondary | ICD-10-CM | POA: Diagnosis not present

## 2015-07-15 DIAGNOSIS — E1310 Other specified diabetes mellitus with ketoacidosis without coma: Secondary | ICD-10-CM

## 2015-07-15 DIAGNOSIS — Z9889 Other specified postprocedural states: Secondary | ICD-10-CM

## 2015-07-15 DIAGNOSIS — Z6823 Body mass index (BMI) 23.0-23.9, adult: Secondary | ICD-10-CM | POA: Diagnosis not present

## 2015-07-15 DIAGNOSIS — A419 Sepsis, unspecified organism: Principal | ICD-10-CM | POA: Diagnosis present

## 2015-07-15 DIAGNOSIS — Z7901 Long term (current) use of anticoagulants: Secondary | ICD-10-CM | POA: Diagnosis not present

## 2015-07-15 DIAGNOSIS — Z87891 Personal history of nicotine dependence: Secondary | ICD-10-CM | POA: Diagnosis not present

## 2015-07-15 DIAGNOSIS — E119 Type 2 diabetes mellitus without complications: Secondary | ICD-10-CM | POA: Diagnosis present

## 2015-07-15 DIAGNOSIS — Z950 Presence of cardiac pacemaker: Secondary | ICD-10-CM

## 2015-07-15 DIAGNOSIS — Z7982 Long term (current) use of aspirin: Secondary | ICD-10-CM | POA: Diagnosis not present

## 2015-07-15 DIAGNOSIS — N179 Acute kidney failure, unspecified: Secondary | ICD-10-CM | POA: Diagnosis present

## 2015-07-15 DIAGNOSIS — Z7984 Long term (current) use of oral hypoglycemic drugs: Secondary | ICD-10-CM | POA: Diagnosis not present

## 2015-07-15 DIAGNOSIS — J159 Unspecified bacterial pneumonia: Secondary | ICD-10-CM | POA: Diagnosis present

## 2015-07-15 DIAGNOSIS — E875 Hyperkalemia: Secondary | ICD-10-CM | POA: Diagnosis present

## 2015-07-15 DIAGNOSIS — J440 Chronic obstructive pulmonary disease with acute lower respiratory infection: Secondary | ICD-10-CM | POA: Diagnosis present

## 2015-07-15 DIAGNOSIS — I482 Chronic atrial fibrillation: Secondary | ICD-10-CM | POA: Diagnosis present

## 2015-07-15 DIAGNOSIS — L89152 Pressure ulcer of sacral region, stage 2: Secondary | ICD-10-CM | POA: Diagnosis present

## 2015-07-15 DIAGNOSIS — R531 Weakness: Secondary | ICD-10-CM

## 2015-07-15 DIAGNOSIS — Z95810 Presence of automatic (implantable) cardiac defibrillator: Secondary | ICD-10-CM

## 2015-07-15 DIAGNOSIS — J181 Lobar pneumonia, unspecified organism: Secondary | ICD-10-CM

## 2015-07-15 DIAGNOSIS — K219 Gastro-esophageal reflux disease without esophagitis: Secondary | ICD-10-CM | POA: Diagnosis present

## 2015-07-15 DIAGNOSIS — Y95 Nosocomial condition: Secondary | ICD-10-CM | POA: Diagnosis present

## 2015-07-15 DIAGNOSIS — R627 Adult failure to thrive: Secondary | ICD-10-CM

## 2015-07-15 DIAGNOSIS — Z8781 Personal history of (healed) traumatic fracture: Secondary | ICD-10-CM

## 2015-07-15 DIAGNOSIS — I509 Heart failure, unspecified: Secondary | ICD-10-CM | POA: Diagnosis present

## 2015-07-15 DIAGNOSIS — Z794 Long term (current) use of insulin: Secondary | ICD-10-CM | POA: Diagnosis not present

## 2015-07-15 DIAGNOSIS — J189 Pneumonia, unspecified organism: Secondary | ICD-10-CM

## 2015-07-15 DIAGNOSIS — L89159 Pressure ulcer of sacral region, unspecified stage: Secondary | ICD-10-CM | POA: Diagnosis present

## 2015-07-15 DIAGNOSIS — Z79899 Other long term (current) drug therapy: Secondary | ICD-10-CM

## 2015-07-15 DIAGNOSIS — J9601 Acute respiratory failure with hypoxia: Secondary | ICD-10-CM | POA: Diagnosis present

## 2015-07-15 DIAGNOSIS — Z7401 Bed confinement status: Secondary | ICD-10-CM

## 2015-07-15 LAB — URINALYSIS COMPLETE WITH MICROSCOPIC (ARMC ONLY)
Bacteria, UA: NONE SEEN
Bilirubin Urine: NEGATIVE
Glucose, UA: NEGATIVE mg/dL
KETONES UR: NEGATIVE mg/dL
LEUKOCYTES UA: NEGATIVE
Nitrite: NEGATIVE
PH: 5 (ref 5.0–8.0)
PROTEIN: NEGATIVE mg/dL
SPECIFIC GRAVITY, URINE: 1.014 (ref 1.005–1.030)
SQUAMOUS EPITHELIAL / LPF: NONE SEEN

## 2015-07-15 LAB — COMPREHENSIVE METABOLIC PANEL
ALBUMIN: 2.6 g/dL — AB (ref 3.5–5.0)
ALT: 13 U/L — ABNORMAL LOW (ref 17–63)
ANION GAP: 3 — AB (ref 5–15)
AST: 14 U/L — ABNORMAL LOW (ref 15–41)
Alkaline Phosphatase: 70 U/L (ref 38–126)
BILIRUBIN TOTAL: 0.8 mg/dL (ref 0.3–1.2)
BUN: 43 mg/dL — ABNORMAL HIGH (ref 6–20)
CHLORIDE: 110 mmol/L (ref 101–111)
CO2: 22 mmol/L (ref 22–32)
Calcium: 9.1 mg/dL (ref 8.9–10.3)
Creatinine, Ser: 1.6 mg/dL — ABNORMAL HIGH (ref 0.61–1.24)
GFR calc Af Amer: 44 mL/min — ABNORMAL LOW (ref >60)
GFR calc non Af Amer: 38 mL/min — ABNORMAL LOW (ref >60)
GLUCOSE: 128 mg/dL — AB (ref 65–99)
POTASSIUM: 6.2 mmol/L — AB (ref 3.5–5.1)
SODIUM: 135 mmol/L (ref 135–145)
TOTAL PROTEIN: 6.5 g/dL (ref 6.5–8.1)

## 2015-07-15 LAB — CBC WITH DIFFERENTIAL/PLATELET
BASOS ABS: 0 10*3/uL (ref 0–0.1)
BASOS PCT: 0 %
EOS ABS: 0.1 10*3/uL (ref 0–0.7)
EOS PCT: 1 %
HEMATOCRIT: 29.7 % — AB (ref 40.0–52.0)
Hemoglobin: 9.8 g/dL — ABNORMAL LOW (ref 13.0–18.0)
Lymphocytes Relative: 9 %
Lymphs Abs: 1.4 10*3/uL (ref 1.0–3.6)
MCH: 29 pg (ref 26.0–34.0)
MCHC: 33 g/dL (ref 32.0–36.0)
MCV: 87.6 fL (ref 80.0–100.0)
MONO ABS: 0.8 10*3/uL (ref 0.2–1.0)
MONOS PCT: 5 %
Neutro Abs: 13.4 10*3/uL — ABNORMAL HIGH (ref 1.4–6.5)
Neutrophils Relative %: 85 %
PLATELETS: 248 10*3/uL (ref 150–440)
RBC: 3.39 MIL/uL — ABNORMAL LOW (ref 4.40–5.90)
RDW: 15 % — AB (ref 11.5–14.5)
WBC: 15.7 10*3/uL — ABNORMAL HIGH (ref 3.8–10.6)

## 2015-07-15 LAB — POTASSIUM: Potassium: 6.4 mmol/L — ABNORMAL HIGH (ref 3.5–5.1)

## 2015-07-15 LAB — GLUCOSE, CAPILLARY
Glucose-Capillary: 124 mg/dL — ABNORMAL HIGH (ref 65–99)
Glucose-Capillary: 218 mg/dL — ABNORMAL HIGH (ref 65–99)

## 2015-07-15 LAB — PROTIME-INR
INR: 3.52
Prothrombin Time: 34.5 seconds — ABNORMAL HIGH (ref 11.4–15.0)

## 2015-07-15 LAB — LACTIC ACID, PLASMA: Lactic Acid, Venous: 2.4 mmol/L (ref 0.5–2.0)

## 2015-07-15 LAB — APTT: APTT: 80 s — AB (ref 24–36)

## 2015-07-15 MED ORDER — PIPERACILLIN-TAZOBACTAM 3.375 G IVPB
3.3750 g | Freq: Once | INTRAVENOUS | Status: DC
  Filled 2015-07-15 (×2): qty 50

## 2015-07-15 MED ORDER — SODIUM POLYSTYRENE SULFONATE 15 GM/60ML PO SUSP
30.0000 g | Freq: Once | ORAL | Status: AC
  Administered 2015-07-15: 30 g via ORAL

## 2015-07-15 MED ORDER — ALBUTEROL SULFATE (2.5 MG/3ML) 0.083% IN NEBU
2.5000 mg | INHALATION_SOLUTION | Freq: Once | RESPIRATORY_TRACT | Status: AC
  Administered 2015-07-15: 2.5 mg via RESPIRATORY_TRACT
  Filled 2015-07-15: qty 3

## 2015-07-15 MED ORDER — INSULIN ASPART 100 UNIT/ML ~~LOC~~ SOLN
SUBCUTANEOUS | Status: AC
  Administered 2015-07-15: 10 [IU]
  Filled 2015-07-15: qty 10

## 2015-07-15 MED ORDER — ONDANSETRON HCL 4 MG PO TABS: 4.0000 mg | ORAL_TABLET | Freq: Four times a day (QID) | ORAL | Status: DC | PRN

## 2015-07-15 MED ORDER — PIPERACILLIN-TAZOBACTAM 3.375 G IVPB 30 MIN
3.3750 g | Freq: Once | INTRAVENOUS | Status: AC
  Administered 2015-07-15: 3.375 g via INTRAVENOUS

## 2015-07-15 MED ORDER — INSULIN REGULAR HUMAN 100 UNIT/ML IJ SOLN
10.0000 [IU] | Freq: Once | INTRAMUSCULAR | Status: DC
  Filled 2015-07-15: qty 0.1

## 2015-07-15 MED ORDER — VANCOMYCIN HCL IN DEXTROSE 1-5 GM/200ML-% IV SOLN
1000.0000 mg | Freq: Once | INTRAVENOUS | Status: AC
  Administered 2015-07-15: 1000 mg via INTRAVENOUS
  Filled 2015-07-15: qty 200

## 2015-07-15 MED ORDER — SODIUM CHLORIDE 0.9 % IJ SOLN
3.0000 mL | Freq: Two times a day (BID) | INTRAMUSCULAR | Status: DC
  Administered 2015-07-16 – 2015-07-18 (×5): 3 mL via INTRAVENOUS

## 2015-07-15 MED ORDER — ACETAMINOPHEN 650 MG RE SUPP: 650.0000 mg | Freq: Four times a day (QID) | RECTAL | Status: DC | PRN

## 2015-07-15 MED ORDER — SODIUM CHLORIDE 0.9 % IV BOLUS (SEPSIS)
1000.0000 mL | Freq: Once | INTRAVENOUS | Status: AC
  Administered 2015-07-15: 1000 mL via INTRAVENOUS

## 2015-07-15 MED ORDER — DEXTROSE 50 % IV SOLN
1.0000 | Freq: Once | INTRAVENOUS | Status: AC
  Administered 2015-07-15: 50 mL via INTRAVENOUS
  Filled 2015-07-15: qty 50

## 2015-07-15 MED ORDER — ONDANSETRON HCL 4 MG/2ML IJ SOLN
4.0000 mg | Freq: Four times a day (QID) | INTRAMUSCULAR | Status: DC | PRN
  Filled 2015-07-15: qty 2

## 2015-07-15 MED ORDER — SODIUM POLYSTYRENE SULFONATE 15 GM/60ML PO SUSP
30.0000 g | Freq: Once | ORAL | Status: DC
  Filled 2015-07-15: qty 120

## 2015-07-15 MED ORDER — OXYCODONE HCL 5 MG PO TABS: 5.0000 mg | ORAL_TABLET | ORAL | Status: DC | PRN

## 2015-07-15 MED ORDER — DEXTROSE 50 % IV SOLN
25.0000 mL | Freq: Once | INTRAVENOUS | Status: DC
Start: 2015-07-15 — End: 2015-07-15

## 2015-07-15 MED ORDER — SODIUM CHLORIDE 0.9 % IV SOLN
INTRAVENOUS | Status: DC
  Administered 2015-07-16 – 2015-07-17 (×5): via INTRAVENOUS

## 2015-07-15 MED ORDER — IPRATROPIUM-ALBUTEROL 0.5-2.5 (3) MG/3ML IN SOLN: 3.0000 mL | RESPIRATORY_TRACT | Status: DC | PRN

## 2015-07-15 MED ORDER — CALCIUM GLUCONATE 10 % IV SOLN
1.0000 g | Freq: Once | INTRAVENOUS | Status: AC
  Administered 2015-07-15: 1 g via INTRAVENOUS
  Filled 2015-07-15 (×2): qty 10

## 2015-07-15 MED ORDER — MORPHINE SULFATE (PF) 2 MG/ML IV SOLN: 2.0000 mg | INTRAVENOUS | Status: DC | PRN

## 2015-07-15 MED ORDER — CALCIUM GLUCONATE 10 % IV SOLN
1.0000 g | Freq: Once | INTRAVENOUS | Status: DC
  Filled 2015-07-15: qty 10

## 2015-07-15 MED ORDER — ACETAMINOPHEN 325 MG PO TABS: 650.0000 mg | ORAL_TABLET | Freq: Four times a day (QID) | ORAL | Status: DC | PRN

## 2015-07-15 MED ORDER — INSULIN ASPART 100 UNIT/ML IV SOLN
10.0000 [IU] | Freq: Once | INTRAVENOUS | Status: DC
Start: 2015-07-15 — End: 2015-07-15

## 2015-07-15 MED ORDER — LEVOFLOXACIN IN D5W 750 MG/150ML IV SOLN
750.0000 mg | INTRAVENOUS | Status: DC
  Administered 2015-07-15: 750 mg via INTRAVENOUS
  Filled 2015-07-15: qty 150

## 2015-07-15 NOTE — H&P (Signed)
[facility] [place] [facility]   PATIENT NAME: Riley Sanchez    MR#:  [id number]  DATE OF BIRTH:  10-31-1929   DATE OF ADMISSION:  07/15/2015  PRIMARY CARE PHYSICIAN: Sula Rumple, MD   REQUESTING/REFERRING PHYSICIAN: Inocencio Homes  CHIEF COMPLAINT:   Chief Complaint  Patient presents with  . Fever    HISTORY OF PRESENT ILLNESS:  Riley Sanchez  is a 82 y.o. male with a known history of type 2 diabetes not insulin requiring, atrial fibrillation, chronic who is presenting with fever/cough. He is currently in a nursing facility after recently having hip surgery. He also of note had a recent bout of pneumonia has finished those antibiotics however since that time of last few days has developed worsening cough productive of yellowish sputum subjective fevers, chills. Of note he is bedbound since his recent surgery and was due to see orthopedic surgery 3 days for to see if he can tolerate weightbearing. Upon arrival to the emergency department he was noted to have an elevated potassium level, also be hypoxic  PAST MEDICAL HISTORY:   Past Medical History  Diagnosis Date  . Diabetes mellitus without complication (HCC)   . Hypertension   . A-fib (HCC)   . GERD (gastroesophageal reflux disease)   . CHF (congestive heart failure) (HCC)   . Fracture, femur (HCC)     PAST SURGICAL HISTORY:   Past Surgical History  Procedure Laterality Date  . Cardiac surgery      bypass in 87  . Cardiac defibrillator placement      replaced 1/16, medtronic  . Cardiac defibrillator placement      medtronic 1/16  . Hip pinning,cannulated Left 05/19/2015    Procedure: CANNULATED HIP PINNING;  Surgeon: Juanell Fairly, MD;  Location: [facility] ORS;  Service: Orthopedics;  Laterality: Left;    SOCIAL HISTORY:   Social History  Substance Use Topics  . Smoking status: Former Games developer  . Smokeless tobacco: Not on file  . Alcohol Use: No    FAMILY HISTORY:   Family  History  Problem Relation Age of Onset  . Diabetes Other   . Hypertension Other     DRUG ALLERGIES:   Allergies  Allergen Reactions  . Bactrim [Sulfamethoxazole-Trimethoprim] Other (See Comments)    Reaction:  Unknown     REVIEW OF SYSTEMS:  REVIEW OF SYSTEMS:  CONSTITUTIONAL: Positive fevers, chills, fatigue, weakness.  EYES: Denies blurred vision, double vision, or eye pain.  EARS, NOSE, THROAT: Denies tinnitus, ear pain, hearing loss.  RESPIRATORY: Positive cough, denies shortness of breath, wheezing  CARDIOVASCULAR: Denies chest pain, palpitations, edema.  GASTROINTESTINAL: Denies nausea, vomiting, diarrhea, abdominal pain.  GENITOURINARY: Denies dysuria, hematuria.  ENDOCRINE: Denies nocturia or thyroid problems. HEMATOLOGIC AND LYMPHATIC: Denies easy bruising or bleeding.  SKIN: Denies rash or lesions.  MUSCULOSKELETAL: Denies pain in neck, back, shoulder, knees, hips, or further arthritic symptoms.  NEUROLOGIC: Denies paralysis, paresthesias.  PSYCHIATRIC: Denies anxiety or depressive symptoms. Otherwise full review of systems performed by me is negative.   MEDICATIONS AT HOME:   Prior to Admission medications   Medication Sig Start Date End Date Taking? Authorizing Provider  acetaminophen (TYLENOL) 325 MG tablet Take 650 mg by mouth every 6 (six) hours as needed for mild pain, fever or headache.    Yes Historical Provider, MD  aspirin EC 81 MG tablet Take 81 mg by mouth daily.   Yes Historical Provider, MD  benzonatate (TESSALON) 200 MG capsule Take 1 capsule (200  mg total) by mouth 3 (three) times daily as needed for cough. 06/19/15  Yes Ramonita LabAruna Gouru, MD  cholecalciferol (VITAMIN D) 1000 UNITS tablet Take 1,000 Units by mouth daily.   Yes Historical Provider, MD  diphenhydrAMINE (BENADRYL) 25 mg capsule Take 25 mg by mouth at bedtime as needed for sleep.   Yes Historical Provider, MD  feeding supplement, ENSURE ENLIVE, (ENSURE ENLIVE) LIQD Take 237 mLs by mouth 3  (three) times daily with meals. 06/19/15  Yes Ramonita LabAruna Gouru, MD  ferrous sulfate 325 (65 FE) MG tablet Take 325 mg by mouth daily with breakfast.   Yes Historical Provider, MD  furosemide (LASIX) 20 MG tablet Take 1 tablet (20 mg total) by mouth 2 (two) times daily. 06/19/15  Yes Ramonita LabAruna Gouru, MD  gabapentin (NEURONTIN) 100 MG capsule Take 100 mg by mouth at bedtime.   Yes Historical Provider, MD  insulin aspart (NOVOLOG) 100 UNIT/ML injection Inject 1-9 Units into the skin 3 (three) times daily with meals as needed for high blood sugar. Pt uses per sliding scale:    121-150:  1 unit  151-200:  2 units  201-250:  3 units  251-300:  5 units  301-350:  7 units  351-400:  9 units  Greater than 400:  Call MD   Yes Historical Provider, MD  lisinopril (PRINIVIL,ZESTRIL) 10 MG tablet Take 10 mg by mouth daily.   Yes Historical Provider, MD  lovastatin (MEVACOR) 40 MG tablet Take 40 mg by mouth at bedtime.   Yes Historical Provider, MD  menthol-cetylpyridinium (CEPACOL) 3 MG lozenge Take 1 lozenge (3 mg total) by mouth as needed for sore throat. 06/19/15  Yes Ramonita LabAruna Gouru, MD  metFORMIN (GLUCOPHAGE) 1000 MG tablet Take 1,000 mg by mouth 2 (two) times daily with a meal.   Yes Historical Provider, MD  mometasone-formoterol (DULERA) 200-5 MCG/ACT AERO Inhale 2 puffs into the lungs 2 (two) times daily. 06/19/15  Yes Ramonita LabAruna Gouru, MD  omeprazole (PRILOSEC) 20 MG capsule Take 20 mg by mouth daily.   Yes Historical Provider, MD  oxyCODONE (OXY IR/ROXICODONE) 5 MG immediate release tablet Take 1-2 tablets (5-10 mg total) by mouth every 4 (four) hours as needed for severe pain. 06/19/15  Yes Ramonita LabAruna Gouru, MD  polyethylene glycol (MIRALAX / GLYCOLAX) packet Take 17 g by mouth daily as needed for moderate constipation.    Yes Historical Provider, MD  potassium chloride SA (K-DUR,KLOR-CON) 20 MEQ tablet Take 2 tablets (40 mEq total) by mouth daily. 06/19/15  Yes Ramonita LabAruna Gouru, MD  tiotropium (SPIRIVA) 18 MCG inhalation  capsule Place 1 capsule (18 mcg total) into inhaler and inhale daily. 06/19/15  Yes Ramonita LabAruna Gouru, MD  warfarin (COUMADIN) 5 MG tablet Take 1 tablet (5 mg total) by mouth daily. Patient not taking: Reported on 07/15/2015 06/19/15 06/20/15  Ramonita LabAruna Gouru, MD      VITAL SIGNS:  Blood pressure 117/42, pulse 104, temperature 98.1 F (36.7 C), temperature source Oral, resp. rate 23, height 5\' 6"  (1.676 m), weight 153 lb (69.4 kg), SpO2 94 %.  PHYSICAL EXAMINATION:  VITAL SIGNS: Filed Vitals:   07/15/15 2300 07/15/15 2330  BP: 106/49 117/42  Pulse: 90 104  Temp:    Resp: 20 23   GENERAL:82 y.o.male currently in no acute distress.  HEAD: Normocephalic, atraumatic.  EYES: Pupils equal, round, reactive to light. Extraocular muscles intact. No scleral icterus.  MOUTH: Moist mucosal membrane. Dentition intact. No abscess noted.  EAR, NOSE, THROAT: Clear without exudates. No external lesions.  NECK:  Supple. No thyromegaly. No nodules. No JVD.  PULMONARY: Diminished breath sounds with scattered basal rhonchi no wheezing No use of accessory muscles, poor respiratory effort. Poor air entry bilaterally CHEST: Nontender to palpation.  CARDIOVASCULAR: S1 and S2. Irregular rate and rhythm. No murmurs, rubs, or gallops. No edema. Pedal pulses 2+ bilaterally.  GASTROINTESTINAL: Soft, nontender, nondistended. No masses. Positive bowel sounds. No hepatosplenomegaly.  MUSCULOSKELETAL: No swelling, clubbing, or edema. Range of motion full in all extremities.  NEUROLOGIC: Cranial nerves II through XII are intact. No gross focal neurological deficits. Sensation intact. Reflexes intact.  SKIN: Right heel pressure ulcer with necrotic center minimal surrounding erythema sacral decubitus ulcer present as well No further ulceration, lesions, rashes, or cyanosis. Skin warm and dry. Turgor intact.  PSYCHIATRIC: Mood, affect within normal limits. The patient is awake, alert and oriented x 3. Insight, judgment intact.     LABORATORY PANEL:   CBC  Recent Labs Lab 07/15/15 1947  WBC 15.7*  HGB 9.8*  HCT 29.7*  PLT 248   ------------------------------------------------------------------------------------------------------------------  Chemistries   Recent Labs Lab 07/15/15 1947 07/15/15 2044  NA 135  --   K 6.2* 6.4*  CL 110  --   CO2 22  --   GLUCOSE 128*  --   BUN 43*  --   CREATININE 1.60*  --   CALCIUM 9.1  --   AST 14*  --   ALT 13*  --   ALKPHOS 70  --   BILITOT 0.8  --    ------------------------------------------------------------------------------------------------------------------  Cardiac Enzymes No results for input(s): TROPONINI in the last 168 hours. ------------------------------------------------------------------------------------------------------------------  RADIOLOGY:  Dg Chest 2 View  07/15/2015  CLINICAL DATA:  Cough, fever, confusion. EXAM: CHEST  2 VIEW COMPARISON:  06/16/2015 FINDINGS: Multi lead left-sided pacemaker remains in place. Patient is post median sternotomy. Cardiomegaly is decreased in the interim. Residual left lower lobe atelectasis, with improved aeration compared to prior exam. Right basilar opacity is unchanged. There is no pleural effusion. No pneumothorax. IMPRESSION: Bibasilar opacities again seen, improved left lung base aeration from prior. While this may represent atelectasis, pneumonia or aspiration are not excluded based on radiographic features alone. Electronically Signed   By: Rubye Oaks M.D.   On: 07/15/2015 20:19    EKG:   Orders placed or performed during the hospital encounter of 07/15/15  . EKG 12-Lead  . EKG 12-Lead    IMPRESSION AND PLAN:   82 year old Caucasian gentleman history of diabetes type 2 non-insulin-requiring presenting with cough and fever/  1.Sepsis, meeting septic criteria by heart rate, respiratory rate, leukocytosis present on arrival. Source healthcare associated pneumoniaPanculture.  Broad-spectrum antibiotics including vancomycin/Zosyn/Levaquin and taper antibiotics when culture data returns. Continue IV fluid hydration to keep mean arterial pressure greater than 65. He may require pressor therapy if blood pressure worsens. We will repeat lactic acid given the initial is greater than 2.2.  2. Acute kidney injury: IV fluid hydration and avoid further nephrotoxic agents 3. Hyperkalemia has been appropriately treated with IV calcium insulin dextrose Kayexalate, continue hydration and follow potassium levels 4. Sacral decubitus ulcer/pressure ulcer: Wound consult 5. Chronic atrial fibrillation supra therapeutic INR: Consult pharmacy for anticoagulation, currently rate controlled 6. Venous thromboembolism prophylactic: Therapeutic warfarin    All the records are reviewed and case discussed with ED provider. Management plans discussed with the patient, family and they are in agreement.  CODE STATUS: Full  TOTAL TIME TAKING CARE OF THIS PATIENT: 45 minutes.    Hower,  Mardi Mainland.D  on 07/15/2015 at 11:55 PM  Between 7am to 6pm - Pager - [phone]  After 6pm: House Pager: - [phone]  [facility]  Office  [phone]  CC: Primary care physician; Sula Rumple, MD

## 2015-07-15 NOTE — ED Notes (Signed)
Pt arrived by EMS from [facility] with confusion and fever. Report is that pt has had a recent UTI, being treated with rocephin, on day 6. [facility] reported that pt became lethargic and confused. Pt was taken off coumadin 07/14/15 due to lethargy. Pt has hx of DM, pacemaker, Afib.

## 2015-07-15 NOTE — ED Notes (Signed)
CBG 124 

## 2015-07-15 NOTE — ED Notes (Signed)
  CBG 218  

## 2015-07-15 NOTE — ED Provider Notes (Signed)
[facility] Emergency Department Provider Note  ____________________________________________  Time seen: Approximately 7:50 PM  I have reviewed the triage vital signs and the nursing notes.   HISTORY  Chief Complaint Fever  Caveat-history of present illness and review of systems is limited due to the patient's confusion.  HPI Riley BimlerMilton Saban Jr. is a 82 y.o. male with history of diabetes, hypertension, atrial fibrillation on coumadin, CHF s/p pacemaker who presents from [facility] for fever to 100.9, lethargy, confusion. The patient reports that he has no pain complaints. He denies any chest pain or abdominal pain, no vomiting, diarrhea, fevers or chills. According to staff at [facility], he was treated in this hospital last month for sepsis secondary to pneumonia. He is on day 6 out of 7 of Rocephin for an Upper respiratory tract infection symptoms. His INR was also elevated.   Past Medical History  Diagnosis Date  . Diabetes mellitus without complication (HCC)   . Hypertension   . A-fib (HCC)   . GERD (gastroesophageal reflux disease)   . CHF (congestive heart failure) (HCC)   . Fracture, femur [facility](HCC)     Patient Active Problem List   Diagnosis Date Noted  . Pressure ulcer 06/16/2015  . Gross hematuria   . Sepsis (HCC) 06/13/2015  . Hip fracture (HCC) 05/18/2015    Past Surgical History  Procedure Laterality Date  . Cardiac surgery      bypass in 87  . Cardiac defibrillator placement      replaced 1/16, medtronic  . Cardiac defibrillator placement      medtronic 1/16  . Hip pinning,cannulated Left 05/19/2015    Procedure: CANNULATED HIP PINNING;  Surgeon: Juanell FairlyKevin Krasinski, MD;  Location: [facility] ORS;  Service: Orthopedics;  Laterality: Left;    Current Outpatient Rx  Name  Route  Sig  Dispense  Refill  . acetaminophen (TYLENOL) 325 MG tablet   Oral   Take 650 mg by mouth every 6 (six) hours as needed.         Marland Kitchen. aspirin EC 81 MG tablet    Oral   Take 81 mg by mouth daily.         . benzonatate (TESSALON) 200 MG capsule   Oral   Take 1 capsule (200 mg total) by mouth 3 (three) times daily as needed for cough.   20 capsule   0   . cholecalciferol (VITAMIN D) 1000 UNITS tablet   Oral   Take 1,000 Units by mouth daily.         . diphenhydrAMINE (BENADRYL) 25 MG tablet   Oral   Take 25 mg by mouth at bedtime as needed for sleep.         . feeding supplement, ENSURE ENLIVE, (ENSURE ENLIVE) LIQD   Oral   Take 237 mLs by mouth 3 (three) times daily with meals.   237 mL   12   . ferrous sulfate 325 (65 FE) MG tablet   Oral   Take 325 mg by mouth daily with breakfast.         . furosemide (LASIX) 20 MG tablet   Oral   Take 1 tablet (20 mg total) by mouth 2 (two) times daily.   30 tablet   0   . gabapentin (NEURONTIN) 100 MG capsule   Oral   Take 100 mg by mouth at bedtime.         . insulin aspart (NOVOLOG) 100 UNIT/ML injection   Subcutaneous   Inject 0-9 Units  into the skin 3 (three) times daily with meals.   10 mL   11   . lisinopril (PRINIVIL,ZESTRIL) 10 MG tablet   Oral   Take 10 mg by mouth daily.         Marland Kitchen lovastatin (MEVACOR) 40 MG tablet   Oral   Take 40 mg by mouth at bedtime.         Marland Kitchen menthol-cetylpyridinium (CEPACOL) 3 MG lozenge   Oral   Take 1 lozenge (3 mg total) by mouth as needed for sore throat.   100 tablet   12   . metFORMIN (GLUCOPHAGE) 1000 MG tablet   Oral   Take 1,000 mg by mouth 2 (two) times daily with a meal.         . mometasone-formoterol (DULERA) 200-5 MCG/ACT AERO   Inhalation   Inhale 2 puffs into the lungs 2 (two) times daily.   13 g   0   . nitroGLYCERIN (NITROSTAT) 0.4 MG SL tablet   Sublingual   Place 0.4 mg under the tongue every 5 (five) minutes as needed for chest pain.         Marland Kitchen omeprazole (PRILOSEC) 20 MG capsule   Oral   Take 20 mg by mouth daily.         Marland Kitchen oxyCODONE (OXY IR/ROXICODONE) 5 MG immediate release tablet    Oral   Take 1-2 tablets (5-10 mg total) by mouth every 4 (four) hours as needed for severe pain.   20 tablet   0   . polyethylene glycol (MIRALAX / GLYCOLAX) packet   Oral   Take 17 g by mouth daily as needed.         . potassium chloride SA (K-DUR,KLOR-CON) 20 MEQ tablet   Oral   Take 2 tablets (40 mEq total) by mouth daily.   20 tablet   0   . promethazine (PHENERGAN) 25 MG/ML injection   Intravenous   Inject 25 mg into the vein once.         . tamsulosin (FLOMAX) 0.4 MG CAPS capsule   Oral   Take 0.4 mg by mouth 2 (two) times daily.         Marland Kitchen tiotropium (SPIRIVA) 18 MCG inhalation capsule   Inhalation   Place 1 capsule (18 mcg total) into inhaler and inhale daily.   30 capsule   12   . EXPIRED: warfarin (COUMADIN) 5 MG tablet   Oral   Take 1 tablet (5 mg total) by mouth daily.   3 tablet   0     Allergies Review of patient's allergies indicates no known allergies.  History reviewed. No pertinent family history.  Social History Social History  Substance Use Topics  . Smoking status: Former Games developer  . Smokeless tobacco: None  . Alcohol Use: No    Review of Systems Constitutional: No fever/chills Eyes: No visual changes. ENT: No sore throat. Cardiovascular: Denies chest pain. Respiratory: Denies shortness of breath. Gastrointestinal: No abdominal pain.  No nausea, no vomiting.  No diarrhea.  No constipation. Genitourinary: Negative for dysuria. Musculoskeletal: Negative for back pain. Skin: Negative for rash. Neurological: Negative for headaches, focal weakness or numbness.  10-point ROS otherwise negative.  ____________________________________________   PHYSICAL EXAM:  Filed Vitals:   07/15/15 1944 07/15/15 1957 07/15/15 2030  BP: 99/42 99/42 123/35  Pulse: 56 72 37  Temp:  98.1 F (36.7 C)   TempSrc:  Oral   Resp:  14 30  Height:  5\' 6"  (  1.676 m)   Weight:  153 lb (69.4 kg)   SpO2: 89% 97% 89%    Constitutional: Alert and  oriented to self and year but not to place. Nontoxic-appearing and in no acute distress. Eyes: Conjunctivae are normal. PERRL. EOMI. Head: Atraumatic. Nose: No congestion/rhinnorhea. Mouth/Throat: Mucous membranes are dry  Oropharynx non-erythematous. Neck: No stridor.   Cardiovascular: Normal rate, regular rhythm. Grossly normal heart sounds.  Good peripheral circulation. Respiratory: Normal respiratory effort.  No retractions. Diminished breath sounds bilateral bases. Gastrointestinal: Soft and nontender. No distention.  No CVA tenderness. Genitourinary: deferred Musculoskeletal: No lower extremity tenderness nor edema.  No joint effusions. Neurologic:  Normal speech and language. No gross focal neurologic deficits are appreciated.  Skin:  Skin is warm, dry and intact. No rash noted. Psychiatric: Mood and affect are normal. Speech and behavior are normal.  ____________________________________________   LABS (all labs ordered are listed, but only abnormal results are displayed)  Labs Reviewed  CBC WITH DIFFERENTIAL/PLATELET - Abnormal; Notable for the following:    WBC 15.7 (*)    RBC 3.39 (*)    Hemoglobin 9.8 (*)    HCT 29.7 (*)    RDW 15.0 (*)    Neutro Abs 13.4 (*)    All other components within normal limits  COMPREHENSIVE METABOLIC PANEL - Abnormal; Notable for the following:    Potassium 6.2 (*)    Glucose, Bld 128 (*)    BUN 43 (*)    Creatinine, Ser 1.60 (*)    Albumin 2.6 (*)    AST 14 (*)    ALT 13 (*)    GFR calc non Af Amer 38 (*)    GFR calc Af Amer 44 (*)    Anion gap 3 (*)    All other components within normal limits  URINALYSIS COMPLETEWITH MICROSCOPIC ([facility] ONLY) - Abnormal; Notable for the following:    Color, Urine YELLOW (*)    APPearance CLEAR (*)    Hgb urine dipstick 1+ (*)    All other components within normal limits  LACTIC ACID, PLASMA - Abnormal; Notable for the following:    Lactic Acid, Venous 2.4 (*)    All other components within  normal limits  PROTIME-INR - Abnormal; Notable for the following:    Prothrombin Time 34.5 (*)    All other components within normal limits  APTT - Abnormal; Notable for the following:    aPTT 80 (*)    All other components within normal limits  POTASSIUM - Abnormal; Notable for the following:    Potassium 6.4 (*)    All other components within normal limits  CULTURE, BLOOD (ROUTINE X 2)  CULTURE, BLOOD (ROUTINE X 2)  LACTIC ACID, PLASMA   ____________________________________________  EKG  ED ECG REPORT I, Gayla Doss, the attending physician, personally viewed and interpreted this ECG.   Date: 07/15/2015  EKG Time: 20:23  Rate: 62  Rhythm: Paced rhythm  ____________________________________________  RADIOLOGY  CXR IMPRESSION: Bibasilar opacities again seen, improved left lung base aeration from prior. While this may represent atelectasis, pneumonia or aspiration are not excluded based on radiographic features alone. ____________________________________________   PROCEDURES  Procedure(s) performed: None  Critical Care performed: Yes. Total critical care time spent 35 minutes.  ____________________________________________   INITIAL IMPRESSION / ASSESSMENT AND PLAN / ED COURSE  Pertinent labs & imaging results that were available during my care of the patient were reviewed by me and considered in my medical decision making (see chart for details).  Halston Kintz. is a 82 y.o. male with history of diabetes, hypertension, atrial fibrillation on coumadin, CHF who presents from [facility] for fever, lethargy, confusion. On exam, he does not appear to be in any acute distress. Vital signs are stable. He is afebrile here. Plan For screening labs, blood cultures, venous lactic acid, chest x-ray and urinalysis. Reassess for disposition.   ----------------------------------------- 9:58 PM on 07/15/2015 -----------------------------------------  Labs reviewed and  are concerning for leukocytosis with white blood cell count 15.7. Chronic stable anemia noted hemoglobin 9.8. Potassium is elevated at 6.2. Creatinine is nearly doubled at 1.6. Venous lactic acid is elevated at 2.4. Given intermittent tachypnea as well as documented fever at [facility], he is meeting multiple SIRS criteria. IV vancomycin, Zosyn, Levaquin ordered due to concern for possible recurrent healthcare associated pneumonia and given his mild hypoxia here (now requiring 2L supplemental O2). Urinalysis not consistent with infection. Blood cultures are pending. EKG shows paced rhythm but given the patient's hyperkalemia and my inability to evaluate for hyperkalemic EKG changes, we'll give insulin, glucose, calcium, albuterol and Kayexalate. Case discussed with hospitalist, Dr. Clint Guy for admission.   ____________________________________________   FINAL CLINICAL IMPRESSION(S) / ED DIAGNOSES  Final diagnoses:  Sepsis, due to unspecified organism (HCC)  HCAP (healthcare-associated pneumonia)  Hyperkalemia  AKI (acute kidney injury) (HCC)  Acute respiratory failure with hypoxia (HCC)      Gayla Doss, MD 07/15/15 2203

## 2015-07-16 DIAGNOSIS — A419 Sepsis, unspecified organism: Secondary | ICD-10-CM | POA: Diagnosis present

## 2015-07-16 LAB — BASIC METABOLIC PANEL
Anion gap: 3 — ABNORMAL LOW (ref 5–15)
BUN: 31 mg/dL — ABNORMAL HIGH (ref 6–20)
CHLORIDE: 113 mmol/L — AB (ref 101–111)
CO2: 22 mmol/L (ref 22–32)
Calcium: 8.5 mg/dL — ABNORMAL LOW (ref 8.9–10.3)
Creatinine, Ser: 1.22 mg/dL (ref 0.61–1.24)
GFR calc non Af Amer: 53 mL/min — ABNORMAL LOW (ref >60)
Glucose, Bld: 125 mg/dL — ABNORMAL HIGH (ref 65–99)
POTASSIUM: 4.3 mmol/L (ref 3.5–5.1)
SODIUM: 138 mmol/L (ref 135–145)

## 2015-07-16 LAB — PROTIME-INR
INR: 2.74
PROTHROMBIN TIME: 28.6 s — AB (ref 11.4–15.0)

## 2015-07-16 LAB — GLUCOSE, CAPILLARY
GLUCOSE-CAPILLARY: 118 mg/dL — AB (ref 65–99)
GLUCOSE-CAPILLARY: 157 mg/dL — AB (ref 65–99)
Glucose-Capillary: 100 mg/dL — ABNORMAL HIGH (ref 65–99)
Glucose-Capillary: 87 mg/dL (ref 65–99)
Glucose-Capillary: 90 mg/dL (ref 65–99)

## 2015-07-16 LAB — C DIFFICILE QUICK SCREEN W PCR REFLEX
C DIFFICLE (CDIFF) ANTIGEN: NEGATIVE
C Diff interpretation: NEGATIVE
C Diff toxin: NEGATIVE

## 2015-07-16 LAB — HEMOGLOBIN A1C: HEMOGLOBIN A1C: 5.7 % (ref 4.0–6.0)

## 2015-07-16 LAB — LACTIC ACID, PLASMA: Lactic Acid, Venous: 1.6 mmol/L (ref 0.5–2.0)

## 2015-07-16 LAB — MRSA PCR SCREENING: MRSA by PCR: NEGATIVE

## 2015-07-16 MED ORDER — GABAPENTIN 100 MG PO CAPS
100.0000 mg | ORAL_CAPSULE | Freq: Every day | ORAL | Status: DC
  Administered 2015-07-16 – 2015-07-19 (×5): 100 mg via ORAL
  Filled 2015-07-16 (×5): qty 1

## 2015-07-16 MED ORDER — LISINOPRIL 10 MG PO TABS: 10.0000 mg | ORAL_TABLET | Freq: Every day | ORAL | Status: DC

## 2015-07-16 MED ORDER — VANCOMYCIN HCL IN DEXTROSE 1-5 GM/200ML-% IV SOLN
1000.0000 mg | INTRAVENOUS | Status: DC
  Administered 2015-07-16 – 2015-07-17 (×2): 1000 mg via INTRAVENOUS
  Filled 2015-07-16 (×2): qty 200

## 2015-07-16 MED ORDER — INSULIN ASPART 100 UNIT/ML ~~LOC~~ SOLN
0.0000 [IU] | Freq: Every day | SUBCUTANEOUS | Status: DC
  Administered 2015-07-18 – 2015-07-19 (×2): 2 [IU] via SUBCUTANEOUS
  Filled 2015-07-16: qty 2

## 2015-07-16 MED ORDER — GUAIFENESIN ER 600 MG PO TB12
600.0000 mg | ORAL_TABLET | Freq: Two times a day (BID) | ORAL | Status: DC
  Administered 2015-07-16 – 2015-07-20 (×9): 600 mg via ORAL
  Filled 2015-07-16 (×10): qty 1

## 2015-07-16 MED ORDER — ASPIRIN EC 81 MG PO TBEC
81.0000 mg | DELAYED_RELEASE_TABLET | Freq: Every day | ORAL | Status: DC
  Administered 2015-07-16 – 2015-07-20 (×5): 81 mg via ORAL
  Filled 2015-07-16 (×5): qty 1

## 2015-07-16 MED ORDER — TIOTROPIUM BROMIDE MONOHYDRATE 18 MCG IN CAPS
18.0000 ug | ORAL_CAPSULE | Freq: Every day | RESPIRATORY_TRACT | Status: DC
  Administered 2015-07-16 – 2015-07-20 (×5): 18 ug via RESPIRATORY_TRACT
  Filled 2015-07-16 (×2): qty 5

## 2015-07-16 MED ORDER — FERROUS SULFATE 325 (65 FE) MG PO TABS
325.0000 mg | ORAL_TABLET | Freq: Every day | ORAL | Status: DC
  Administered 2015-07-16 – 2015-07-20 (×5): 325 mg via ORAL
  Filled 2015-07-16 (×6): qty 1

## 2015-07-16 MED ORDER — WARFARIN SODIUM 5 MG PO TABS
5.0000 mg | ORAL_TABLET | Freq: Every day | ORAL | Status: DC
  Administered 2015-07-16 – 2015-07-17 (×2): 5 mg via ORAL
  Filled 2015-07-16 (×2): qty 1

## 2015-07-16 MED ORDER — GUAIFENESIN-CODEINE 100-10 MG/5ML PO SOLN
10.0000 mL | ORAL | Status: DC | PRN
Start: 2015-07-16 — End: 2015-07-20
  Administered 2015-07-16 – 2015-07-20 (×8): 10 mL via ORAL
  Filled 2015-07-16 (×8): qty 10

## 2015-07-16 MED ORDER — PRAVASTATIN SODIUM 20 MG PO TABS
40.0000 mg | ORAL_TABLET | Freq: Every day | ORAL | Status: DC
  Administered 2015-07-16 – 2015-07-19 (×4): 40 mg via ORAL
  Filled 2015-07-16 (×5): qty 2

## 2015-07-16 MED ORDER — POLYETHYLENE GLYCOL 3350 17 G PO PACK
17.0000 g | PACK | Freq: Every day | ORAL | Status: DC | PRN
  Administered 2015-07-20: 17 g via ORAL
  Filled 2015-07-16: qty 1

## 2015-07-16 MED ORDER — PIPERACILLIN-TAZOBACTAM 4.5 G IVPB
4.5000 g | Freq: Three times a day (TID) | INTRAVENOUS | Status: DC
  Administered 2015-07-16 – 2015-07-17 (×4): 4.5 g via INTRAVENOUS
  Filled 2015-07-16 (×6): qty 100

## 2015-07-16 MED ORDER — VITAMIN D 1000 UNITS PO TABS
1000.0000 [IU] | ORAL_TABLET | Freq: Every day | ORAL | Status: DC
  Administered 2015-07-16 – 2015-07-20 (×5): 1000 [IU] via ORAL
  Filled 2015-07-16 (×5): qty 1

## 2015-07-16 MED ORDER — HYDROCOD POLST-CPM POLST ER 10-8 MG/5ML PO SUER
5.0000 mL | Freq: Two times a day (BID) | ORAL | Status: DC
  Administered 2015-07-16 – 2015-07-20 (×9): 5 mL via ORAL
  Filled 2015-07-16 (×9): qty 5

## 2015-07-16 MED ORDER — LEVOFLOXACIN IN D5W 750 MG/150ML IV SOLN: 750.0000 mg | INTRAVENOUS | Status: DC

## 2015-07-16 MED ORDER — MOMETASONE FURO-FORMOTEROL FUM 200-5 MCG/ACT IN AERO
2.0000 | INHALATION_SPRAY | Freq: Two times a day (BID) | RESPIRATORY_TRACT | Status: DC
  Administered 2015-07-16 – 2015-07-20 (×10): 2 via RESPIRATORY_TRACT
  Filled 2015-07-16: qty 8.8

## 2015-07-16 MED ORDER — ENSURE ENLIVE PO LIQD
237.0000 mL | Freq: Three times a day (TID) | ORAL | Status: DC
  Administered 2015-07-16 – 2015-07-20 (×14): 237 mL via ORAL

## 2015-07-16 MED ORDER — DIPHENHYDRAMINE HCL 25 MG PO CAPS: 25.0000 mg | ORAL_CAPSULE | Freq: Every evening | ORAL | Status: DC | PRN

## 2015-07-16 MED ORDER — PANTOPRAZOLE SODIUM 40 MG PO TBEC
40.0000 mg | DELAYED_RELEASE_TABLET | Freq: Every day | ORAL | Status: DC
  Administered 2015-07-16 – 2015-07-20 (×5): 40 mg via ORAL
  Filled 2015-07-16 (×5): qty 1

## 2015-07-16 MED ORDER — INSULIN ASPART 100 UNIT/ML ~~LOC~~ SOLN
0.0000 [IU] | Freq: Three times a day (TID) | SUBCUTANEOUS | Status: DC
  Administered 2015-07-16: 2 [IU] via SUBCUTANEOUS
  Administered 2015-07-17 – 2015-07-18 (×4): 1 [IU] via SUBCUTANEOUS
  Administered 2015-07-19: 3 [IU] via SUBCUTANEOUS
  Administered 2015-07-19: 2 [IU] via SUBCUTANEOUS
  Administered 2015-07-19: 5 [IU] via SUBCUTANEOUS
  Administered 2015-07-20 (×2): 2 [IU] via SUBCUTANEOUS
  Filled 2015-07-16: qty 1
  Filled 2015-07-16: qty 5
  Filled 2015-07-16: qty 2
  Filled 2015-07-16: qty 1
  Filled 2015-07-16: qty 2
  Filled 2015-07-16: qty 1
  Filled 2015-07-16 (×2): qty 2
  Filled 2015-07-16: qty 1
  Filled 2015-07-16: qty 2
  Filled 2015-07-16: qty 3

## 2015-07-16 MED ORDER — DEXTROSE 5 % IV SOLN
500.0000 mg | INTRAVENOUS | Status: DC
  Administered 2015-07-16 – 2015-07-19 (×4): 500 mg via INTRAVENOUS
  Filled 2015-07-16 (×4): qty 500

## 2015-07-16 NOTE — Progress Notes (Signed)
Pt had 8 beat run of V tach, is now paced with HR in the 80s.  MD Dr. Clint GuyHower notified, RN will continue to monitor. Syliva Overmanassie A Deriana Vanderhoef, RN

## 2015-07-16 NOTE — Care Management (Signed)
Patient presented from [facility] with hypoxia and potassium of 6.4. He was receiving skilled rehab after hip surgery.  CSW aware of admisison

## 2015-07-16 NOTE — Progress Notes (Signed)
ANTIBIOTIC CONSULT NOTE - INITIAL  Pharmacy Consult for vancomycin, Zosyn, and Levaquin dosing Indication: HCAP  Allergies  Allergen Reactions  . Bactrim [Sulfamethoxazole-Trimethoprim] Other (See Comments)    Reaction:  Unknown     Patient Measurements: Height: 5\' 6"  (167.6 cm) Weight: 153 lb (69.4 kg) IBW/kg (Calculated) : 63.8 Adjusted Body Weight: 69.4kg  Vital Signs: Temp: 98.1 F (36.7 C) (01/12 0153) Temp Source: Oral (01/12 0153) BP: 144/40 mmHg (01/12 0153) Pulse Rate: 58 (01/12 0153) Intake/Output from previous day:   Intake/Output from this shift:    Labs:  Recent Labs  07/15/15 1947  WBC 15.7*  HGB 9.8*  PLT 248  CREATININE 1.60*   Estimated Creatinine Clearance: 31.6 mL/min (by C-G formula based on Cr of 1.6). No results for input(s): VANCOTROUGH, VANCOPEAK, VANCORANDOM, GENTTROUGH, GENTPEAK, GENTRANDOM, TOBRATROUGH, TOBRAPEAK, TOBRARND, AMIKACINPEAK, AMIKACINTROU, AMIKACIN in the last 72 hours.   Microbiology: Recent Results (from the past 720 hour(s))  Culture, expectorated sputum-assessment     Status: None   Collection Time: 06/16/15 10:15 AM  Result Value Ref Range Status   Specimen Description SPUTUM  Final   Special Requests Normal  Final   Sputum evaluation THIS SPECIMEN IS ACCEPTABLE FOR SPUTUM CULTURE  Final   Report Status 06/16/2015 FINAL  Final  Culture, respiratory (NON-Expectorated)     Status: None   Collection Time: 06/16/15 10:15 AM  Result Value Ref Range Status   Specimen Description SPUTUM  Final   Special Requests Normal Reflexed from [id number]  Final   Gram Stain   Final    FEW WBC SEEN NO ORGANISMS SEEN EXCELLENT SPECIMEN - 90-100% WBCS    Culture MODERATE GROWTH CANDIDA ALBICANS  Final   Report Status 06/21/2015 FINAL  Final    Medical History: Past Medical History  Diagnosis Date  . Diabetes mellitus without complication (HCC)   . Hypertension   . A-fib (HCC)   . GERD (gastroesophageal reflux disease)   . CHF  (congestive heart failure) (HCC)   . Fracture, femur (HCC)     Medications:   Assessment: Blood cx pending UA: (-)  CXR: bibasilar opacities  Goal of Therapy:  Vancomycin trough level 15-20 mcg/ml  Plan:  TBW 9.4kg  IBW 63.8kg  DW 69.4kg  Vd 49L kei 0.031 hr-1  t1/2 22 hours Vancomycin 1 gram q 24 hours ordered with stacked dosing.  Level before 5th dose.  Zosyn 4.5 grams q 8 hours ordered for Pseudomonas risk of recent abx usage.  Levaquin 750 mg IV q 48 hours ordered.  Dewitt Judice S 07/16/2015,2:43 AM

## 2015-07-16 NOTE — Clinical Social Work Note (Signed)
Clinical Social Work Assessment  Patient Details  Name: Riley Sanchez. MRN: [id number] Date of Birth: 1930/01/25  Date of referral:  07/16/15               Reason for consult:  Discharge Planning                Permission sought to share information with:  Facility Sport and exercise psychologist, Family Supports (Riley Sanchez (wife) ) Permission granted to share information::  Yes, Verbal Permission Granted  Name::        Agency::   ([facility] )  Relationship::   Riley Sanchez (wife) [phone])  Contact Information:     Housing/Transportation Living arrangements for the past 2 months:  [place] Licensed conveyancer ) Source of Information:  Patient, Production assistant, radio ) Patient Interpreter Needed:  None Criminal Activity/Legal Involvement Pertinent to Current Situation/Hospitalization:  No - Comment as needed Significant Relationships:  Spouse Lives with:  Facility Resident, Spouse ([facility] STR ) Do you feel safe going back to the place where you live?  Yes Need for family participation in patient care:  Yes (Comment) Riley Sanchez (wife) [phone])  Care giving concerns:  Patient is a STR resident from [facility].    Social Worker assessment / plan:  CSW met with patient at bedside. Patient confirmed that he is a Copy resident at [facility]. He granted CSW verbal permission to contact his wife Riley Sanchez) to coordinate discharge arrangements. CSW contacted patient's wife, Riley Sanchez. Per Riley Sanchez patient will return to [facility] at discharge. She reports that she would like patient to be transported by EMS. Verbal permission granted to coordinate discharge with [facility].   CSW contacted [place], admissions coordinator at [facility]. Per Arville Go patient can return to [facility] at discharge "if the facility can meet his needs".  FL2 and PASRR complete and faxed to [facility]. CSW will continue to follow and assist.    Employment status:  Retired Forensic scientist:  Medicare PT  Recommendations:  Not assessed at this time Information / Referral to community resources:  [place] ([facility] )  Patient/Family's Response to care: Patient and his wife were agreeable to patient returning to [facility] at discharge.   Patient/Family's Understanding of and Emotional Response to Diagnosis, Current Treatment, and Prognosis:  Patient and his wife were pleasant and appreciative to assistance from CSW with discharge planning.   Emotional Assessment Appearance:  Appears older than stated age Attitude/Demeanor/Rapport:   (None ) Affect (typically observed):  Calm, Pleasant Orientation:  Oriented to Self, Oriented to Place, Oriented to  Time, Oriented to Situation Alcohol / Substance use:  Not Applicable Psych involvement (Current and /or in the community):  No (Comment)  Discharge Needs  Concerns to be addressed:  Discharge Planning Concerns Readmission within the last 30 days:  No Current discharge risk:  Cognitively Impaired Barriers to Discharge:  Continued Medical Work up   [employer], LCSW 07/16/2015, 10:20 AM

## 2015-07-16 NOTE — Progress Notes (Signed)
MD Dr. Clint GuyHower notified of pt cough and diarrhea. Given orders to administer Robitussin and check for C diff. RN will continue to monitor. Syliva Overmanassie A Lantz Hermann, RN

## 2015-07-16 NOTE — NC FL2 (Signed)
[place] MEDICAID FL2 LEVEL OF CARE SCREENING TOOL     IDENTIFICATION  Patient Name: Riley Sanchez. Birthdate: [birth year] Sex: male Admission Date (Current Location): 07/15/2015  [place] and [place] Number:  Chiropodist and Address:  [facility], [address], [place], [place] [address]      Provider Number: [id number]  Attending Physician Name and Address:  Katharina Caper, MD  Relative Name and Phone Number:       Current Level of Care: Hospital Recommended Level of Care: Skilled Nursing Facility Prior Approval Number:    Date Approved/Denied:   PASRR Number:  [id number])  Discharge Plan: SNF    Current Diagnoses: Patient Active Problem List   Diagnosis Date Noted  . Sepsis (HCC) 07/16/2015  . Hyperkalemia 07/15/2015  . Acute kidney injury (HCC) 07/15/2015  . Sacral decubitus ulcer 07/15/2015  . Hip fracture (HCC) 05/18/2015    Orientation RESPIRATION BLADDER Height & Weight    Self, Situation, Place  O2 (2 Liters Oxygen ) Continent 5\' 6"  (167.6 cm) 146 lbs.  BEHAVIORAL SYMPTOMS/MOOD NEUROLOGICAL BOWEL NUTRITION STATUS   (none )  (none ) Incontinent Diet (Diet: Heart Healthy )  AMBULATORY STATUS COMMUNICATION OF NEEDS Skin   Extensive Assist Verbally PU Stage and Appropriate Care (Pressure Ulcer Stage 2: Sacrum. Pressure Ulcer Unstagable Right Heel. Pressure Ulcer Stage1: Left Heel. )                       Personal Care Assistance Level of Assistance  Bathing, Feeding, Dressing Bathing Assistance: Limited assistance Feeding assistance: Independent Dressing Assistance: Limited assistance     Functional Limitations Info  Sight, Hearing, Speech Sight Info: Adequate Hearing Info: Adequate Speech Info: Adequate    SPECIAL CARE FACTORS FREQUENCY  PT (By licensed PT), OT (By licensed OT)     PT Frequency:  (5) OT Frequency:  (5)            Contractures      Additional Factors Info  Code Status,  Allergies, Insulin Sliding Scale, Isolation Precautions Code Status Info:  (Full Code. 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 0.9 %  sodium chloride infusion   Intravenous Continuous Wyatt Haste, MD 100 mL/hr at 07/16/15 0307    . acetaminophen (TYLENOL) tablet 650 mg  650 mg Oral Q6H PRN Wyatt Haste, MD       Or  . acetaminophen (TYLENOL) suppository 650 mg  650 mg Rectal Q6H PRN Wyatt Haste, MD      . aspirin EC tablet 81 mg  81 mg Oral Daily Wyatt Haste, MD   81 mg at 07/16/15 0933  . cholecalciferol (VITAMIN D) tablet 1,000 Units  1,000 Units Oral Daily Wyatt Haste, MD   1,000 Units at 07/16/15 0933  . diphenhydrAMINE (BENADRYL) capsule 25 mg  25 mg Oral QHS PRN Wyatt Haste, MD      . feeding supplement (ENSURE ENLIVE) (ENSURE ENLIVE) liquid 237 mL  237 mL Oral TID WC Wyatt Haste, MD   237 mL at 07/16/15 0931  . ferrous sulfate tablet 325 mg  325 mg Oral Q breakfast Cletis Athens  Hower, MD   325 mg at 07/16/15 0812  . gabapentin (NEURONTIN) capsule 100 mg  100 mg Oral QHS Wyatt Hasteavid K Hower, MD   100 mg at 07/16/15 0307  . guaiFENesin-codeine 100-10 MG/5ML solution 10 mL  10 mL Oral Q4H PRN Wyatt Hasteavid K Hower, MD   10 mL at 07/16/15 0307  . insulin aspart (novoLOG) injection 0-5 Units  0-5 Units Subcutaneous QHS Wyatt Hasteavid K Hower, MD   0 Units at 07/16/15 0145  . insulin aspart (novoLOG) injection 0-9 Units  0-9 Units Subcutaneous TID WC Wyatt Hasteavid K Hower, MD   0 Units at 07/16/15 0800  . ipratropium-albuterol (DUONEB) 0.5-2.5 (3) MG/3ML nebulizer solution 3 mL  3 mL Nebulization Q4H PRN Wyatt Hasteavid K Hower, MD      . Melene Muller[START ON 07/17/2015] levofloxacin  (LEVAQUIN) IVPB 750 mg  750 mg Intravenous Q48H Wyatt Hasteavid K Hower, MD      . mometasone-formoterol [facility](DULERA) 200-5 MCG/ACT inhaler 2 puff  2 puff Inhalation BID Wyatt Hasteavid K Hower, MD   2 puff at 07/16/15 0932  . morphine 2 MG/ML injection 2 mg  2 mg Intravenous Q4H PRN Wyatt Hasteavid K Hower, MD      . ondansetron [facility](ZOFRAN) tablet 4 mg  4 mg Oral Q6H PRN Wyatt Hasteavid K Hower, MD       Or  . ondansetron [facility](ZOFRAN) injection 4 mg  4 mg Intravenous Q6H PRN Wyatt Hasteavid K Hower, MD      . oxyCODONE (Oxy IR/ROXICODONE) immediate release tablet 5 mg  5 mg Oral Q4H PRN Wyatt Hasteavid K Hower, MD      . pantoprazole (PROTONIX) EC tablet 40 mg  40 mg Oral Daily Wyatt Hasteavid K Hower, MD   40 mg at 07/16/15 0933  . piperacillin-tazobactam (ZOSYN) IVPB 4.5 g  4.5 g Intravenous 3 times per day Wyatt Hasteavid K Hower, MD   4.5 g at 07/16/15 0603  . polyethylene glycol (MIRALAX / GLYCOLAX) packet 17 g  17 g Oral Daily PRN Wyatt Hasteavid K Hower, MD      . pravastatin (PRAVACHOL) tablet 40 mg  40 mg Oral q1800 Wyatt Hasteavid K Hower, MD      . sodium chloride 0.9 % injection 3 mL  3 mL Intravenous Q12H Wyatt Hasteavid K Hower, MD   3 mL at 07/16/15 0934  . tiotropium (SPIRIVA) inhalation capsule 18 mcg  18 mcg Inhalation Daily Wyatt Hasteavid K Hower, MD   18 mcg at 07/16/15 0931  . vancomycin (VANCOCIN) IVPB 1000 mg/200 mL premix  1,000 mg Intravenous Q24H Wyatt Hasteavid K Hower, MD   1,000 mg at 07/16/15 0814  . warfarin (COUMADIN) tablet 5 mg  5 mg Oral q1800 Wyatt Hasteavid K Hower, MD         Discharge Medications: Please see discharge summary for a list of discharge medications.  Relevant Imaging Results:  Relevant Lab Results:   Additional Information  (SSN: [id number])  Haig ProphetMorgan, Jaloni Sorber G, LCSW

## 2015-07-16 NOTE — Consult Note (Signed)
WOC wound consult note Reason for Consult: Moisture Associated Skin Damage to sacrum (incontinence) Stage 2 pressure injury to right heel Left heel boggy Wound type:MASD, stage 2 pressure injury Pressure Ulcer POA: Yes Measurement: Buttocks, perineal area 8 cm x 6 cm x 0.1 cm partial thickness breakdown from urinary incontinence Right heel 2 cm x 1.5 cm x 0.1 cm  Wound bed:100% pink and moist Drainage (amount, consistency, odor) Scant serous No odor Periwound: erythema Dressing procedure/placement/frequency:Cleanse skin to buttocks and perineum and pat gently dry.  Apply barrier cream twice daily and after incontinence.  No disposable briefs or underpads.   Cleanse right heel with NS and pat dry.  Apply silicone border foam dressing.  Change every 5 days and PRN soilage.  Will not follow at this time.  Please re-consult if needed.  Maple HudsonKaren Rollie Hynek RN BSN CWON Pager [phone]

## 2015-07-16 NOTE — Progress Notes (Signed)
Skin assessed with Deatra Jamesakera, RN. See flowsheets.

## 2015-07-16 NOTE — Progress Notes (Addendum)
ANTICOAGULATION CONSULT NOTE - Initial Consult  Pharmacy Consult for warfarin dosing Indication: atrial fibrillation  Allergies  Allergen Reactions  . Bactrim [Sulfamethoxazole-Trimethoprim] Other (See Comments)    Reaction:  Unknown     Patient Measurements: Height: 5\' 6"  (167.6 cm) Weight: 153 lb (69.4 kg) IBW/kg (Calculated) : 63.8 Heparin Dosing Weight: n/a  Vital Signs: Temp: 98.1 F (36.7 C) (01/12 0153) Temp Source: Oral (01/12 0153) BP: 144/40 mmHg (01/12 0153) Pulse Rate: 58 (01/12 0153)  Labs:  Recent Labs  07/15/15 1947  HGB 9.8*  HCT 29.7*  PLT 248  APTT 80*  LABPROT 34.5*  INR 3.52  CREATININE 1.60*    Estimated Creatinine Clearance: 31.6 mL/min (by C-G formula based on Cr of 1.6).   Medical History: Past Medical History  Diagnosis Date  . Diabetes mellitus without complication (HCC)   . Hypertension   . A-fib (HCC)   . GERD (gastroesophageal reflux disease)   . CHF (congestive heart failure) (HCC)   . Fracture, femur (HCC)     Medications:    Assessment: Hgb 9.8  INR 3.52  1/12 AM INR 2.74  Goal of Therapy:  INR 2-3 Monitor platelets by anticoagulation protocol: Yes   Plan:  Last PM INR 3.52.  1/12 AM INR therapeutic. Restarting home dose of 5 mg daily. INRs ordered daily while on antibiotics.  Tanai Bouler S 07/16/2015,2:52 AM

## 2015-07-16 NOTE — Progress Notes (Signed)
[facility] [place] [facility]   PATIENT NAME: Riley Sanchez    MR#:  [id number]  DATE OF BIRTH:  1930/06/16  SUBJECTIVE:  CHIEF COMPLAINT:   Chief Complaint  Patient presents with  . Fever   The patient is a 82 year old Caucasian male who presents to the hospital with complaints of worsening cough, yellow phlegm, fevers and chills, weakness. In emergency room, he was noted to be hypoxic, was noted to have elevated potassium level, which was treated with insulin, calcium gluconate and IV fluids and improved. , no nausea, vomiting, diarrhea noted.  Review of Systems  Unable to perform ROS: mental status change    VITAL SIGNS: Blood pressure 105/48, pulse 84, temperature 98.1 F (36.7 C), temperature source Oral, resp. rate 20, height 5\' 6"  (1.676 m), weight 66.633 kg (146 lb 14.4 oz), SpO2 93 %.  PHYSICAL EXAMINATION:   GENERAL:  82 y.o.-year-old patient lying in the berespiratory distress, tachypneic.  very weak, difficult to move around, even in the bed  EYES: Pupils equal, round, reactive to light and accommodation. No scleral icterus. Extraocular muscles intact.  HEENT: Head atraumatic, normocephalic. Oropharynx and nasopharynx clear.  NECK:  Supple, no jugular venous distention. No thyroid enlargement, no tenderness.  LUNGSDiminished breath   sounds bilaterally, scattered wheezing,  bed and bilateral basilar rales,rhonchi or crepitations noted, intermittent use of accessory muscles of respiration, mostly with exertion .  CARDIOVASCULAR: S1, S2 normal. No murmurs, rubs, or gallops.  ABDOMEN: Soft, nontender, nondistended. Bowel sounds present. No organomegaly or mass.  EXTREMITIES: No pedal edema, cyanosis, or clubbing.  NEUROLOGIC: Cranial nerves II through XII are intact. Muscle strength 3/5 in all extremities, locally week. Sensation intact. Gait not checked.  PSYCHIATRIC: The patient is somnolent   SKIN: No obvious rash, lesion, or ulcer.    ORDERS/RESULTS REVIEWED:   CBC  Recent Labs Lab 07/15/15 1947  WBC 15.7*  HGB 9.8*  HCT 29.7*  PLT 248  MCV 87.6  MCH 29.0  MCHC 33.0  RDW 15.0*  LYMPHSABS 1.4  MONOABS 0.8  EOSABS 0.1  BASOSABS 0.0   ------------------------------------------------------------------------------------------------------------------  Chemistries   Recent Labs Lab 07/15/15 1947 07/15/15 2044 07/16/15 0759  NA 135  --  138  K 6.2* 6.4* 4.3  CL 110  --  113*  CO2 22  --  22  GLUCOSE 128*  --  125*  BUN 43*  --  31*  CREATININE 1.60*  --  1.22  CALCIUM 9.1  --  8.5*  AST 14*  --   --   ALT 13*  --   --   ALKPHOS 70  --   --   BILITOT 0.8  --   --    ------------------------------------------------------------------------------------------------------------------ estimated creatinine clearance is 41.4 mL/min (by C-G formula based on Cr of 1.22). ------------------------------------------------------------------------------------------------------------------ No results for input(s): TSH, T4TOTAL, T3FREE, THYROIDAB in the last 72 hours.  Invalid input(s): FREET3  Cardiac Enzymes No results for input(s): CKMB, TROPONINI, MYOGLOBIN in the last 168 hours.  Invalid input(s): CK ------------------------------------------------------------------------------------------------------------------ Invalid input(s): POCBNP ---------------------------------------------------------------------------------------------------------------  RADIOLOGY: Dg Chest 2 View  07/15/2015  CLINICAL DATA:  Cough, fever, confusion. EXAM: CHEST  2 VIEW COMPARISON:  06/16/2015 FINDINGS: Multi lead left-sided pacemaker remains in place. Patient is post median sternotomy. Cardiomegaly is decreased in the interim. Residual left lower lobe atelectasis, with improved aeration compared to prior exam. Right basilar opacity is unchanged. There is no pleural effusion. No pneumothorax. IMPRESSION: Bibasilar opacities  again seen, improved left  lung base aeration from prior. While this may represent atelectasis, pneumonia or aspiration are not excluded based on radiographic features alone. Electronically Signed   By: Rubye OaksMelanie  Ehinger M.D.   On: 07/15/2015 20:19    EKG:  Orders placed or performed during the hospital encounter of 07/15/15  . EKG 12-Lead  . EKG 12-Lead    ASSESSMENT AND PLAN:  Active Problems:   Hyperkalemia   Acute kidney injury (HCC)   Sacral decubitus ulcer   Sepsis (HCC) 1. Sepsis due to bacterial pneumonia , continue intravenous therapy with broad-spectrum antibiotics, blood cultures are pending. MRSA PCR screening was pending , get sputum cultures if possible  2. COPD exacerbation due to pneumonia , initiate DuoNeb nebs , steroid inhaler , continue oxygen as needed. Follow clinically   3 . Acute renal insufficiency , resolved with IV fluid administration , follow labs.  4. Hyperkalemia, resolved with multiple medications, IV fluids  5. Leukocytosis , follow with antibiotic therapy. Blood cultures are pending , get sputum cultures if possible     Management plans discussed with the patient, family and they are in agreement.   DRUG ALLERGIES:  Allergies  Allergen Reactions  . Bactrim [Sulfamethoxazole-Trimethoprim] Other (See Comments)    Reaction:  Unknown     CODE STATUS:     Code Status Orders        Start     Ordered   07/15/15 2304  Full code   Continuous     07/15/15 2305    Code Status History    Date Active Date Inactive Code Status Order ID Comments User Context   06/14/2015  3:10 AM 06/19/2015  6:54 PM Full Code [id number]  Katharina Caperima Markale Birdsell, MD Inpatient   05/19/2015  2:07 PM 05/22/2015  5:04 PM Full Code [id number]  Juanell FairlyKevin Krasinski, MD Inpatient   05/18/2015  5:32 PM 05/19/2015  2:07 PM Full Code [id number]  Enedina FinnerSona Patel, MD Inpatient   05/18/2015  5:18 PM 05/18/2015  5:32 PM Full Code [id number]  Juanell FairlyKevin Krasinski, MD ED      TOTAL TIME TAKING CARE OF THIS  PATI40 minutes.   discussed with patient's wife   Vonnie Spagnolo M.D on 07/16/2015 at 2:30 PM  Between 7am to 6pm - Pager - [phone]  After 6pm go to [email or web address] - password [email or web address] [facility]  [facility]  Office  [phone]  CC: Primary care physician; Sula RumpleVirk, Charanjit, MD

## 2015-07-17 ENCOUNTER — Inpatient Hospital Stay: Payer: Medicare Other

## 2015-07-17 DIAGNOSIS — E43 Unspecified severe protein-calorie malnutrition: Secondary | ICD-10-CM

## 2015-07-17 LAB — CBC
HEMATOCRIT: 28 % — AB (ref 40.0–52.0)
HEMOGLOBIN: 9.3 g/dL — AB (ref 13.0–18.0)
MCH: 29.5 pg (ref 26.0–34.0)
MCHC: 33.4 g/dL (ref 32.0–36.0)
MCV: 88.3 fL (ref 80.0–100.0)
Platelets: 240 10*3/uL (ref 150–440)
RBC: 3.17 MIL/uL — ABNORMAL LOW (ref 4.40–5.90)
RDW: 14.9 % — ABNORMAL HIGH (ref 11.5–14.5)
WBC: 9.2 10*3/uL (ref 3.8–10.6)

## 2015-07-17 LAB — GLUCOSE, CAPILLARY
GLUCOSE-CAPILLARY: 102 mg/dL — AB (ref 65–99)
GLUCOSE-CAPILLARY: 138 mg/dL — AB (ref 65–99)
Glucose-Capillary: 99 mg/dL (ref 65–99)
Glucose-Capillary: 147 mg/dL — ABNORMAL HIGH (ref 65–99)

## 2015-07-17 LAB — BASIC METABOLIC PANEL
ANION GAP: 9 (ref 5–15)
BUN: 17 mg/dL (ref 6–20)
CHLORIDE: 108 mmol/L (ref 101–111)
CO2: 21 mmol/L — AB (ref 22–32)
Calcium: 8.5 mg/dL — ABNORMAL LOW (ref 8.9–10.3)
Creatinine, Ser: 0.99 mg/dL (ref 0.61–1.24)
GFR calc Af Amer: >60 mL/min (ref >60)
GFR calc non Af Amer: >60 mL/min (ref >60)
GLUCOSE: 104 mg/dL — AB (ref 65–99)
POTASSIUM: 3.5 mmol/L (ref 3.5–5.1)
Sodium: 138 mmol/L (ref 135–145)

## 2015-07-17 LAB — EXPECTORATED SPUTUM ASSESSMENT W GRAM STAIN, RFLX TO RESP C

## 2015-07-17 LAB — PROTIME-INR
INR: 1.67
Prothrombin Time: 19.7 seconds — ABNORMAL HIGH (ref 11.4–15.0)

## 2015-07-17 LAB — EXPECTORATED SPUTUM ASSESSMENT W REFEX TO RESP CULTURE: SPECIAL REQUESTS: NORMAL

## 2015-07-17 MED ORDER — PIPERACILLIN-TAZOBACTAM 3.375 G IVPB
3.3750 g | Freq: Three times a day (TID) | INTRAVENOUS | Status: DC
  Administered 2015-07-17 – 2015-07-20 (×10): 3.375 g via INTRAVENOUS
  Filled 2015-07-17 (×11): qty 50

## 2015-07-17 MED ORDER — KCL IN DEXTROSE-NACL 20-5-0.9 MEQ/L-%-% IV SOLN
INTRAVENOUS | Status: DC
  Administered 2015-07-17 – 2015-07-20 (×6): via INTRAVENOUS
  Filled 2015-07-17 (×7): qty 1000

## 2015-07-17 MED ORDER — VANCOMYCIN HCL IN DEXTROSE 1-5 GM/200ML-% IV SOLN: 1000.0000 mg | INTRAVENOUS | Status: DC

## 2015-07-17 MED ORDER — VANCOMYCIN HCL 10 G IV SOLR: 1250.0000 mg | INTRAVENOUS | Status: DC

## 2015-07-17 NOTE — Evaluation (Addendum)
Physical Therapy Evaluation Patient Details Name: Riley BimlerMilton Reidinger Jr. MRN: [id number] DOB: Oct 17, 1929 Today's Date: 07/17/2015   History of Present Illness  Patient is an 82 y/o male presenting from [facility] with worsening cough, fevers, weakness. L femoral neck fx on 05/19/2015 with percutaneus screw had been NWB, however per ortho note he is now considered WBAT.   Clinical Impression  Patient has recently been NWB for ~ 6 weeks after sustaining a L hip fx. He was seen by orthopedics on this admission and [facility]. Patient demonstrates globalized weakness and is unable to provide sufficient effort with sit to stand attempts to attempt any ambulation. Patient leans posteriorly heavily in both sitting and standing and his feet begin to slide from under him even with assist to block them. Patient demonstrates poor sitting balance currently with heavy L sided lean. Patient demonstrates significant dependence currently and is unable to ambulate at this time given his weakness, patient would continue to benefit from short term rehab after discharge to increase his independence with mobility.     Follow Up Recommendations SNF    Equipment Recommendations       Recommendations for Other Services       Precautions / Restrictions Precautions Precautions: Fall Restrictions Weight Bearing Restrictions: Yes LLE Weight Bearing: Weight bearing as tolerated      Mobility  Bed Mobility Overal bed mobility: Needs Assistance;+2 for physical assistance Bed Mobility: Supine to Sit;Sit to Supine     Supine to sit: Max assist Sit to supine: Max assist;+2 for physical assistance   General bed mobility comments: Patient with heavy L lean initially and requires significant assistance from PT to complete supine <--> sit transfer.   Transfers Overall transfer level: Needs assistance Equipment used: Rolling walker (2 wheeled) Transfers: Sit to/from Stand Sit to Stand: Mod assist;Max assist;+2  physical assistance         General transfer comment: Patient leans posteriorly and has his feet slide anteriorly on 2 attempts at standing. Unable to tolerate standing at this time secondary to LE weakness.   Ambulation/Gait             General Gait Details: Unable to ambulate   Stairs            Wheelchair Mobility    Modified Rankin (Stroke Patients Only)       Balance Overall balance assessment: Needs assistance Sitting-balance support: Bilateral upper extremity supported Sitting balance-Leahy Scale: Poor   Postural control: Posterior lean;Left lateral lean Standing balance support: Bilateral upper extremity supported Standing balance-Leahy Scale: Zero                               Pertinent Vitals/Pain Pain Assessment: Faces Faces Pain Scale: Hurts even more Pain Location: L hip? Patient confused and unable to provide details.  Pain Descriptors / Indicators: Discomfort Pain Intervention(s): Limited activity within patient's tolerance    Home Living Family/patient expects to be discharged to:: Skilled nursing facility                 Additional Comments: Owns RW    Prior Function           Comments: Pt independent prior to fall in November; pt has been at rehab since discharge from hospital (s/p surgery).  Pt reports h/o balance issues d/t LE neuropathy. HAs been NWB at rehab and unable to ambulate. Sounds like he was independent prior to the initial injury.  Hand Dominance        Extremity/Trunk Assessment   Upper Extremity Assessment: Generalized weakness           Lower Extremity Assessment: Generalized weakness         Communication   Communication: No difficulties  Cognition Arousal/Alertness: Awake/alert Behavior During Therapy: WFL for tasks assessed/performed Overall Cognitive Status: History of cognitive impairments - at baseline (It appears patient has some level of confusion at baseline? He  demonstrates confusion and mild perseveration in this session)                      General Comments      Exercises  Assisted patient with there-ex (SLRs, hip abductions in supine, knee flexion in supine x 10 of each bilaterally) as well as diaper and bedding change as both were soiled.       Assessment/Plan    PT Assessment Patient needs continued PT services  PT Diagnosis Difficulty walking;Generalized weakness   PT Problem List Decreased strength;Decreased knowledge of use of DME;Decreased safety awareness;Decreased activity tolerance;Decreased balance;Decreased mobility;Cardiopulmonary status limiting activity  PT Treatment Interventions DME instruction;Gait training;Stair training;Therapeutic activities;Therapeutic exercise;Balance training   PT Goals (Current goals can be found in the Care Plan section) Acute Rehab PT Goals Patient Stated Goal: To walk again PT Goal Formulation: With patient Time For Goal Achievement: 08-21-2015 Potential to Achieve Goals: Fair    Frequency Min 2X/week   Barriers to discharge        Co-evaluation               End of Session Equipment Utilized During Treatment: Gait belt Activity Tolerance: Patient tolerated treatment well;Patient limited by fatigue Patient left: in bed;with call bell/phone within reach;with bed alarm set Nurse Communication: Mobility status         Time: 1610-9604 PT Time Calculation (min) (ACUTE ONLY): 25 min   Charges:   PT Evaluation $PT Eval High Complexity: 1 Procedure PT Treatments $Therapeutic Activity: 8-22 mins   PT G Codes:       Kerin Ransom, PT, DPT    07/17/2015, 6:36 PM

## 2015-07-17 NOTE — Progress Notes (Signed)
Initial Nutrition Assessment  DOCUMENTATION CODES:   Severe malnutrition in context of acute illness/injury  INTERVENTION:   Meals and Snacks: Cater to patient preferences Medical Food Supplement Therapy: Agree with Ensure Enlive po TID, each supplement provides 350 kcal and 20 grams of protein, will send chocolate. Will also send Magic Cup BID. Coordination of Care: will recommend collecting new weight as weight in chart stated   NUTRITION DIAGNOSIS:   Malnutrition related to acute illness as evidenced by percent weight loss, energy intake < or equal to 50% for > or equal to 5 days, severe depletion of muscle mass, mild depletion of body fat.  GOAL:   Patient will meet greater than or equal to 90% of their needs  MONITOR:    (Energy Intake, Anthropometrics, Skin, Digestive System, Skin)  REASON FOR ASSESSMENT:   Malnutrition Screening Tool    ASSESSMENT:   Pt admitted with sepsis with healthcare aquired pna, acute kidney injury, and multiple pressure ulcers. Pt with recent hip surgery on 05/19/2015.  Past Medical History  Diagnosis Date  . Diabetes mellitus without complication (HCC)   . Hypertension   . A-fib (HCC)   . GERD (gastroesophageal reflux disease)   . CHF (congestive heart failure) (HCC)   . Fracture, femur (HCC)      Diet Order:  DIET DYS 2 Room service appropriate?: Yes; Fluid consistency:: Thin    Current Nutrition: Pt ate 25% of fruit plate at lunch today.  Food/Nutrition-Related History: Pt reports poor po intake for the past few weeks. Pt reports eating half of what he usually eats since his surgery. Pt wife reports going to [facility] every day and encouraging pt to eat at meal times. Pt reports drinking supplement 1-2 per day. RD notes on 06/13/2015 pt reported good appetite, drinking 2 Boost per day.   Scheduled Medications:  . aspirin EC  81 mg Oral Daily  . azithromycin  500 mg Intravenous Q24H  . chlorpheniramine-HYDROcodone  5 mL Oral  Q12H  . cholecalciferol  1,000 Units Oral Daily  . feeding supplement (ENSURE ENLIVE)  237 mL Oral TID WC  . ferrous sulfate  325 mg Oral Q breakfast  . gabapentin  100 mg Oral QHS  . guaiFENesin  600 mg Oral BID  . insulin aspart  0-5 Units Subcutaneous QHS  . insulin aspart  0-9 Units Subcutaneous TID WC  . mometasone-formoterol  2 puff Inhalation BID  . pantoprazole  40 mg Oral Daily  . piperacillin-tazobactam (ZOSYN)  IV  3.375 g Intravenous 3 times per day  . pravastatin  40 mg Oral q1800  . sodium chloride  3 mL Intravenous Q12H  . tiotropium  18 mcg Inhalation Daily  . warfarin  5 mg Oral q1800    Continuous Medications:  . dextrose 5 % and 0.9 % NaCl with KCl 20 mEq/L 75 mL/hr at 07/17/15 1600   D5 providing 306kcals over last 24 hours   Electrolyte/Renal Profile and Glucose Profile:   Recent Labs Lab 07/15/15 1947 07/15/15 2044 07/16/15 0759 07/17/15 0552  NA 135  --  138 138  K 6.2* 6.4* 4.3 3.5  CL 110  --  113* 108  CO2 22  --  22 21*  BUN 43*  --  31* 17  CREATININE 1.60*  --  1.22 0.99  CALCIUM 9.1  --  8.5* 8.5*  GLUCOSE 128*  --  125* 104*   Protein Profile:  Recent Labs Lab 07/15/15 1947  ALBUMIN 2.6*    Gastrointestinal  Profile: Last BM:  07/16/2015   Nutrition-Focused Physical Exam Findings: Nutrition-Focused physical exam completed. Findings are mild fat depletion, moderate-severe muscle depletion, and no edema.     Weight Change: Pt with weight loss of atleast 25lbs per pt and wife since surgery. Pt with 16% weight loss in the past month and 18% weight loss since surgery 05/18/2015 in 2 months.   Skin:   Multiple stage II pressure ulcers heel and sacral   Height:   Ht Readings from Last 1 Encounters:  07/15/15 5\' 6"  (1.676 m)    Weight:   Wt Readings from Last 1 Encounters:  07/16/15 146 lb 14.4 oz (66.633 kg)    Wt Readings from Last 10 Encounters:  07/16/15 146 lb 14.4 oz (66.633 kg)  06/13/15 173 lb 6 oz (78.642 kg)   05/18/15 178 lb (80.74 kg)     BMI:  Body mass index is 23.72 kg/(m^2).  Estimated Nutritional Needs:   Kcal:  BEE: 1293kcals, TEE: (IF 1.2-1.4)(AF 1.2) 1862-2181kcals  Protein:  73-86g protein (1.1-1.3g/kg)  Fluid:  1650-194580mL of fluid (25-8630mL/kg)  EDUCATION NEEDS:   No education needs identified at this time   HIGH Care Level  Leda QuailAllyson Nomie Buchberger, RD, LDN Pager [phone] Weekend/On-Call Pager [phone]

## 2015-07-17 NOTE — Care Management Important Message (Signed)
Important Message  Patient Details  Name: Riley BimlerMilton Sedano Jr. MRN: [id number] Date of Birth: 1929/12/22   Medicare Important Message Given:  Yes Medicare notice given    Berna BueCheryl Jermani Pund, RN 07/17/2015, 8:52 AM

## 2015-07-17 NOTE — Progress Notes (Signed)
Subjective:  Status post left hip percutaneous fixation for femoral neck hip fracture on 05/19/2015.  Patient reports left hip pain as minimal and rare.   I spoke with his wife in my office today. She informed me he is in the hospital. I ordered left hip x-rays today. He was due to see me in the clinic and instead I am seeing him at the bedside.  Objective:   VITALS:   Filed Vitals:   07/16/15 2204 07/17/15 0420 07/17/15 0952 07/17/15 1157  BP: 134/48 134/61 139/47 127/44  Pulse: 64 63 64 76  Temp:  98.4 F (36.9 C) 98.4 F (36.9 C) 99.7 F (37.6 C)  TempSrc:  Oral Oral Oral  Resp: 22 22 20 16   Height:      Weight:      SpO2: 100% 96% 94% 95%    PHYSICAL EXAM: Left lower extremity: Patient's skin is intact. He has no swelling, erythema or ecchymosis or effusion. His thigh compartments and leg compartments are soft and compressible. He has no calf tenderness. He has palpable pedal pulses. He has intact sensation in the left lower extremity which is at his baseline. He has baseline diabetic neuropathy. He can flex and extend his toes and dorsiflex and plantarflex his ankle. He can actively flex his knee and hip to 90 without significant pain. Hethen left hip pain with logrolling.   LABS  Results for orders placed or performed during the hospital encounter of 07/15/15 (from the past 24 hour(s))  Glucose, capillary     Status: None   Collection Time: 07/16/15  5:20 PM  Result Value Ref Range   Glucose-Capillary 87 65 - 99 mg/dL  Glucose, capillary     Status: None   Collection Time: 07/16/15 10:10 PM  Result Value Ref Range   Glucose-Capillary 90 65 - 99 mg/dL   Comment 1 Notify RN   Protime-INR     Status: Abnormal   Collection Time: 07/17/15  5:52 AM  Result Value Ref Range   Prothrombin Time 19.7 (H) 11.4 - 15.0 seconds   INR 1.67   Basic metabolic panel     Status: Abnormal   Collection Time: 07/17/15  5:52 AM  Result Value Ref Range   Sodium 138 135 - 145 mmol/L   Potassium 3.5 3.5 - 5.1 mmol/L   Chloride 108 101 - 111 mmol/L   CO2 21 (L) 22 - 32 mmol/L   Glucose, Bld 104 (H) 65 - 99 mg/dL   BUN 17 6 - 20 mg/dL   Creatinine, Ser 1.61 0.61 - 1.24 mg/dL   Calcium 8.5 (L) 8.9 - 10.3 mg/dL   GFR calc non Af Amer >60 >60 mL/min   GFR calc Af Amer >60 >60 mL/min   Anion gap 9 5 - 15  CBC     Status: Abnormal   Collection Time: 07/17/15  5:52 AM  Result Value Ref Range   WBC 9.2 3.8 - 10.6 K/uL   RBC 3.17 (L) 4.40 - 5.90 MIL/uL   Hemoglobin 9.3 (L) 13.0 - 18.0 g/dL   HCT 09.6 (L) 04.5 - 40.9 %   MCV 88.3 80.0 - 100.0 fL   MCH 29.5 26.0 - 34.0 pg   MCHC 33.4 32.0 - 36.0 g/dL   RDW 81.1 (H) 91.4 - 78.2 %   Platelets 240 150 - 440 K/uL  Glucose, capillary     Status: None   Collection Time: 07/17/15  7:49 AM  Result Value Ref Range   Glucose-Capillary  99 65 - 99 mg/dL   Comment 1 Notify RN   Glucose, capillary     Status: Abnormal   Collection Time: 07/17/15 11:23 AM  Result Value Ref Range   Glucose-Capillary 147 (H) 65 - 99 mg/dL   Comment 1 Notify RN     Dg Chest 2 View  07/15/2015  CLINICAL DATA:  Cough, fever, confusion. EXAM: CHEST  2 VIEW COMPARISON:  06/16/2015 FINDINGS: Multi lead left-sided pacemaker remains in place. Patient is post median sternotomy. Cardiomegaly is decreased in the interim. Residual left lower lobe atelectasis, with improved aeration compared to prior exam. Right basilar opacity is unchanged. There is no pleural effusion. No pneumothorax. IMPRESSION: Bibasilar opacities again seen, improved left lung base aeration from prior. While this may represent atelectasis, pneumonia or aspiration are not excluded based on radiographic features alone. Electronically Signed   By: Rubye OaksMelanie  Ehinger M.D.   On: 07/15/2015 20:19   Dg Hip Unilat With Pelvis 1v Left  07/17/2015  CLINICAL DATA:  Follow-up hip surgery November 2016 EXAM: DG HIP (WITH OR WITHOUT PELVIS) 1V*L* COMPARISON:  05/19/2015 FINDINGS: Recent left femoral neck  fracture with ORIF. No evidence of screw loosening or migration. Stable alignment of the fracture. No evidence of osteonecrosis. No significant or progressive degenerative changes.  Osteopenia. IMPRESSION: Recent left femoral neck fracture and ORIF. Stable appearance since November 2016. Electronically Signed   By: Marnee SpringJonathon  Watts M.D.   On: 07/17/2015 13:54    Assessment/Plan:     Active Problems:   Hyperkalemia   Acute kidney injury (HCC)   Sacral decubitus ulcer   Sepsis (HCC)  I reviewed the patient's left hip x-rays today. The percutaneous screws are well positioned. The fractures well reduced and appears to be healing well. There is no evidence of AVN. Change in fracture position. Patient may begin increasing to weightbearing as tolerated on the left lower extremity as his pain allows. Going to order physical therapy evaluation while he is an inpatient. [facility], discharge instructions should explained that he has now weightbearing as tolerated on the left lower extremity with his walker. Patient must use a walker when he is up ambulating. I will see him back in the office in approximately 4 weeks.    Juanell FairlyKRASINSKI, Seara Hinesley , MD 07/17/2015, 3:20 PM

## 2015-07-17 NOTE — Progress Notes (Signed)
ANTICOAGULATION CONSULT NOTE - Follow Up Consult  Pharmacy Consult for warfarin dosing Indication: atrial fibrillation  Allergies  Allergen Reactions  . Bactrim [Sulfamethoxazole-Trimethoprim] Other (See Comments)    Reaction:  Unknown     Patient Measurements: Height: 5\' 6"  (167.6 cm) Weight: 146 lb 14.4 oz (66.633 kg) IBW/kg (Calculated) : 63.8 Heparin Dosing Weight: n/a  Vital Signs: Temp: 98.4 F (36.9 C) (01/13 0952) Temp Source: Oral (01/13 0952) BP: 139/47 mmHg (01/13 0952) Pulse Rate: 64 (01/13 0952)  Labs:  Recent Labs  07/15/15 1947 07/16/15 0406 07/16/15 0759 07/17/15 0552  HGB 9.8*  --   --  9.3*  HCT 29.7*  --   --  28.0*  PLT 248  --   --  240  APTT 80*  --   --   --   LABPROT 34.5* 28.6*  --  19.7*  INR 3.52 2.74  --  1.67  CREATININE 1.60*  --  1.22 0.99    Estimated Creatinine Clearance: 51 mL/min (by C-G formula based on Cr of 0.99).   Medical History: Past Medical History  Diagnosis Date  . Diabetes mellitus without complication (HCC)   . Hypertension   . A-fib (HCC)   . GERD (gastroesophageal reflux disease)   . CHF (congestive heart failure) (HCC)   . Fracture, femur (HCC)     Medications:    Assessment: Hgb 9.8  INR 3.52 - warfarin dose held  1/12 AM INR 2.74 - warfarin 5 mg given 1/13 AM INR 1.67, Hgb 9.3, plts 240  Goal of Therapy:  INR 2-3 Monitor platelets by anticoagulation protocol: Yes   Plan:  Will continue home dose of warfarin 5 mg po daily as patient admitted with supratherapeutic INR and on antimicrobial therapy. At this point, patient has only received 1 dose of warfarin.  If INR <2 in AM, may consider increasing dose.   Pharmacy will continue to follow.  Kodee Ravert G 07/17/2015,10:45 AM

## 2015-07-17 NOTE — Progress Notes (Signed)
Pharmacy Antibiotic Follow-up Note  Riley BimlerMilton Fontanilla Jr. is a 82 y.o. year-old male admitted on 07/15/2015.  The patient is currently on day 3 of Vancomycin and Zosyn for HCAP.  Patient is also on Day 2 of Azithromycin.   Assessment/Plan: Will transition patient to Vancomycin 1 gm IV q18h based on current renal function.     Vancomycin parameters: ke 0.047, t1/2 14.7, Vd 45.4 L  Will transition patient to Zosyn 3.375 gm IV q8h as this is day 2 therapy and patient is not obese.    Temp (24hrs), Avg:98.3 F (36.8 C), Min:98.1 F (36.7 C), Max:98.4 F (36.9 C)   Recent Labs Lab 07/15/15 1947 07/17/15 0552  WBC 15.7* 9.2    Recent Labs Lab 07/15/15 1947 07/16/15 0759 07/17/15 0552  CREATININE 1.60* 1.22 0.99   Estimated Creatinine Clearance: 51 mL/min (by C-G formula based on Cr of 0.99).    Allergies  Allergen Reactions  . Bactrim [Sulfamethoxazole-Trimethoprim] Other (See Comments)    Reaction:  Unknown     Antimicrobials this admission: Levaquin 1/11>> 1/12 Azithromycin 1/12 >>  Vancomycin 1/11 >> Zosyn 1/11 >>  Levels/dose changes this admission: Will transition patient to Vancomycin 1000 mg IV q18h and order a trough prior to dose on 1/15 at 1430.  Will transition to Zosyn 3.375 gm IV q8h.  Microbiology results: 1/11 BCx: NGTD x2 1/13 Sputum: pending 1/12 MRSA PCR: negative  Thank you for allowing pharmacy to be a part of this patient's care.  Jaydien Panepinto G PharmD 07/17/2015 10:56 AM

## 2015-07-17 NOTE — Progress Notes (Signed)
[facility] - [place] at [facility]   PATIENT NAME: Riley Sanchez    MR#:  [id number]  DATE OF BIRTH:  06-17-1930  SUBJECTIVE:  CHIEF COMPLAINT:   Chief Complaint  Patient presents with  . Fever   The patient is a 82 year old Caucasian male who presents to the hospital with complaints of worsening cough, yellow phlegm, fevers and chills, weakness. In emergency room, he was noted to be hypoxic, was noted to have elevated potassium level, which was treated with insulin, calcium gluconate and IV fluids and improved. , no nausea, vomiting, diarrhea noted. The patient is more alert today and feels more comfortable stronger. Intermittent dry cough. Chest x-ray revealed bilateral opacities concerning for pneumonia. Blood cultures 2 are negative, MRSA PCR is negative. Sputum culture is pending, C. difficile is negative Review of Systems  Unable to perform ROS: mental status change    VITAL SIGNS: Blood pressure 127/44, pulse 76, temperature 99.7 F (37.6 C), temperature source Oral, resp. rate 16, height 5\' 6"  (1.676 m), weight 66.633 kg (146 lb 14.4 oz), SpO2 95 %.  PHYSICAL EXAMINATION:   GENERAL:  82 y.o.-year-old patient lying in the bed in mild respiratory distress, more alert though able to move her in the bed and communicate, cannot turn to the side.  Confused EYES: Pupils equal, round, reactive to light and accommodation. No scleral icterus. Extraocular muscles intact.  HEENT: Head atraumatic, normocephalic. Oropharynx and nasopharynx clear.  NECK:  Supple, no jugular venous distention. No thyroid enlargement, no tenderness.  LUNGSDiminished breath   sounds bilaterally, scattered  bilateral basilar rales,rhonchi or crepitations noted, intermittent use of accessory muscles of respiration, mostly with exertion, speech .  CARDIOVASCULAR: S1, S2 normal. No murmurs, rubs, or gallops.  ABDOMEN: Soft, nontender, nondistended. Bowel sounds present. No organomegaly or  mass.  EXTREMITIES: No pedal edema, cyanosis, or clubbing.  NEUROLOGIC: Cranial nerves II through XII are intact. Muscle strength 3/5 in all extremities, locally week. Sensation intact. Gait not checked.  PSYCHIATRIC: The patient is more alert, remains very weak SKIN: No obvious rash, lesion, or ulcer.   ORDERS/RESULTS REVIEWED:   CBC  Recent Labs Lab 07/15/15 1947 07/17/15 0552  WBC 15.7* 9.2  HGB 9.8* 9.3*  HCT 29.7* 28.0*  PLT 248 240  MCV 87.6 88.3  MCH 29.0 29.5  MCHC 33.0 33.4  RDW 15.0* 14.9*  LYMPHSABS 1.4  --   MONOABS 0.8  --   EOSABS 0.1  --   BASOSABS 0.0  --    ------------------------------------------------------------------------------------------------------------------  Chemistries   Recent Labs Lab 07/15/15 1947 07/15/15 2044 07/16/15 0759 07/17/15 0552  NA 135  --  138 138  K 6.2* 6.4* 4.3 3.5  CL 110  --  113* 108  CO2 22  --  22 21*  GLUCOSE 128*  --  125* 104*  BUN 43*  --  31* 17  CREATININE 1.60*  --  1.22 0.99  CALCIUM 9.1  --  8.5* 8.5*  AST 14*  --   --   --   ALT 13*  --   --   --   ALKPHOS 70  --   --   --   BILITOT 0.8  --   --   --    ------------------------------------------------------------------------------------------------------------------ estimated creatinine clearance is 51 mL/min (by C-G formula based on Cr of 0.99). ------------------------------------------------------------------------------------------------------------------ No results for input(s): TSH, T4TOTAL, T3FREE, THYROIDAB in the last 72 hours.  Invalid input(s): FREET3  Cardiac Enzymes No results for input(s):  CKMB, TROPONINI, MYOGLOBIN in the last 168 hours.  Invalid input(s): CK ------------------------------------------------------------------------------------------------------------------ Invalid input(s): POCBNP ---------------------------------------------------------------------------------------------------------------  RADIOLOGY: Dg  Chest 2 View  07/15/2015  CLINICAL DATA:  Cough, fever, confusion. EXAM: CHEST  2 VIEW COMPARISON:  06/16/2015 FINDINGS: Multi lead left-sided pacemaker remains in place. Patient is post median sternotomy. Cardiomegaly is decreased in the interim. Residual left lower lobe atelectasis, with improved aeration compared to prior exam. Right basilar opacity is unchanged. There is no pleural effusion. No pneumothorax. IMPRESSION: Bibasilar opacities again seen, improved left lung base aeration from prior. While this may represent atelectasis, pneumonia or aspiration are not excluded based on radiographic features alone. Electronically Signed   By: Rubye OaksMelanie  Ehinger M.D.   On: 07/15/2015 20:19   Dg Hip Unilat With Pelvis 1v Left  07/17/2015  CLINICAL DATA:  Follow-up hip surgery November 2016 EXAM: DG HIP (WITH OR WITHOUT PELVIS) 1V*L* COMPARISON:  05/19/2015 FINDINGS: Recent left femoral neck fracture with ORIF. No evidence of screw loosening or migration. Stable alignment of the fracture. No evidence of osteonecrosis. No significant or progressive degenerative changes.  Osteopenia. IMPRESSION: Recent left femoral neck fracture and ORIF. Stable appearance since November 2016. Electronically Signed   By: Marnee SpringJonathon  Watts M.D.   On: 07/17/2015 13:54    EKG:  Orders placed or performed during the hospital encounter of 07/15/15  . EKG 12-Lead  . EKG 12-Lead    ASSESSMENT AND PLAN:  Active Problems:   Hyperkalemia   Acute kidney injury (HCC)   Sacral decubitus ulcer   Sepsis (HCC) 1. Sepsis due to bacterial pneumonia , continue intravenous therapy with broad-spectrum antibiotics, blood cultures are negative. MRSA PCR screening negative , awaiting for sputum cultures results  2. COPD exacerbation due to pneumonia , continue DuoNeb nebs , steroid inhaler , continue oxygen as needed. 7. Improved clinically   3 . Acute renal insufficiency , resolved with IV fluid administration , follow labs.  4. Hyperkalemia,  resolved with multiple medications, IV fluids  5. Leukocytosis , resolved with antibiotic therapy. Blood cultures are negative, base antibiotic therapy depending on sputum cultures 6 . Diarrheal stool, per report, C. difficile negative, no stool. Last bowel movement was reported on January 12.     Management plans discussed with the patient, family and they are in agreement.   DRUG ALLERGIES:  Allergies  Allergen Reactions  . Bactrim [Sulfamethoxazole-Trimethoprim] Other (See Comments)    Reaction:  Unknown     CODE STATUS:     Code Status Orders        Start     Ordered   07/15/15 2304  Full code   Continuous     07/15/15 2305    Code Status History    Date Active Date Inactive Code Status Order ID Comments User Context   06/14/2015  3:10 AM 06/19/2015  6:54 PM Full Code [id number]  Katharina Caperima Khairi Garman, MD Inpatient   05/19/2015  2:07 PM 05/22/2015  5:04 PM Full Code [id number]  Juanell FairlyKevin Krasinski, MD Inpatient   05/18/2015  5:32 PM 05/19/2015  2:07 PM Full Code [id number]  Enedina FinnerSona Patel, MD Inpatient   05/18/2015  5:18 PM 05/18/2015  5:32 PM Full Code [id number]  Juanell FairlyKevin Krasinski, MD ED      TOTAL TIME TAKING CARE OF THIS PATI40 minutes.   discussed with patient's wife   Alroy Portela M.D on 07/17/2015 at 2:59 PM  Between 7am to 6pm - Pager - [phone]  After 6pm go to [email or web address] - password [email or web address] [email or web address]  Office  [phone]  CC: Primary care physician; Sula Rumple, MD

## 2015-07-18 LAB — GLUCOSE, CAPILLARY
GLUCOSE-CAPILLARY: 211 mg/dL — AB (ref 65–99)
Glucose-Capillary: 141 mg/dL — ABNORMAL HIGH (ref 65–99)
Glucose-Capillary: 150 mg/dL — ABNORMAL HIGH (ref 65–99)

## 2015-07-18 LAB — PROTIME-INR
INR: 1.57
PROTHROMBIN TIME: 18.8 s — AB (ref 11.4–15.0)

## 2015-07-18 MED ORDER — WARFARIN SODIUM 6 MG PO TABS
6.0000 mg | ORAL_TABLET | Freq: Every day | ORAL | Status: DC
  Administered 2015-07-18 – 2015-07-19 (×2): 6 mg via ORAL
  Filled 2015-07-18 (×2): qty 1

## 2015-07-18 MED ORDER — PREDNISONE 50 MG PO TABS
50.0000 mg | ORAL_TABLET | Freq: Every day | ORAL | Status: DC
  Administered 2015-07-18 – 2015-07-20 (×3): 50 mg via ORAL
  Filled 2015-07-18 (×3): qty 1

## 2015-07-18 NOTE — Progress Notes (Signed)
Physical Therapy Treatment Patient Details Name: Riley BimlerMilton Mccaslin Jr. MRN: [id number] DOB: 24-Aug-1929 Today's Date: 07/18/2015    History of Present Illness Patient is an 82 y/o male presenting from [facility] with worsening cough, fevers, weakness. L femoral neck fx on 05/19/2015 with percutaneus screw had been NWB, however per ortho note he is now considered WBAT.     PT Comments    Pt able to progress with bed mobility and transfers this session requiring less physical assist.  Pt Mod A for supine to sit transfer and Max A +2 for sit to supine transfer.  Pt was able to accept weight on L LE and shift his weight forward in standing and was able to maintain midline standing for 10 seconds with CGA.  Pt able to take small shuffled steps to the side and backward to position self for return to bed.  Pt fatigued at end of session with overall good motivation and participation with therapy.  Cont with POC.   Follow Up Recommendations  SNF    Equipment Recommendations       Recommendations for Other Services       Precautions / Restrictions Precautions Precautions: Fall Restrictions LLE Weight Bearing: Weight bearing as tolerated    Mobility  Bed Mobility Overal bed mobility: Needs Assistance;+2 for physical assistance Bed Mobility: Supine to Sit;Sit to Supine     Supine to sit: Mod assist Sit to supine: Max assist;+2 for physical assistance   General bed mobility comments: Mod A for supine to sit with HOB elevated with Physical assist to rotate hips to edge of bed; extra time with small weight shifts to complete.  Transfers Overall transfer level: Needs assistance Equipment used: Rolling walker (2 wheeled) Transfers: Sit to/from Stand Sit to Stand: Mod assist;+2 physical assistance         General transfer comment: Mod A +2 for sit<>stand trasfer with Mod A for initial balance and reduced to CGA for about 10 seconds with focus on anterior weight translation through UE's on RW.     Ambulation/Gait Ambulation/Gait assistance: Max assist;+2 physical assistance Ambulation Distance (Feet): 1 Feet Assistive device: Rolling walker (2 wheeled) Gait Pattern/deviations: Shuffle     General Gait Details: 3 side steps and 2 backward steps with phsical assist to move R LE with increased posterior lean and fear throughout.  Pt able to accept weight on L LE, but had significant weakness with knee buckling.   Stairs            Wheelchair Mobility    Modified Rankin (Stroke Patients Only)       Balance Overall balance assessment: Needs assistance Sitting-balance support: Bilateral upper extremity supported;Feet supported Sitting balance-Leahy Scale: Fair   Postural control: Posterior lean Standing balance support: Bilateral upper extremity supported Standing balance-Leahy Scale: Poor                      Cognition Arousal/Alertness: Awake/alert Behavior During Therapy: WFL for tasks assessed/performed Overall Cognitive Status: History of cognitive impairments - at baseline                      Exercises Other Exercises Other Exercises: Balance: static and dynamic sitting balance at EOB while donning and doffing hospital gown, anterior weight shifting in sitting pushing with B UE's on bed to a partial stand x 5 reps; Standing: anterior weight shifts through UE's onto  RW x 5 reps.    General Comments  Pertinent Vitals/Pain Pain Assessment: No/denies pain    Home Living                      Prior Function            PT Goals (current goals can now be found in the care plan section) Acute Rehab PT Goals Patient Stated Goal: To walk again PT Goal Formulation: With patient Time For Goal Achievement: 08/18/15 Potential to Achieve Goals: Fair    Frequency  Min 2X/week    PT Plan Current plan remains appropriate    Co-evaluation             End of Session Equipment Utilized During Treatment: Gait  belt Activity Tolerance: Patient tolerated treatment well;Patient limited by fatigue Patient left: in bed;with call bell/phone within reach;with bed alarm set;with family/visitor present     Time: 1610-9604 PT Time Calculation (min) (ACUTE ONLY): 36 min  Charges:  $Therapeutic Exercise: 8-22 mins $Therapeutic Activity: 8-22 mins                    G Codes:      Oz Gammel A Rayansh Herbst Aug 05, 2015, 2:49 PM

## 2015-07-18 NOTE — Progress Notes (Signed)
[facility] [place] [facility]   PATIENT NAME: Riley CruiseMilton Helbig    MR#:  [id number]  DATE OF BIRTH:  28-Jun-1930  SUBJECTIVE:  CHIEF COMPLAINT:   Chief Complaint  Patient presents with  . Fever   The patient is a 82 year old Caucasian male who presents to the hospital with complaints of cough, yellow phlegm, fevers and chills, weakness. In emergency room, he was noted to be hypoxic, was noted to have elevated potassium level, which was treated with insulin, calcium gluconate and IV fluids and improved. , no nausea, vomiting, diarrhea noted. The patient is more alert today and feels m stronger. Intermittent dry cough. Chest x-ray revealed bilateral opacities concerning for pneumonia. Blood cultures 2 are negative, MRSA PCR is negative. Sputum culture is pending, C. difficile is negative Review of Systems  Unable to perform ROS: mental status change    VITAL SIGNS: Blood pressure 132/54, pulse 49, temperature 97.7 F (36.5 C), temperature source Oral, resp. rate 18, height 5\' 6"  (1.676 m), weight 66.633 kg (146 lb 14.4 oz), SpO2 95 %.  PHYSICAL EXAMINATION:   GENERAL:  82 y.o.-year-old patient lying in the bed in mild respiratory distress, more alert , talks.   Confused EYES: Pupils equal, round, reactive to light and accommodation. No scleral icterus. Extraocular muscles intact.  HEENT: Head atraumatic, normocephalic. Oropharynx and nasopharynx clear.  NECK:  Supple, no jugular venous distention. No thyroid enlargement, no tenderness.  LUNGSDiminished breath   sounds bilaterally, no  rales,rhonchi or crepitations noted, intermittent use of accessory muscles of respiration, mostly with exertion, speech .  CARDIOVASCULAR: S1, S2 normal. No murmurs, rubs, or gallops.  ABDOMEN: Soft, nontender, nondistended. Bowel sounds present. No organomegaly or mass.  EXTREMITIES: No pedal edema, cyanosis, or clubbing.  NEUROLOGIC: Cranial nerves II through XII are intact.  Muscle strength 3/5 in all extremities, locally week. Sensation intact. Gait not checked.  PSYCHIATRIC: The patient is more alert, remains very weak.   SKIN: No obvious rash, lesion, or ulcer.   ORDERS/RESULTS REVIEWED:   CBC  Recent Labs Lab 07/15/15 1947 07/17/15 0552  WBC 15.7* 9.2  HGB 9.8* 9.3*  HCT 29.7* 28.0*  PLT 248 240  MCV 87.6 88.3  MCH 29.0 29.5  MCHC 33.0 33.4  RDW 15.0* 14.9*  LYMPHSABS 1.4  --   MONOABS 0.8  --   EOSABS 0.1  --   BASOSABS 0.0  --    ------------------------------------------------------------------------------------------------------------------  Chemistries   Recent Labs Lab 07/15/15 1947 07/15/15 2044 07/16/15 0759 07/17/15 0552  NA 135  --  138 138  K 6.2* 6.4* 4.3 3.5  CL 110  --  113* 108  CO2 22  --  22 21*  GLUCOSE 128*  --  125* 104*  BUN 43*  --  31* 17  CREATININE 1.60*  --  1.22 0.99  CALCIUM 9.1  --  8.5* 8.5*  AST 14*  --   --   --   ALT 13*  --   --   --   ALKPHOS 70  --   --   --   BILITOT 0.8  --   --   --    ------------------------------------------------------------------------------------------------------------------ estimated creatinine clearance is 51 mL/min (by C-G formula based on Cr of 0.99). ------------------------------------------------------------------------------------------------------------------ No results for input(s): TSH, T4TOTAL, T3FREE, THYROIDAB in the last 72 hours.  Invalid input(s): FREET3  Cardiac Enzymes No results for input(s): CKMB, TROPONINI, MYOGLOBIN in the last 168 hours.  Invalid input(s): CK ------------------------------------------------------------------------------------------------------------------ Invalid  input(s): POCBNP ---------------------------------------------------------------------------------------------------------------  RADIOLOGY: Dg Hip Unilat With Pelvis 1v Left  07/17/2015  CLINICAL DATA:  Follow-up hip surgery November 2016 EXAM: DG HIP (WITH  OR WITHOUT PELVIS) 1V*L* COMPARISON:  05/19/2015 FINDINGS: Recent left femoral neck fracture with ORIF. No evidence of screw loosening or migration. Stable alignment of the fracture. No evidence of osteonecrosis. No significant or progressive degenerative changes.  Osteopenia. IMPRESSION: Recent left femoral neck fracture and ORIF. Stable appearance since November 2016. Electronically Signed   By: Marnee Spring M.D.   On: 07/17/2015 13:54    EKG:  Orders placed or performed during the hospital encounter of 07/15/15  . EKG 12-Lead  . EKG 12-Lead    ASSESSMENT AND PLAN:  Active Problems:   Hyperkalemia   Acute kidney injury (HCC)   Sacral decubitus ulcer   Sepsis (HCC)   Protein-calorie malnutrition, severe 1. Sepsis due to bacterial pneumonia , continue intravenous therapy with zosyn/zithromax, blood cultures are negative. MRSA PCR screening negative , awaiting for sputum culture results . Patient was seen by SLP and recommended dysphagia 3 diet with thin liquids 2. COPD exacerbation due to pneumonia , continue DuoNeb nebs , steroid inhaler , continue oxygen as needed. Add prednisone PO and robitussin   3 . Acute renal insufficiency , resolved with IV fluid administration , follow labs.  4. Hyperkalemia, resolved with multiple medications, IV fluids  5. Leukocytosis , resolved with antibiotic therapy. Blood cultures are negative, base antibiotic therapy depending on sputum cultures 6 . Diarrheal stool, per report, C. difficile negative, no loose stool. Last bowel movement was reported on January 12.  7 sacral decubitus, care per protocol, PT 8 recent L femoral neck fx and s/p ORIF by DR Martha Clan, PT and weight bearing as tolerated, per ortho   Management plans discussed with the patient, family and they are in agreement.   DRUG ALLERGIES:  Allergies  Allergen Reactions  . Bactrim [Sulfamethoxazole-Trimethoprim] Other (See Comments)    Reaction:  Unknown     CODE STATUS:      Code Status Orders        Start     Ordered   07/15/15 2304  Full code   Continuous     07/15/15 2305    Code Status History    Date Active Date Inactive Code Status Order ID Comments User Context   06/14/2015  3:10 AM 06/19/2015  6:54 PM Full Code [id number]  Katharina Caper, MD Inpatient   05/19/2015  2:07 PM 05/22/2015  5:04 PM Full Code [id number]  Juanell Fairly, MD Inpatient   05/18/2015  5:32 PM 05/19/2015  2:07 PM Full Code [id number]  Enedina Finner, MD Inpatient   05/18/2015  5:18 PM 05/18/2015  5:32 PM Full Code [id number]  Juanell Fairly, MD ED      TOTAL TIME TAKING CARE OF THIS PATIent 40 minutes.    discussed with DR Kari Baars M.D on 07/18/2015 at 2:12 PM  Between 7am to 6pm - Pager - [phone]  After 6pm go to [email or web address] - password [email or web address] [facility]  [place] [facility]  Office  [phone]  CC: Primary care physician; Sula Rumple, MD

## 2015-07-18 NOTE — Progress Notes (Signed)
ANTICOAGULATION CONSULT NOTE - Follow Up Consult  Pharmacy Consult for warfarin dosing Indication: atrial fibrillation  Allergies  Allergen Reactions  . Bactrim [Sulfamethoxazole-Trimethoprim] Other (See Comments)    Reaction:  Unknown     Patient Measurements: Height: 5\' 6"  (167.6 cm) Weight: 146 lb 14.4 oz (66.633 kg) IBW/kg (Calculated) : 63.8 Heparin Dosing Weight: n/a  Vital Signs: Temp: 98.1 F (36.7 C) (01/13 1956) Temp Source: Oral (01/13 1956) BP: 115/58 mmHg (01/13 1956) Pulse Rate: 67 (01/13 1956)  Labs:  Recent Labs  07/15/15 1947 07/16/15 0406 07/16/15 0759 07/17/15 0552 07/18/15 0429  HGB 9.8*  --   --  9.3*  --   HCT 29.7*  --   --  28.0*  --   PLT 248  --   --  240  --   APTT 80*  --   --   --   --   LABPROT 34.5* 28.6*  --  19.7* 18.8*  INR 3.52 2.74  --  1.67 1.57  CREATININE 1.60*  --  1.22 0.99  --     Estimated Creatinine Clearance: 51 mL/min (by C-G formula based on Cr of 0.99).   Medical History: Past Medical History  Diagnosis Date  . Diabetes mellitus without complication (HCC)   . Hypertension   . A-fib (HCC)   . GERD (gastroesophageal reflux disease)   . CHF (congestive heart failure) (HCC)   . Fracture, femur (HCC)     Medications:    Assessment: Hgb 9.8  INR 3.52 - warfarin dose held  1/12 AM INR 2.74 - warfarin 5 mg given 1/13 AM INR 1.67, Hgb 9.3, plts 240 1/14 AM INR 1.57. Increase to 6 mg daily.  Goal of Therapy:  INR 2-3 Monitor platelets by anticoagulation protocol: Yes   Plan:  Will continue home dose of warfarin 5 mg po daily as patient admitted with supratherapeutic INR and on antimicrobial therapy. At this point, patient has only received 1 dose of warfarin.  If INR <2 in AM, may consider increasing dose.   Pharmacy will continue to follow.  Xavious Sharrar S 07/18/2015,5:53 AM

## 2015-07-18 NOTE — Evaluation (Signed)
Clinical/Bedside Swallow Evaluation Patient Details  Name: Riley BimlerMilton Rini Jr. MRN: [id number] Date of Birth: [birth year]  Today's Date: 07/18/2015 Time: SLP Start Time (ACUTE ONLY): 1120 SLP Stop Time (ACUTE ONLY): 1220 SLP Time Calculation (min) (ACUTE ONLY): 60 min  Past Medical History:  Past Medical History  Diagnosis Date  . Diabetes mellitus without complication (HCC)   . Hypertension   . A-fib (HCC)   . GERD (gastroesophageal reflux disease)   . CHF (congestive heart failure) (HCC)   . Fracture, femur [facility](HCC)    Past Surgical History:  Past Surgical History  Procedure Laterality Date  . Cardiac surgery      bypass in 87  . Cardiac defibrillator placement      replaced 1/16, medtronic  . Cardiac defibrillator placement      medtronic 1/16  . Hip pinning,cannulated Left 05/19/2015    Procedure: CANNULATED HIP PINNING;  Surgeon: Juanell FairlyKevin Krasinski, MD;  Location: [facility] ORS;  Service: Orthopedics;  Laterality: Left;   HPI:  Pt is a 82 y.o. male with a known history of HTN, afib, CHF, type 2 diabetes not insulin requiring who is presenting with fever/cough. He is currently in a nursing facility after recently having hip surgery. He also of note had a recent bout of pneumonia post N/V and suspected aspiration of vomit and has finished those antibiotics. Since the time of surgery in last few days, he has developed worsening cough productive of yellowish sputum subjective fevers, chills. Of note he is bedbound since his recent surgery and was due to see orthopedic surgery 3 days for to see if he can tolerate weightbearing. Upon arrival to the emergency department, he was noted to have an elevated potassium level also be hypoxic. Pt is currently resting well and denied any s/s of dysphagia or aspiration; wife agreed. Pt does appear weak and stated his appetite has been "less". Wife has been assisting in feeding him and he does tell the wife to slow down d/t "feeding me too fast". Suspct pt may  be describing s/s of Esophageal dysmotility. Pt stated he has tolerated his current diet well.    Assessment / Plan / Recommendation Clinical Impression  Pt appears to adequately tolerate trials of thin liquids and purees/soft solids w/ no overt s/s of aspriation noted; no oropharyngeal phase deficits noted w/ trials as well. Pt consumed trials provided w/ no decline in respiratory status and no change in vocal quality noted. Pt was able to feed self given support and finger foods. This may help w/ his own monitoring of Esophageal motility and feelings of fullness and knowing when to take rest breaks. Discussed Reflux precautions w/ pt and wife; general aspiration precautions too. Pt appears at reduced risk for aspiration from an oropharyngeal perspective; min. increased risk from retrograde activity of Reflux is possible. Rec. a dys. 3 diet w/ thin liquids; moistened foods; Reflux and aspiration precautions; meds in Puree - whole if nec. Support at meals; rest breaks to allow for digestion. Education given to wife and pt on precautions and food options/prep. NSG updated. No further skilled ST services indicated at this time. NSG to reconsult if any change in status. Pt and wife agreed.    Aspiration Risk   (reduced w/ precautions)    Diet Recommendation  Dys. 3 w/ thin liquids; well-moistened foods; aspiration precautions; Reflux precautions  Medication Administration: Whole meds with puree    Other  Recommendations Recommended Consults: Consider GI evaluation (Dietician consult) Oral Care Recommendations: Oral care BID;Staff/trained caregiver  to provide oral care   Follow up Recommendations  None    Frequency and Duration            Prognosis Prognosis for Safe Diet Advancement: Good      Swallow Study   General Date of Onset: 07/15/15 HPI: Pt is a 82 y.o. male with a known history of HTN, afib, CHF, type 2 diabetes not insulin requiring who is presenting with fever/cough. He is  currently in a nursing facility after recently having hip surgery. He also of note had a recent bout of pneumonia post N/V and suspected aspiration of vomit and has finished those antibiotics. Since the time of surgery in last few days, he has developed worsening cough productive of yellowish sputum subjective fevers, chills. Of note he is bedbound since his recent surgery and was due to see orthopedic surgery 3 days for to see if he can tolerate weightbearing. Upon arrival to the emergency department, he was noted to have an elevated potassium level also be hypoxic. Pt is currently resting well and denied any s/s of dysphagia or aspiration; wife agreed. Pt does appear weak and stated his appetite has been "less". Wife has been assisting in feeding him and he does tell the wife to slow down d/t "feeding me too fast". Suspct pt may be describing s/s of Esophageal dysmotility. Pt stated he has tolerated his current diet well.  Type of Study: Bedside Swallow Evaluation Previous Swallow Assessment: 06/2015 Diet Prior to this Study: Regular;Thin liquids Temperature Spikes Noted: No (wbc 9.2) Respiratory Status: Nasal cannula (2 liters) History of Recent Intubation: No Behavior/Cognition: Alert;Cooperative;Pleasant mood Oral Cavity Assessment: Within Functional Limits Oral Care Completed by SLP: Recent completion by staff Oral Cavity - Dentition: Adequate natural dentition Vision: Functional for self-feeding Self-Feeding Abilities: Able to feed self;Needs assist;Needs set up (weakness) Patient Positioning: Upright in bed Baseline Vocal Quality: Normal Volitional Cough: Strong Volitional Swallow: Able to elicit    Oral/Motor/Sensory Function Overall Oral Motor/Sensory Function: Within functional limits   Ice Chips Ice chips: Within functional limits Presentation: Spoon (fed; 3 trials)   Thin Liquid Thin Liquid: Within functional limits Presentation: Cup;Self Fed;Straw (8-9 trials)    Nectar Thick  Nectar Thick Liquid: Not tested   Honey Thick Honey Thick Liquid: Not tested   Puree Puree: Within functional limits Presentation: Spoon;Self Fed (3 trials)   Solid   GO   Solid: Within functional limits Presentation: Self Fed (3 trials (finger food))        Jerilynn Som, MS, CCC-SLP  Valeria Boza 07/18/2015,1:59 PM

## 2015-07-18 NOTE — Progress Notes (Signed)
Patient alert and oriented x2, no complaints at this time. vss at this time. Patient NSR on telemetry. Will continue to assess. Trudee KusterBrandi R Mansfield

## 2015-07-19 LAB — GLUCOSE, CAPILLARY
GLUCOSE-CAPILLARY: 189 mg/dL — AB (ref 65–99)
GLUCOSE-CAPILLARY: 238 mg/dL — AB (ref 65–99)
Glucose-Capillary: 267 mg/dL — ABNORMAL HIGH (ref 65–99)
Glucose-Capillary: 239 mg/dL — ABNORMAL HIGH (ref 65–99)

## 2015-07-19 LAB — PROTIME-INR
INR: 1.71
Prothrombin Time: 20.1 seconds — ABNORMAL HIGH (ref 11.4–15.0)

## 2015-07-19 MED ORDER — SENNA 8.6 MG PO TABS: 1.0000 | ORAL_TABLET | Freq: Every day | ORAL | Status: DC | PRN

## 2015-07-19 MED ORDER — DOCUSATE SODIUM 100 MG PO CAPS
100.0000 mg | ORAL_CAPSULE | Freq: Two times a day (BID) | ORAL | Status: DC
  Administered 2015-07-19 (×2): 100 mg via ORAL
  Filled 2015-07-19 (×3): qty 1

## 2015-07-19 MED ORDER — WARFARIN - PHARMACIST DOSING INPATIENT
Freq: Every day | Status: DC
  Administered 2015-07-19: 19:00:00

## 2015-07-19 NOTE — Progress Notes (Signed)
[facility] - [place] at [facility]   PATIENT NAME: Riley Sanchez    MR#:  [id number]  DATE OF BIRTH:  04-04-1930  SUBJECTIVE:  CHIEF COMPLAINT:   Chief Complaint  Patient presents with  . Fever   The patient is a 82 year old Caucasian male who presents to the hospital with complaints of cough, yellow phlegm, fevers and chills, weakness. In emergency room, he was noted to be hypoxic, was noted to have elevated potassium level, which was treated with insulin, calcium gluconate and IV fluids and improved. , no nausea, vomiting, diarrhea noted. The patient is more alert today and feels m stronger. Intermittent dry cough. Chest x-ray revealed bilateral opacities concerning for pneumonia. Blood cultures 2 are negative, MRSA PCR is negative. Sputum culture is not reported, C. difficile is negative Review of Systems  Unable to perform ROS: mental status change    VITAL SIGNS: Blood pressure 135/48, pulse 78, temperature 97.6 F (36.4 C), temperature source Oral, resp. rate 20, height 5\' 6"  (1.676 m), weight 66.633 kg (146 lb 14.4 oz), SpO2 94 %.  PHYSICAL EXAMINATION:   GENERAL:  82 y.o.-year-old patient lying in the bed in mild respiratory distress, more alert , talks.   Comfortable, oral intake is poor EYES: Pupils equal, round, reactive to light and accommodation. No scleral icterus. Extraocular muscles intact.  HEENT: Head atraumatic, normocephalic. Oropharynx and nasopharynx clear.  NECK:  Supple, no jugular venous distention. No thyroid enlargement, no tenderness.  LUNGSDiminished breath   sounds bilaterally, scattered rales,rhonchi and crepitations noted. Both lung fields, intermittent use of accessory muscles of respiration, mostly with exertion, speech .  CARDIOVASCULAR: S1, S2 normal. No murmurs, rubs, or gallops.  ABDOMEN: Soft, nontender, nondistended. Bowel sounds present. No organomegaly or mass.  EXTREMITIES: No pedal edema, cyanosis, or clubbing.   NEUROLOGIC: Cranial nerves II through XII are intact. Muscle strength 3/5 in all extremities, locally week. Sensation intact. Gait not checked.  PSYCHIATRIC: The patient is more alert, remains very weak.   SKIN: No obvious rash, lesion, or ulcer.   ORDERS/RESULTS REVIEWED:   CBC  Recent Labs Lab 07/15/15 1947 07/17/15 0552  WBC 15.7* 9.2  HGB 9.8* 9.3*  HCT 29.7* 28.0*  PLT 248 240  MCV 87.6 88.3  MCH 29.0 29.5  MCHC 33.0 33.4  RDW 15.0* 14.9*  LYMPHSABS 1.4  --   MONOABS 0.8  --   EOSABS 0.1  --   BASOSABS 0.0  --    ------------------------------------------------------------------------------------------------------------------  Chemistries   Recent Labs Lab 07/15/15 1947 07/15/15 2044 07/16/15 0759 07/17/15 0552  NA 135  --  138 138  K 6.2* 6.4* 4.3 3.5  CL 110  --  113* 108  CO2 22  --  22 21*  GLUCOSE 128*  --  125* 104*  BUN 43*  --  31* 17  CREATININE 1.60*  --  1.22 0.99  CALCIUM 9.1  --  8.5* 8.5*  AST 14*  --   --   --   ALT 13*  --   --   --   ALKPHOS 70  --   --   --   BILITOT 0.8  --   --   --    ------------------------------------------------------------------------------------------------------------------ estimated creatinine clearance is 51 mL/min (by C-G formula based on Cr of 0.99). ------------------------------------------------------------------------------------------------------------------ No results for input(s): TSH, T4TOTAL, T3FREE, THYROIDAB in the last 72 hours.  Invalid input(s): FREET3  Cardiac Enzymes No results for input(s): CKMB, TROPONINI, MYOGLOBIN in the last 168  hours.  Invalid input(s): CK ------------------------------------------------------------------------------------------------------------------ Invalid input(s): POCBNP ---------------------------------------------------------------------------------------------------------------  RADIOLOGY: No results found.  EKG:  Orders placed or performed during  the hospital encounter of 07/15/15  . EKG 12-Lead  . EKG 12-Lead    ASSESSMENT AND PLAN:  Active Problems:   Hyperkalemia   Acute kidney injury (HCC)   Sacral decubitus ulcer   Sepsis (HCC)   Protein-calorie malnutrition, severe 1. Sepsis due to bacterial pneumonia , continue intravenous therapy with zosyn/zithromax, blood cultures are negative. MRSA PCR screening negative , awaiting for sputum culture results . Patient was seen by SLP and recommended dysphagia 3 diet with thin liquids 2. COPD exacerbation due to pneumonia , continue DuoNeb nebs , steroid inhaler , continue oxygen as needed. Added prednisone PO yesterday and robitussin, clinically some improved   3 . Acute renal insufficiency , resolved with IV fluid administration , follow labs tomorrow morning.  4. Hyperkalemia, resolved with multiple medications, IV fluids  5. Leukocytosis , resolved with antibiotic therapy. Blood cultures are negative, base antibiotic therapy depending on sputum culture, which is still pending 6 . Diarrheal stool, per report, C. difficile negative, no loose stool. Last bowel movement was reported on January 12.  7 sacral decubitus, care per protocol, PT 8 recent L femoral neck fx and s/p ORIF by DR Martha ClanKrasinski, PT and weight bearing as tolerated, per ortho 9. Failure to thrive, adult, follow oral intake closely. Initiate calorie count. If needed. Discussed with patient's wife   Management plans discussed with the patient, family and they are in agreement.   DRUG ALLERGIES:  Allergies  Allergen Reactions  . Bactrim [Sulfamethoxazole-Trimethoprim] Other (See Comments)    Reaction:  Unknown     CODE STATUS:     Code Status Orders        Start     Ordered   07/15/15 2304  Full code   Continuous     07/15/15 2305    Code Status History    Date Active Date Inactive Code Status Order ID Comments User Context   06/14/2015  3:10 AM 06/19/2015  6:54 PM Full Code [id number]  Katharina Caperima Kona Yusuf, MD  Inpatient   05/19/2015  2:07 PM 05/22/2015  5:04 PM Full Code [id number]  Juanell FairlyKevin Krasinski, MD Inpatient   05/18/2015  5:32 PM 05/19/2015  2:07 PM Full Code [id number]  Enedina FinnerSona Patel, MD Inpatient   05/18/2015  5:18 PM 05/18/2015  5:32 PM Full Code [id number]  Juanell FairlyKevin Krasinski, MD ED      TOTAL TIME TAKING CARE OF THIS PATIent . 30 minutes.    discussed with DR Kari BaarsKrasinski yesterday  Indy Kuck M.D on 07/19/2015 at 1:59 PM  Between 7am to 6pm - Pager - [phone]  After 6pm go to [email or web address] - password [email or web address] [facility]  [facility]  Office  [phone]  CC: Primary care physician; Sula RumpleVirk, Charanjit, MD

## 2015-07-19 NOTE — Progress Notes (Signed)
ANTICOAGULATION CONSULT NOTE - Follow Up Consult  Pharmacy Consult for warfarin dosing Indication: atrial fibrillation  Allergies  Allergen Reactions  . Bactrim [Sulfamethoxazole-Trimethoprim] Other (See Comments)    Reaction:  Unknown     Patient Measurements: Height: 5\' 6"  (167.6 cm) Weight: 146 lb 14.4 oz (66.633 kg) IBW/kg (Calculated) : 63.8 Heparin Dosing Weight: n/a  Vital Signs: Temp: 97.6 F (36.4 C) (01/15 0601) Temp Source: Oral (01/15 0601) BP: 126/48 mmHg (01/15 0601) Pulse Rate: 69 (01/15 0601)  Labs:  Recent Labs  07/17/15 0552 07/18/15 0429 07/19/15 0443  HGB 9.3*  --   --   HCT 28.0*  --   --   PLT 240  --   --   LABPROT 19.7* 18.8* 20.1*  INR 1.67 1.57 1.71  CREATININE 0.99  --   --     Estimated Creatinine Clearance: 51 mL/min (by C-G formula based on Cr of 0.99).   Medical History: Past Medical History  Diagnosis Date  . Diabetes mellitus without complication (HCC)   . Hypertension   . A-fib (HCC)   . GERD (gastroesophageal reflux disease)   . CHF (congestive heart failure) (HCC)   . Fracture, femur (HCC)     Medications:    Assessment: Hgb 9.8  INR 3.52 - warfarin dose held  1/12 AM INR 2.74 - warfarin 5 mg given 1/13 AM INR 1.67, Hgb 9.3, plts 240 1/14 AM INR 1.57. Increase to 6 mg daily. 1/15 am INR 1.71   Goal of Therapy:  INR 2-3 Monitor platelets by anticoagulation protocol: Yes   Plan:  Will continue current dose of warfarin 6 mg daily. Patient on Azithromycin/Zosyn. INR/CBC in am  Pharmacy will continue to follow.  Twan Harkin A 07/19/2015,8:12 AM

## 2015-07-19 NOTE — Progress Notes (Signed)
Order for soap suds enema. Pt refusing at this time. Only wants the colace. Will continue to monitor.

## 2015-07-20 DIAGNOSIS — J441 Chronic obstructive pulmonary disease with (acute) exacerbation: Secondary | ICD-10-CM

## 2015-07-20 DIAGNOSIS — Z8781 Personal history of (healed) traumatic fracture: Secondary | ICD-10-CM

## 2015-07-20 DIAGNOSIS — J189 Pneumonia, unspecified organism: Secondary | ICD-10-CM

## 2015-07-20 DIAGNOSIS — Z9889 Other specified postprocedural states: Secondary | ICD-10-CM

## 2015-07-20 DIAGNOSIS — R531 Weakness: Secondary | ICD-10-CM

## 2015-07-20 DIAGNOSIS — R627 Adult failure to thrive: Secondary | ICD-10-CM

## 2015-07-20 DIAGNOSIS — J181 Lobar pneumonia, unspecified organism: Secondary | ICD-10-CM

## 2015-07-20 LAB — CBC
HEMATOCRIT: 27.7 % — AB (ref 40.0–52.0)
Hemoglobin: 9 g/dL — ABNORMAL LOW (ref 13.0–18.0)
MCH: 28 pg (ref 26.0–34.0)
MCHC: 32.6 g/dL (ref 32.0–36.0)
MCV: 86.1 fL (ref 80.0–100.0)
PLATELETS: 328 10*3/uL (ref 150–440)
RBC: 3.22 MIL/uL — ABNORMAL LOW (ref 4.40–5.90)
RDW: 14.8 % — AB (ref 11.5–14.5)
WBC: 13.2 10*3/uL — AB (ref 3.8–10.6)

## 2015-07-20 LAB — CULTURE, RESPIRATORY W GRAM STAIN
Culture: NORMAL
Special Requests: NORMAL

## 2015-07-20 LAB — PROTIME-INR
INR: 2.41
Prothrombin Time: 26 seconds — ABNORMAL HIGH (ref 11.4–15.0)

## 2015-07-20 LAB — BASIC METABOLIC PANEL
ANION GAP: 4 — AB (ref 5–15)
BUN: 12 mg/dL (ref 6–20)
CALCIUM: 8.5 mg/dL — AB (ref 8.9–10.3)
CO2: 24 mmol/L (ref 22–32)
CREATININE: 0.81 mg/dL (ref 0.61–1.24)
Chloride: 113 mmol/L — ABNORMAL HIGH (ref 101–111)
GFR calc Af Amer: >60 mL/min (ref >60)
GLUCOSE: 162 mg/dL — AB (ref 65–99)
Potassium: 3.3 mmol/L — ABNORMAL LOW (ref 3.5–5.1)
Sodium: 141 mmol/L (ref 135–145)

## 2015-07-20 LAB — GLUCOSE, CAPILLARY
Glucose-Capillary: 186 mg/dL — ABNORMAL HIGH (ref 65–99)
Glucose-Capillary: 198 mg/dL — ABNORMAL HIGH (ref 65–99)

## 2015-07-20 MED ORDER — PREDNISONE 10 MG (21) PO TBPK: 10.0000 mg | ORAL_TABLET | Freq: Every day | ORAL | Status: DC

## 2015-07-20 MED ORDER — LEVOFLOXACIN 500 MG PO TABS: 500.0000 mg | ORAL_TABLET | Freq: Every day | ORAL | Status: DC

## 2015-07-20 MED ORDER — OXYCODONE HCL 5 MG PO TABS: 5.0000 mg | ORAL_TABLET | ORAL | Status: DC | PRN

## 2015-07-20 MED ORDER — DOCUSATE SODIUM 100 MG PO CAPS: 100.0000 mg | ORAL_CAPSULE | Freq: Two times a day (BID) | ORAL | Status: DC

## 2015-07-20 MED ORDER — LEVOFLOXACIN 500 MG PO TABS
500.0000 mg | ORAL_TABLET | Freq: Every day | ORAL | Status: DC
  Administered 2015-07-20: 500 mg via ORAL
  Filled 2015-07-20: qty 1

## 2015-07-20 MED ORDER — SENNA 8.6 MG PO TABS: 1.0000 | ORAL_TABLET | Freq: Every day | ORAL | Status: DC | PRN

## 2015-07-20 MED ORDER — HYDROCOD POLST-CPM POLST ER 10-8 MG/5ML PO SUER: 5.0000 mL | Freq: Two times a day (BID) | ORAL | Status: DC

## 2015-07-20 MED ORDER — IPRATROPIUM-ALBUTEROL 0.5-2.5 (3) MG/3ML IN SOLN: 3.0000 mL | RESPIRATORY_TRACT | Status: DC | PRN

## 2015-07-20 MED ORDER — GUAIFENESIN-CODEINE 100-10 MG/5ML PO SOLN: 10.0000 mL | ORAL | Status: DC | PRN

## 2015-07-20 MED ORDER — WARFARIN SODIUM 6 MG PO TABS: 6.0000 mg | ORAL_TABLET | Freq: Every day | ORAL | Status: DC

## 2015-07-20 MED ORDER — GUAIFENESIN ER 600 MG PO TB12: 600.0000 mg | ORAL_TABLET | Freq: Two times a day (BID) | ORAL | Status: DC

## 2015-07-20 NOTE — Progress Notes (Signed)
Clinical Social Worker was informed that patient will be medically ready to discharge to [facility]. Patient and his wife are in a agreement with plan. CSW called Joann, admissions coordinator [facility] to confirm that patient's bed is ready. Provided patient's room number E-15 and number to call for report [phone] . [facility] via [facility]. RX's added to discharge packet.   RN will call report and [facility].   Woodroe Modehristina Zohar Maroney, MSW, LCSW-A Clinical Social Work Department [phone]

## 2015-07-20 NOTE — Care Management Important Message (Signed)
Important Message  Patient Details  Name: Riley BimlerMilton Minahan Jr. MRN: [id number] Date of Birth: 27-Jul-1929   Medicare Important Message Given:  Yes    Taegen Lennox A, RN 07/20/2015, 8:09 AM

## 2015-07-20 NOTE — NC FL2 (Signed)
[place] MEDICAID FL2 LEVEL OF CARE SCREENING TOOL     IDENTIFICATION  Patient Name: Riley Sanchez. Birthdate: [birth year] Sex: male Admission Date (Current Location): 07/15/2015  [place] and [place] Number:  Chiropodist and Address:  [facility], [address], [place], [place] [address]      Provider Number: [id number]  Attending Physician Name and Address:  Katharina Caper, MD  Relative Name and Phone Number:       Current Level of Care: Hospital Recommended Level of Care: Skilled Nursing Facility Prior Approval Number:    Date Approved/Denied:   PASRR Number:  [id number])  Discharge Plan: SNF    Current Diagnoses: Patient Active Problem List   Diagnosis Date Noted  . Protein-calorie malnutrition, severe 07/17/2015  . Sepsis (HCC) 07/16/2015  . Hyperkalemia 07/15/2015  . Acute kidney injury (HCC) 07/15/2015  . Sacral decubitus ulcer 07/15/2015  . Hip fracture (HCC) 05/18/2015    Orientation RESPIRATION BLADDER Height & Weight    Self, Situation, Place  O2 (2 Liters Oxygen ) Continent 5\' 6"  (167.6 cm) 146 lbs.  BEHAVIORAL SYMPTOMS/MOOD NEUROLOGICAL BOWEL NUTRITION STATUS   (none )  (none ) Incontinent Diet (Diet: Heart Healthy )  AMBULATORY STATUS COMMUNICATION OF NEEDS Skin   Extensive Assist Verbally PU Stage and Appropriate Care (Pressure Ulcer Stage 2: Sacrum. Pressure Ulcer Unstagable Right Heel. Pressure Ulcer Stage1: Left Heel. )                       Personal Care Assistance Level of Assistance  Bathing, Feeding, Dressing Bathing Assistance: Limited assistance Feeding assistance: Independent Dressing Assistance: Limited assistance     Functional Limitations Info  Sight, Hearing, Speech Sight Info: Adequate Hearing Info: Adequate Speech Info: Adequate    SPECIAL CARE FACTORS FREQUENCY  PT (By licensed PT), OT (By licensed OT)     PT Frequency:  (5) OT Frequency:  (5)             Contractures      Additional Factors Info  Code Status, Allergies, Insulin Sliding Scale, Isolation Precautions Code Status Info:  (Full Code. 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 acetaminophen (TYLENOL) tablet 650 mg  650 mg Oral Q6H PRN Wyatt Haste, MD       Or  . acetaminophen (TYLENOL) suppository 650 mg  650 mg Rectal Q6H PRN Wyatt Haste, MD      . aspirin EC tablet 81 mg  81 mg Oral Daily Wyatt Haste, MD   81 mg at 07/20/15 0847  . chlorpheniramine-HYDROcodone (TUSSIONEX) 10-8 MG/5ML suspension 5 mL  5 mL Oral Q12H Katharina Caper, MD   5 mL at 07/20/15 0845  . cholecalciferol (VITAMIN D) tablet 1,000 Units  1,000 Units Oral Daily Wyatt Haste, MD   1,000 Units at 07/20/15 [phone]  . dextrose 5 % and 0.9 % NaCl with KCl 20 mEq/L infusion   Intravenous Continuous Katharina Caper, MD 75 mL/hr at 07/19/15 1835    . diphenhydrAMINE (BENADRYL) capsule 25 mg  25 mg Oral QHS PRN Wyatt Haste, MD      . docusate sodium (COLACE) capsule 100 mg  100 mg Oral BID Katharina Caper, MD   100 mg at 07/19/15 2100  . feeding supplement (ENSURE ENLIVE) (ENSURE ENLIVE) liquid 237 mL  237 mL Oral TID WC Wyatt Haste, MD   237 mL at 07/20/15 0800  . ferrous sulfate tablet 325 mg  325 mg Oral Q breakfast Wyatt Haste, MD   325 mg at 07/20/15 0846  . gabapentin (NEURONTIN) capsule 100 mg  100 mg Oral QHS Wyatt Haste, MD   100 mg at 07/19/15 2100  . guaiFENesin (MUCINEX) 12 hr tablet 600 mg  600 mg Oral BID Katharina Caper, MD   600 mg at 07/20/15 0846  . guaiFENesin-codeine 100-10 MG/5ML solution 10 mL  10 mL Oral Q4H PRN Wyatt Haste, MD   10 mL at  07/20/15 0510  . insulin aspart (novoLOG) injection 0-5 Units  0-5 Units Subcutaneous QHS Wyatt Haste, MD   2 Units at 07/19/15 2221  . insulin aspart (novoLOG) injection 0-9 Units  0-9 Units Subcutaneous TID WC Wyatt Haste, MD   2 Units at 07/20/15 0845  . ipratropium-albuterol (DUONEB) 0.5-2.5 (3) MG/3ML nebulizer solution 3 mL  3 mL Nebulization Q4H PRN Wyatt Haste, MD      . levofloxacin [facility]) tablet 500 mg  500 mg Oral Daily Katharina Caper, MD   500 mg at 07/20/15 0846  . mometasone-formoterol (DULERA) 200-5 MCG/ACT inhaler 2 puff  2 puff Inhalation BID Wyatt Haste, MD   2 puff at 07/20/15 0845  . morphine 2 MG/ML injection 2 mg  2 mg Intravenous Q4H PRN Wyatt Haste, MD      . ondansetron [facility]) tablet 4 mg  4 mg Oral Q6H PRN Wyatt Haste, MD       Or  . ondansetron [facility]) injection 4 mg  4 mg Intravenous Q6H PRN Wyatt Haste, MD      . oxyCODONE (Oxy IR/ROXICODONE) immediate release tablet 5 mg  5 mg Oral Q4H PRN Wyatt Haste, MD      . pantoprazole (PROTONIX) EC tablet 40 mg  40 mg Oral Daily Wyatt Haste, MD   40 mg at 07/20/15 0847  . piperacillin-tazobactam (ZOSYN) IVPB 3.375 g  3.375 g Intravenous 3 times per day Sherry Ruffing, RPH   3.375 g at 07/20/15 0659  . polyethylene glycol (MIRALAX / GLYCOLAX) packet 17 g  17 g Oral Daily PRN Wyatt Haste, MD   17 g at 07/20/15 0510  . pravastatin (PRAVACHOL) tablet 40 mg  40 mg Oral q1800 Wyatt Haste, MD   40 mg at 07/19/15 1834  . predniSONE (DELTASONE) tablet 50 mg  50 mg Oral Q breakfast Katharina Caper, MD   50 mg at 07/20/15 0847  . senna (SENOKOT) tablet 8.6 mg  1 tablet Oral Daily PRN Katharina Caper, MD      . sodium chloride 0.9 % injection 3 mL  3 mL Intravenous Q12H Wyatt Haste, MD   3 mL at 07/18/15 2105  . tiotropium (SPIRIVA) inhalation capsule 18 mcg  18 mcg Inhalation Daily Wyatt Haste, MD   18 mcg at 07/20/15 0846  . warfarin (COUMADIN) tablet 6 mg  6 mg Oral q1800 Wyatt Haste, MD   6 mg at  07/19/15 1834  . Warfarin - Pharmacist Dosing Inpatient   Does not apply q1800 Wyatt Haste, MD         Discharge Medications: Please see discharge summary for a  list of discharge medications.  Relevant Imaging Results:  Relevant Lab Results:   Additional Information  (SSN: [id number])  Verta Ellenhristina E Yannely Kintzel, LCSW

## 2015-07-20 NOTE — Progress Notes (Signed)
Speech Therapy Note: reviewed chart notes; consulted MD then pt and NSG. NSG and pt denied any trouble swallowing; pt is tolerating his Dys. 3 diet w/ thin liquids following general aspiration precautions; pt drinking his Ensure and taking meds w/out difficulty per NSG. Instructed pt to continue w/ general aspiration and Reflux precautions w/ any po's. No further skilled ST services indicated at this time.

## 2015-07-20 NOTE — Progress Notes (Signed)
[facility] called to [facility]. [facility].  Trudee KusterBrandi R [place]

## 2015-07-20 NOTE — Discharge Summary (Signed)
[facility] -  at [facility]   PATIENT NAME: Riley CruiseMilton Sanchez    MR#:  [id number]  DATE OF BIRTH:  October 19, 1929  DATE OF ADMISSION:  07/15/2015 ADMITTING PHYSICIAN: Wyatt Hasteavid K Hower, MD  DATE OF DISCHARGE: No discharge date for patient encounter.  PRIMARY CARE PHYSICIAN: Sula RumpleVirk, Charanjit, MD     ADMISSION DIAGNOSIS:  Hyperkalemia [E87.5] Acute respiratory failure with hypoxia (HCC) [J96.01] AKI (acute kidney injury) (HCC) [N17.9] HCAP (healthcare-associated pneumonia) [J18.9] Sepsis, due to unspecified organism (HCC) [A41.9]  DISCHARGE DIAGNOSIS:  Principal Problem:   Hyperkalemia Active Problems:   Sepsis (HCC)   Left lower lobe pneumonia   COPD exacerbation (HCC)   Acute kidney injury (HCC)   Protein-calorie malnutrition, severe   Failure to thrive in adult   Sacral decubitus ulcer   S/P ORIF (open reduction internal fixation) fracture   Generalized weakness   SECONDARY DIAGNOSIS:   Past Medical History  Diagnosis Date  . Diabetes mellitus without complication (HCC)   . Hypertension   . A-fib (HCC)   . GERD (gastroesophageal reflux disease)   . CHF (congestive heart failure) (HCC)   . Fracture, femur (HCC)     .pro HOSPITAL COURSE:   The patient is a 82 year old Caucasian male who presents to the hospital with complaints of cough, yellow phlegm, fevers and chills, weakness. In emergency room, he was noted to be hypoxic, was noted to have elevated potassium level, which was treated with insulin, calcium gluconate and IV fluids and improved. She was also noted to have acute renal failure with creatinine of 1.6, normal being 0.8. No nausea, vomiting, diarrhea noted. Chest x-ray revealed bilateral opacities concerning for pneumonia. Blood cultures 2 are negative, MRSA PCR is negative. Sputum culture revealed normal flora, C. difficile is negative. The patient was treated with antibiotic therapy, IV fluids and his condition improved. It was  felt that he is stable to be discharged to skilled nursing facility for continuation of his rehabilitation today Discussion by problem 1. Sepsis due to bacterial pneumonia , initially was given antibiotic with zosyn/zithromax, now changed to levofloxacin to continue for 7 days , blood cultures are negative. MRSA PCR screening negative , unremarkable sputum culture , just normal flora. Aspiration pneumonitis is likely possibility. Patient was seen by SLP and recommended dysphagia 3 diet with thin liquids.  2. COPD exacerbation due to pneumonia , continue DuoNeb nebs , steroid inhaler , continue oxygen as needed, attempted to wean off oxygen. However, her O2 sats were 85% on room air. Continue tapering prednisone , continue robitussin, clinically improved  3 . Acute renal insufficiency , resolved with IV fluid administration , follow labs as outpatient as needed.  4. Hyperkalemia, resolved with multiple medications, IV fluids , most recent potassium level was 3.315th of January 5. Leukocytosis , resolved with antibiotic therapy. Blood cultures are negative, continue antibiotic therapy due to concern of aspiration pneumonitis, although sputum culture revealed normal flora 6 . Diarrheal stool, per report, C. difficile negative, no loose stool in the hospital. Last bowel movement was reported on January 16 th.  7 sacral decubitus, care per protocol, PT 8 recent L femoral neck fx and s/p ORIF by DR Martha ClanKrasinski, PT and weight bearing as tolerated, per ortho, appreciate input x-ray was normal 9. Failure to thrive, adult, follow oral intake closely.  Continue feeding supplements, ensure .  Discussed with patient's wife  DISCHARGE CONDITIONS:   Stable  CONSULTS OBTAINED:  Treatment Team:  Wyatt Hasteavid K Hower, MD  DRUG  ALLERGIES:   Allergies  Allergen Reactions  . Bactrim [Sulfamethoxazole-Trimethoprim] Other (See Comments)    Reaction:  Unknown     DISCHARGE MEDICATIONS:   Current Discharge  Medication List    START taking these medications   Details  chlorpheniramine-HYDROcodone (TUSSIONEX) 10-8 MG/5ML SUER Take 5 mLs by mouth every 12 (twelve) hours. Qty: 140 mL, Refills: 0    docusate sodium (COLACE) 100 MG capsule Take 1 capsule (100 mg total) by mouth 2 (two) times daily. Qty: 10 capsule, Refills: 0    guaiFENesin (MUCINEX) 600 MG 12 hr tablet Take 1 tablet (600 mg total) by mouth 2 (two) times daily. Qty: 20 tablet, Refills: 3    guaiFENesin-codeine 100-10 MG/5ML syrup Take 10 mLs by mouth every 4 (four) hours as needed for cough. Qty: 120 mL, Refills: 0    ipratropium-albuterol (DUONEB) 0.5-2.5 (3) MG/3ML SOLN Take 3 mLs by nebulization every 4 (four) hours as needed. Qty: 360 mL, Refills: 6    levofloxacin (LEVAQUIN) 500 MG tablet Take 1 tablet (500 mg total) by mouth daily. Qty: 7 tablet, Refills: 0    predniSONE (STERAPRED UNI-PAK 21 TAB) 10 MG (21) TBPK tablet Take 1 tablet (10 mg total) by mouth daily. Please take 6 pills in the morning on the first day then taper by one pill daily until finished. Thank you Qty: 21 tablet, Refills: 0    senna (SENOKOT) 8.6 MG TABS tablet Take 1 tablet (8.6 mg total) by mouth daily as needed for mild constipation. Qty: 120 each, Refills: 0      CONTINUE these medications which have CHANGED   Details  oxyCODONE (OXY IR/ROXICODONE) 5 MG immediate release tablet Take 1 tablet (5 mg total) by mouth every 4 (four) hours as needed for moderate pain. Qty: 30 tablet, Refills: 0    warfarin (COUMADIN) 6 MG tablet Take 1 tablet (6 mg total) by mouth daily at 6 PM. Qty: 30 tablet, Refills: 6      CONTINUE these medications which have NOT CHANGED   Details  acetaminophen (TYLENOL) 325 MG tablet Take 650 mg by mouth every 6 (six) hours as needed for mild pain, fever or headache.     aspirin EC 81 MG tablet Take 81 mg by mouth daily.    benzonatate (TESSALON) 200 MG capsule Take 1 capsule (200 mg total) by mouth 3 (three) times  daily as needed for cough. Qty: 20 capsule, Refills: 0    cholecalciferol (VITAMIN D) 1000 UNITS tablet Take 1,000 Units by mouth daily.    diphenhydrAMINE (BENADRYL) 25 mg capsule Take 25 mg by mouth at bedtime as needed for sleep.    feeding supplement, ENSURE ENLIVE, (ENSURE ENLIVE) LIQD Take 237 mLs by mouth 3 (three) times daily with meals. Qty: 237 mL, Refills: 12    ferrous sulfate 325 (65 FE) MG tablet Take 325 mg by mouth daily with breakfast.    gabapentin (NEURONTIN) 100 MG capsule Take 100 mg by mouth at bedtime.    insulin aspart (NOVOLOG) 100 UNIT/ML injection Inject 1-9 Units into the skin 3 (three) times daily with meals as needed for high blood sugar. Pt uses per sliding scale:    121-150:  1 unit  151-200:  2 units  201-250:  3 units  251-300:  5 units  301-350:  7 units  351-400:  9 units  Greater than 400:  Call MD    lisinopril (PRINIVIL,ZESTRIL) 10 MG tablet Take 10 mg by mouth daily.    lovastatin (MEVACOR)  40 MG tablet Take 40 mg by mouth at bedtime.    menthol-cetylpyridinium (CEPACOL) 3 MG lozenge Take 1 lozenge (3 mg total) by mouth as needed for sore throat. Qty: 100 tablet, Refills: 12    mometasone-formoterol (DULERA) 200-5 MCG/ACT AERO Inhale 2 puffs into the lungs 2 (two) times daily. Qty: 13 g, Refills: 0    omeprazole (PRILOSEC) 20 MG capsule Take 20 mg by mouth daily.    polyethylene glycol (MIRALAX / GLYCOLAX) packet Take 17 g by mouth daily as needed for moderate constipation.     tiotropium (SPIRIVA) 18 MCG inhalation capsule Place 1 capsule (18 mcg total) into inhaler and inhale daily. Qty: 30 capsule, Refills: 12      STOP taking these medications     furosemide (LASIX) 20 MG tablet      metFORMIN (GLUCOPHAGE) 1000 MG tablet      potassium chloride SA (K-DUR,KLOR-CON) 20 MEQ tablet          DISCHARGE INSTRUCTIONS:    Patient is to follow-up with primary care physician and orthopedist surgeon  If you experience  worsening of your admission symptoms, develop shortness of breath, life threatening emergency, suicidal or homicidal thoughts you must seek medical attention immediately by calling 911 or calling your MD immediately  if symptoms less severe.  You Must read complete instructions/literature along with all the possible adverse reactions/side effects for all the Medicines you take and that have been prescribed to you. Take any new Medicines after you have completely understood and accept all the possible adverse reactions/side effects.   Please note  You were cared for by a hospitalist during your hospital stay. If you have any questions about your discharge medications or the care you received while you were in the hospital after you are discharged, you can call the unit and asked to speak with the hospitalist on call if the hospitalist that took care of you is not available. Once you are discharged, your primary care physician will handle any further medical issues. Please note that NO REFILLS for any discharge medications will be authorized once you are discharged, as it is imperative that you return to your primary care physician (or establish a relationship with a primary care physician if you do not have one) for your aftercare needs so that they can reassess your need for medications and monitor your lab values.    Today   CHIEF COMPLAINT:   Chief Complaint  Patient presents with  . Fever    HISTORY OF PRESENT ILLNESS:  Riley Sanchez  is a 82 y.o. male with a known history of recent left hip fracture status post ORIF, diabetes, hypertension, atrial fibrillation for which patient is on Coumadin, gastroesophageal reflux disease, congestive heart failure who presents to the hospital with complaints of cough, yellow phlegm, fevers and chills, weakness. In emergency room, he was noted to be hypoxic, was noted to have elevated potassium level, which was treated with insulin, calcium gluconate and  IV fluids and improved. She was also noted to have acute renal failure with creatinine of 1.6, normal being 0.8. No nausea, vomiting, diarrhea noted. Chest x-ray revealed bilateral opacities concerning for pneumonia. Blood cultures 2 are negative, MRSA PCR is negative. Sputum culture revealed normal flora, C. difficile is negative. The patient was treated with antibiotic therapy, IV fluids and his condition improved. It was felt that he is stable to be discharged to skilled nursing facility for continuation of his rehabilitation today Discussion by problem 1. Sepsis  due to bacterial pneumonia , initially was given antibiotic with zosyn/zithromax, now changed to levofloxacin to continue for 7 days , blood cultures are negative. MRSA PCR screening negative , unremarkable sputum culture , just normal flora. Aspiration pneumonitis is likely possibility. Patient was seen by SLP and recommended dysphagia 3 diet with thin liquids.  2. COPD exacerbation due to pneumonia , continue DuoNeb nebs , steroid inhaler , continue oxygen as needed, attempted to wean off oxygen. However, her O2 sats were 85% on room air. Continue tapering prednisone , continue robitussin, clinically improved  3 . Acute renal insufficiency , resolved with IV fluid administration , follow labs as outpatient as needed.  4. Hyperkalemia, resolved with multiple medications, IV fluids , most recent potassium level was 3.315th of January 5. Leukocytosis , resolved with antibiotic therapy. Blood cultures are negative, continue antibiotic therapy due to concern of aspiration pneumonitis, although sputum culture revealed normal flora 6 . Diarrheal stool, per report, C. difficile negative, no loose stool in the hospital. Last bowel movement was reported on January 16 th.  7 sacral decubitus, care per protocol, PT 8 recent L femoral neck fx and s/p ORIF by DR Martha Clan, PT and weight bearing as tolerated, per ortho, appreciate input x-ray was  normal 9. Failure to thrive, adult, follow oral intake closely.  Continue feeding supplements, ensure .  Discussed with patient's wife     VITAL SIGNS:  Blood pressure 142/49, pulse 89, temperature 97.9 F (36.6 C), temperature source Oral, resp. rate 18, height 5\' 6"  (1.676 m), weight 66.633 kg (146 lb 14.4 oz), SpO2 85 %.  I/O:   Intake/Output Summary (Last 24 hours) at 07/20/15 1340 Last data filed at 07/20/15 0904  Gross per 24 hour  Intake 1966.25 ml  Output    500 ml  Net 1466.25 ml    PHYSICAL EXAMINATION:  GENERAL:  82 y.o.-year-old patient lying in the bed with no acute distress.  EYES: Pupils equal, round, reactive to light and accommodation. No scleral icterus. Extraocular muscles intact.  HEENT: Head atraumatic, normocephalic. Oropharynx and nasopharynx clear.  NECK:  Supple, no jugular venous distention. No thyroid enlargement, no tenderness.  LUNGS: Normal breath sounds bilaterally, no wheezing, rales,rhonchi or crepitation. No use of accessory muscles of respiration.  CARDIOVASCULAR: S1, S2 normal. No murmurs, rubs, or gallops.  ABDOMEN: Soft, non-tender, non-distended. Bowel sounds present. No organomegaly or mass.  EXTREMITIES: No pedal edema, cyanosis, or clubbing.  NEUROLOGIC: Cranial nerves II through XII are intact. Muscle strength 5/5 in all extremities. Sensation intact. Gait not checked.  PSYCHIATRIC: The patient is alert and oriented x 3.  SKIN: No obvious rash, lesion, or ulcer.   DATA REVIEW:   CBC  Recent Labs Lab 07/20/15 0427  WBC 13.2*  HGB 9.0*  HCT 27.7*  PLT 328    Chemistries   Recent Labs Lab 07/15/15 1947  07/20/15 0427  NA 135  < > 141  K 6.2*  < > 3.3*  CL 110  < > 113*  CO2 22  < > 24  GLUCOSE 128*  < > 162*  BUN 43*  < > 12  CREATININE 1.60*  < > 0.81  CALCIUM 9.1  < > 8.5*  AST 14*  --   --   ALT 13*  --   --   ALKPHOS 70  --   --   BILITOT 0.8  --   --   < > = values in this interval not displayed.  Cardiac  Enzymes No results for input(s): TROPONINI in the last 168 hours.  Microbiology Results  Results for orders placed or performed during the hospital encounter of 07/15/15  Blood culture (routine x 2)     Status: None (Preliminary result)   Collection Time: 07/15/15  7:30 PM  Result Value Ref Range Status   Specimen Description BLOOD RIGHT ASSIST CONTROL  Final   Special Requests   Final    BOTTLES DRAWN AEROBIC AND ANAEROBIC ANA 2CC AERO 4CC   Culture NO GROWTH 4 DAYS  Final   Report Status PENDING  Incomplete  Blood culture (routine x 2)     Status: None (Preliminary result)   Collection Time: 07/15/15  7:47 PM  Result Value Ref Range Status   Specimen Description BLOOD RIGHT ASSIST CONTROL  Final   Special Requests BOTTLES DRAWN AEROBIC AND ANAEROBIC 2CC  Final   Culture NO GROWTH 4 DAYS  Final   Report Status PENDING  Incomplete  C difficile quick scan w PCR reflex     Status: None   Collection Time: 07/16/15  2:50 AM  Result Value Ref Range Status   C Diff antigen NEGATIVE NEGATIVE Final   C Diff toxin NEGATIVE NEGATIVE Final   C Diff interpretation Negative for C. difficile  Final  MRSA PCR Screening     Status: None   Collection Time: 07/16/15  6:08 AM  Result Value Ref Range Status   MRSA by PCR NEGATIVE NEGATIVE Final    Comment:        The GeneXpert MRSA Assay (FDA approved for NASAL specimens only), is one component of a comprehensive MRSA colonization surveillance program. It is not intended to diagnose MRSA infection nor to guide or monitor treatment for MRSA infections.   Culture, expectorated sputum-assessment     Status: None   Collection Time: 07/17/15  8:30 AM  Result Value Ref Range Status   Specimen Description EXPECTORATED SPUTUM  Final   Special Requests Normal  Final   Sputum evaluation THIS SPECIMEN IS ACCEPTABLE FOR SPUTUM CULTURE  Final   Report Status 07/17/2015 FINAL  Final  Culture, respiratory (NON-Expectorated)     Status: None (Preliminary  result)   Collection Time: 07/17/15  8:30 AM  Result Value Ref Range Status   Specimen Description EXPECTORATED SPUTUM  Final   Special Requests Normal Reflexed from [id number]  Final   Gram Stain   Final    GOOD SPECIMEN - 80-90% WBCS MANY WBC SEEN FEW GRAM POSITIVE COCCI IN PAIRS IN CLUSTERS FEW YEAST    Culture Consistent with normal respiratory flora.  Final   Report Status PENDING  Incomplete    RADIOLOGY:  No results found.  EKG:   Orders placed or performed during the hospital encounter of 07/15/15  . EKG 12-Lead  . EKG 12-Lead      Management plans discussed with the patient, family and they are in agreement.  CODE STATUS:     Code Status Orders        Start     Ordered   07/15/15 2304  Full code   Continuous     07/15/15 2305    Code Status History    Date Active Date Inactive Code Status Order ID Comments User Context   06/14/2015  3:10 AM 06/19/2015  6:54 PM Full Code [id number]  Katharina Caper, MD Inpatient   05/19/2015  2:07 PM 05/22/2015  5:04 PM Full Code [id number]  Juanell Fairly, MD Inpatient   05/18/2015  5:32  PM 05/19/2015  2:07 PM Full Code [id number]  Enedina Finner, MD Inpatient   05/18/2015  5:18 PM 05/18/2015  5:32 PM Full Code [id number]  Juanell Fairly, MD ED      TOTAL TIME TAKING CARE OF THIS PATIENT: 40 minutes.    Katharina Caper M.D on 07/20/2015 at 1:40 PM  Between 7am to 6pm - Pager - [phone]  After 6pm go to [email or web address] - password [email or web address] [facility]  [facility]  Office  [phone]  CC: Primary care physician; Sula Rumple, MD

## 2015-07-20 NOTE — Progress Notes (Signed)
Physical Therapy Treatment Patient Details Name: Riley BimlerMilton Haseman Jr. MRN: [id number] DOB: 07-03-1930 Today's Date: 07/20/2015    History of Present Illness Patient is an 82 y/o male presenting from [facility] with worsening cough, fevers, weakness. L femoral neck fx on 05/19/2015 with percutaneus screw had been NWB, however per ortho note he is now considered WBAT.     PT Comments    Patient noted with resting sats 85-87% beginning of session; RN informed/aware and instructed in application of 2L supplemental O2 to maintain sats >90% remainder of session. Patient with improved activity tolerance this date, requiring less assist for sitting balance and able to achieve OOB to chair with +2 assist.  Fair weight acceptance (with no reported pain) in L LE during mobility efforts, but displays poor standing balance and limited functional activity tolerance. Does require elevated bed surface to complete boosting or sit/stand activities.   Follow Up Recommendations  SNF     Equipment Recommendations       Recommendations for Other Services       Precautions / Restrictions Precautions Precautions: Fall Precaution Comments: contact isolation Restrictions Weight Bearing Restrictions: Yes LLE Weight Bearing: Weight bearing as tolerated    Mobility  Bed Mobility Overal bed mobility: Needs Assistance Bed Mobility: Supine to Sit     Supine to sit: Mod assist        Transfers Overall transfer level: Needs assistance Equipment used: Rolling walker (2 wheeled) Transfers: Sit to/from Stand Sit to Stand: Mod assist;+2 safety/equipment            Ambulation/Gait Ambulation/Gait assistance: Mod assist;+2 physical assistance Ambulation Distance (Feet): 6 Feet Assistive device: Rolling walker (2 wheeled)       General Gait Details: step to gait pattern with very short, shuffling steps; maintains forward flexion at bilat hips/knees (L > R).  Poor balance, activity tolerance; unsafe to  attempt without +2 at this time.  Fair weight acceptance L LE with no reports of pain   Stairs            Wheelchair Mobility    Modified Rankin (Stroke Patients Only)       Balance Overall balance assessment: Needs assistance Sitting-balance support: No upper extremity supported;Feet supported Sitting balance-Leahy Scale: Good Sitting balance - Comments: able to maintain with close sup this date, no L lateral lean noted   Standing balance support: Bilateral upper extremity supported Standing balance-Leahy Scale: Poor                      Cognition Arousal/Alertness: Awake/alert Behavior During Therapy: WFL for tasks assessed/performed Overall Cognitive Status: Within Functional Limits for tasks assessed                      Exercises Other Exercises Other Exercises: Boosting edge of bed, 2x5, for closed-chain activation of bilat LEs and facilitation of mechanics (ant weight translation) required for full sit/stand.  Able to fully clear buttocks from elevated bed surface, but unable to complete from standard bed surface Other Exercises: Sit/stand x3 with RW, mod assist +1-2 for safety.  Increased time for anterior weight translation and postural extension as able.    General Comments        Pertinent Vitals/Pain Pain Assessment: No/denies pain    Home Living                      Prior Function  PT Goals (current goals can now be found in the care plan section) Acute Rehab PT Goals Patient Stated Goal: To walk again PT Goal Formulation: With patient Time For Goal Achievement: 08/15/15 Potential to Achieve Goals: Fair Progress towards PT goals: Progressing toward goals    Frequency  Min 2X/week    PT Plan Current plan remains appropriate    Co-evaluation             End of Session Equipment Utilized During Treatment: Gait belt Activity Tolerance: Patient tolerated treatment well Patient left: in chair;with  call bell/phone within reach;with chair alarm set;with family/visitor present     Time: 0935-1010 PT Time Calculation (min) (ACUTE ONLY): 35 min  Charges:  $Gait Training: 8-22 mins $Therapeutic Activity: 8-22 mins                    G Codes:      Ademide Schaberg H. Manson Passey, PT, DPT, NCS 07/20/2015, 12:03 PM [phone]

## 2015-07-20 NOTE — Progress Notes (Signed)
SATURATION QUALIFICATIONS: (This note is used to comply with regulatory documentation for home oxygen)  Patient Saturations on Room Air at Rest = 85-87%  Patient Saturations on Room Air while Ambulating = NA  Patient Saturations on 2 Liters of oxygen while Ambulating = 96%  Principal FinancialBrandi R Mansfield

## 2015-07-20 NOTE — Progress Notes (Signed)
Patient d/c'd to [facility]. Education provided, no questions at this time. Patient to be picked up by EMS. Telemetry removed. Trudee KusterBrandi R Mansfield

## 2015-07-21 LAB — CULTURE, BLOOD (ROUTINE X 2)
CULTURE: NO GROWTH
Culture: NO GROWTH

## 2015-07-21 LAB — GLUCOSE, CAPILLARY: GLUCOSE-CAPILLARY: 131 mg/dL — AB (ref 65–99)

## 2015-07-23 ENCOUNTER — Inpatient Hospital Stay
Admission: EM | Admit: 2015-07-23 | Discharge: 2015-07-29 | DRG: 193 | Disposition: A | Discharge status: Hospice | Payer: Medicare Other | Attending: Internal Medicine | Admitting: Internal Medicine

## 2015-07-23 ENCOUNTER — Emergency Department: Payer: Medicare Other

## 2015-07-23 ENCOUNTER — Inpatient Hospital Stay: Payer: Medicare Other

## 2015-07-23 ENCOUNTER — Encounter: Payer: Self-pay | Admitting: *Deleted

## 2015-07-23 DIAGNOSIS — I480 Paroxysmal atrial fibrillation: Secondary | ICD-10-CM | POA: Diagnosis present

## 2015-07-23 DIAGNOSIS — I130 Hypertensive heart and chronic kidney disease with heart failure and stage 1 through stage 4 chronic kidney disease, or unspecified chronic kidney disease: Secondary | ICD-10-CM | POA: Diagnosis present

## 2015-07-23 DIAGNOSIS — Z951 Presence of aortocoronary bypass graft: Secondary | ICD-10-CM

## 2015-07-23 DIAGNOSIS — G9341 Metabolic encephalopathy: Secondary | ICD-10-CM | POA: Diagnosis present

## 2015-07-23 DIAGNOSIS — I5042 Chronic combined systolic (congestive) and diastolic (congestive) heart failure: Secondary | ICD-10-CM | POA: Diagnosis present

## 2015-07-23 DIAGNOSIS — D689 Coagulation defect, unspecified: Secondary | ICD-10-CM | POA: Diagnosis present

## 2015-07-23 DIAGNOSIS — Z87891 Personal history of nicotine dependence: Secondary | ICD-10-CM | POA: Diagnosis not present

## 2015-07-23 DIAGNOSIS — T361X5A Adverse effect of cephalosporins and other beta-lactam antibiotics, initial encounter: Secondary | ICD-10-CM | POA: Diagnosis present

## 2015-07-23 DIAGNOSIS — Z8249 Family history of ischemic heart disease and other diseases of the circulatory system: Secondary | ICD-10-CM

## 2015-07-23 DIAGNOSIS — L89152 Pressure ulcer of sacral region, stage 2: Secondary | ICD-10-CM | POA: Diagnosis present

## 2015-07-23 DIAGNOSIS — E870 Hyperosmolality and hypernatremia: Secondary | ICD-10-CM | POA: Diagnosis present

## 2015-07-23 DIAGNOSIS — K8020 Calculus of gallbladder without cholecystitis without obstruction: Secondary | ICD-10-CM | POA: Diagnosis present

## 2015-07-23 DIAGNOSIS — F05 Delirium due to known physiological condition: Secondary | ICD-10-CM | POA: Diagnosis not present

## 2015-07-23 DIAGNOSIS — N179 Acute kidney failure, unspecified: Secondary | ICD-10-CM | POA: Diagnosis present

## 2015-07-23 DIAGNOSIS — Y92230 Patient room in hospital as the place of occurrence of the external cause: Secondary | ICD-10-CM | POA: Diagnosis present

## 2015-07-23 DIAGNOSIS — E1122 Type 2 diabetes mellitus with diabetic chronic kidney disease: Secondary | ICD-10-CM | POA: Diagnosis present

## 2015-07-23 DIAGNOSIS — R740 Nonspecific elevation of levels of transaminase and lactic acid dehydrogenase [LDH]: Secondary | ICD-10-CM | POA: Diagnosis present

## 2015-07-23 DIAGNOSIS — Z794 Long term (current) use of insulin: Secondary | ICD-10-CM

## 2015-07-23 DIAGNOSIS — R1310 Dysphagia, unspecified: Secondary | ICD-10-CM | POA: Diagnosis present

## 2015-07-23 DIAGNOSIS — A419 Sepsis, unspecified organism: Secondary | ICD-10-CM | POA: Diagnosis not present

## 2015-07-23 DIAGNOSIS — Z7982 Long term (current) use of aspirin: Secondary | ICD-10-CM | POA: Diagnosis not present

## 2015-07-23 DIAGNOSIS — J189 Pneumonia, unspecified organism: Secondary | ICD-10-CM | POA: Diagnosis present

## 2015-07-23 DIAGNOSIS — K219 Gastro-esophageal reflux disease without esophagitis: Secondary | ICD-10-CM | POA: Diagnosis present

## 2015-07-23 DIAGNOSIS — Z7901 Long term (current) use of anticoagulants: Secondary | ICD-10-CM | POA: Diagnosis not present

## 2015-07-23 DIAGNOSIS — R400 Somnolence: Secondary | ICD-10-CM | POA: Diagnosis not present

## 2015-07-23 DIAGNOSIS — D649 Anemia, unspecified: Secondary | ICD-10-CM | POA: Diagnosis present

## 2015-07-23 DIAGNOSIS — R791 Abnormal coagulation profile: Secondary | ICD-10-CM | POA: Diagnosis present

## 2015-07-23 DIAGNOSIS — K760 Fatty (change of) liver, not elsewhere classified: Secondary | ICD-10-CM | POA: Diagnosis present

## 2015-07-23 DIAGNOSIS — N189 Chronic kidney disease, unspecified: Secondary | ICD-10-CM | POA: Diagnosis present

## 2015-07-23 DIAGNOSIS — R0902 Hypoxemia: Secondary | ICD-10-CM | POA: Diagnosis present

## 2015-07-23 DIAGNOSIS — Z515 Encounter for palliative care: Secondary | ICD-10-CM | POA: Diagnosis present

## 2015-07-23 DIAGNOSIS — R4182 Altered mental status, unspecified: Secondary | ICD-10-CM | POA: Diagnosis present

## 2015-07-23 DIAGNOSIS — Z95810 Presence of automatic (implantable) cardiac defibrillator: Secondary | ICD-10-CM

## 2015-07-23 DIAGNOSIS — L89610 Pressure ulcer of right heel, unstageable: Secondary | ICD-10-CM | POA: Diagnosis present

## 2015-07-23 DIAGNOSIS — Z833 Family history of diabetes mellitus: Secondary | ICD-10-CM

## 2015-07-23 DIAGNOSIS — E43 Unspecified severe protein-calorie malnutrition: Secondary | ICD-10-CM | POA: Diagnosis present

## 2015-07-23 DIAGNOSIS — Z9181 History of falling: Secondary | ICD-10-CM | POA: Diagnosis not present

## 2015-07-23 DIAGNOSIS — Z66 Do not resuscitate: Secondary | ICD-10-CM | POA: Diagnosis present

## 2015-07-23 DIAGNOSIS — R410 Disorientation, unspecified: Secondary | ICD-10-CM | POA: Diagnosis not present

## 2015-07-23 DIAGNOSIS — R7401 Elevation of levels of liver transaminase levels: Secondary | ICD-10-CM

## 2015-07-23 DIAGNOSIS — L8990 Pressure ulcer of unspecified site, unspecified stage: Secondary | ICD-10-CM | POA: Insufficient documentation

## 2015-07-23 LAB — CBC WITH DIFFERENTIAL/PLATELET
BASOS ABS: 0 10*3/uL (ref 0–0.1)
BASOS PCT: 0 %
Eosinophils Absolute: 0 10*3/uL (ref 0–0.7)
Eosinophils Relative: 0 %
HEMATOCRIT: 30.5 % — AB (ref 40.0–52.0)
Hemoglobin: 9.6 g/dL — ABNORMAL LOW (ref 13.0–18.0)
LYMPHS PCT: 4 %
Lymphs Abs: 0.8 10*3/uL — ABNORMAL LOW (ref 1.0–3.6)
MCH: 28.3 pg (ref 26.0–34.0)
MCHC: 31.6 g/dL — ABNORMAL LOW (ref 32.0–36.0)
MCV: 89.4 fL (ref 80.0–100.0)
MONO ABS: 0.4 10*3/uL (ref 0.2–1.0)
Monocytes Relative: 2 %
NEUTROS ABS: 19.8 10*3/uL — AB (ref 1.4–6.5)
Neutrophils Relative %: 94 %
PLATELETS: 388 10*3/uL (ref 150–440)
RBC: 3.41 MIL/uL — AB (ref 4.40–5.90)
RDW: 15.9 % — AB (ref 11.5–14.5)
WBC: 20.9 10*3/uL — AB (ref 3.8–10.6)

## 2015-07-23 LAB — BASIC METABOLIC PANEL
ANION GAP: 13 (ref 5–15)
BUN: 39 mg/dL — ABNORMAL HIGH (ref 6–20)
CALCIUM: 8.6 mg/dL — AB (ref 8.9–10.3)
CHLORIDE: 112 mmol/L — AB (ref 101–111)
CO2: 19 mmol/L — ABNORMAL LOW (ref 22–32)
Creatinine, Ser: 1.15 mg/dL (ref 0.61–1.24)
GFR calc Af Amer: >60 mL/min (ref >60)
GFR, EST NON AFRICAN AMERICAN: 57 mL/min — AB (ref >60)
Glucose, Bld: 294 mg/dL — ABNORMAL HIGH (ref 65–99)
POTASSIUM: 3.9 mmol/L (ref 3.5–5.1)
SODIUM: 144 mmol/L (ref 135–145)

## 2015-07-23 LAB — APTT: APTT: 43 s — AB (ref 24–36)

## 2015-07-23 LAB — HEPATIC FUNCTION PANEL
ALK PHOS: 214 U/L — AB (ref 38–126)
ALT: 1027 U/L — AB (ref 17–63)
AST: 245 U/L — ABNORMAL HIGH (ref 15–41)
Albumin: 2.6 g/dL — ABNORMAL LOW (ref 3.5–5.0)
BILIRUBIN DIRECT: 0.5 mg/dL (ref 0.1–0.5)
BILIRUBIN INDIRECT: 0.9 mg/dL (ref 0.3–0.9)
Total Bilirubin: 1.4 mg/dL — ABNORMAL HIGH (ref 0.3–1.2)
Total Protein: 5.6 g/dL — ABNORMAL LOW (ref 6.5–8.1)

## 2015-07-23 LAB — PROTIME-INR
INR: 5.24 — AB
Prothrombin Time: 46.5 seconds — ABNORMAL HIGH (ref 11.4–15.0)

## 2015-07-23 LAB — TYPE AND SCREEN
ABO/RH(D): A POS
Antibody Screen: NEGATIVE

## 2015-07-23 LAB — AMMONIA: AMMONIA: 26 umol/L (ref 9–35)

## 2015-07-23 MED ORDER — ACETAMINOPHEN 325 MG PO TABS: 650.0000 mg | ORAL_TABLET | Freq: Four times a day (QID) | ORAL | Status: DC | PRN

## 2015-07-23 MED ORDER — INSULIN ASPART 100 UNIT/ML ~~LOC~~ SOLN
0.0000 [IU] | Freq: Every day | SUBCUTANEOUS | Status: DC
  Administered 2015-07-23: 3 [IU] via SUBCUTANEOUS
  Filled 2015-07-23: qty 3

## 2015-07-23 MED ORDER — SODIUM CHLORIDE 0.9 % IV SOLN: 250.0000 mL | INTRAVENOUS | Status: DC | PRN

## 2015-07-23 MED ORDER — DOCUSATE SODIUM 100 MG PO CAPS
100.0000 mg | ORAL_CAPSULE | Freq: Two times a day (BID) | ORAL | Status: DC
  Administered 2015-07-23: 100 mg via ORAL
  Filled 2015-07-23 (×3): qty 1

## 2015-07-23 MED ORDER — INSULIN ASPART 100 UNIT/ML ~~LOC~~ SOLN
0.0000 [IU] | Freq: Three times a day (TID) | SUBCUTANEOUS | Status: DC
  Administered 2015-07-24 – 2015-07-25 (×4): 2 [IU] via SUBCUTANEOUS
  Administered 2015-07-25: 3 [IU] via SUBCUTANEOUS
  Administered 2015-07-26 (×2): 2 [IU] via SUBCUTANEOUS
  Administered 2015-07-26: 5 [IU] via SUBCUTANEOUS
  Administered 2015-07-27: 2 [IU] via SUBCUTANEOUS
  Administered 2015-07-27: 3 [IU] via SUBCUTANEOUS
  Administered 2015-07-28: 2 [IU] via SUBCUTANEOUS
  Filled 2015-07-23: qty 3
  Filled 2015-07-23: qty 5
  Filled 2015-07-23: qty 2
  Filled 2015-07-23 (×2): qty 3
  Filled 2015-07-23 (×7): qty 2

## 2015-07-23 MED ORDER — ONDANSETRON HCL 4 MG PO TABS: 4.0000 mg | ORAL_TABLET | Freq: Four times a day (QID) | ORAL | Status: DC | PRN

## 2015-07-23 MED ORDER — SODIUM CHLORIDE 0.9 % IJ SOLN: 3.0000 mL | INTRAMUSCULAR | Status: DC | PRN

## 2015-07-23 MED ORDER — DEXTROSE 5 % IV SOLN
500.0000 mg | INTRAVENOUS | Status: DC
  Administered 2015-07-23: 500 mg via INTRAVENOUS
  Filled 2015-07-23 (×2): qty 500

## 2015-07-23 MED ORDER — ACETAMINOPHEN 650 MG RE SUPP: 650.0000 mg | Freq: Four times a day (QID) | RECTAL | Status: DC | PRN

## 2015-07-23 MED ORDER — ONDANSETRON HCL 4 MG/2ML IJ SOLN: 4.0000 mg | Freq: Four times a day (QID) | INTRAMUSCULAR | Status: DC | PRN

## 2015-07-23 MED ORDER — CEFTRIAXONE SODIUM 1 G IJ SOLR
1.0000 g | INTRAMUSCULAR | Status: DC
  Administered 2015-07-23: 1 g via INTRAVENOUS
  Filled 2015-07-23 (×2): qty 10

## 2015-07-23 MED ORDER — BISACODYL 10 MG RE SUPP
10.0000 mg | Freq: Every day | RECTAL | Status: DC | PRN
  Filled 2015-07-23: qty 1

## 2015-07-23 MED ORDER — IPRATROPIUM-ALBUTEROL 0.5-2.5 (3) MG/3ML IN SOLN: 3.0000 mL | RESPIRATORY_TRACT | Status: DC | PRN

## 2015-07-23 MED ORDER — POLYETHYLENE GLYCOL 3350 17 G PO PACK: 17.0000 g | PACK | Freq: Every day | ORAL | Status: DC | PRN

## 2015-07-23 MED ORDER — SODIUM CHLORIDE 0.9 % IJ SOLN
3.0000 mL | Freq: Two times a day (BID) | INTRAMUSCULAR | Status: DC
  Administered 2015-07-23 – 2015-07-28 (×7): 3 mL via INTRAVENOUS

## 2015-07-23 NOTE — ED Provider Notes (Addendum)
[facility] Emergency Department Provider Note  ____________________________________________   I have reviewed the triage vital signs and the nursing notes.   HISTORY  Chief Complaint Abnormal Lab    HPI Riley Sanchez. is a 82 y.o. male with a, complicated past medical history including recent pneumonia, on Levaquin, patient also takes Coumadin. He has a history of A. fib. Patient has no complaints at this time. He did break his hip in November and since that time he has had gradually decreasing and waxing and waning mental status which is been going on for several months. Family states that he is not the same as he was before his hip fracture but that there is been no acute change. He has had no melena no bright red blood per rectum, no hematemesis, no headache, and his chronic cough does seem to be worsening. He states that he feels "pretty good. Most of the history is per family. The patient was sent in here because his INR was elevated in the context of Levaquin.  Past Medical History  Diagnosis Date  . Diabetes mellitus without complication (HCC)   . Hypertension   . A-fib (HCC)   . GERD (gastroesophageal reflux disease)   . CHF (congestive heart failure) (HCC)   . Fracture, femur [facility])     Patient Active Problem List   Diagnosis Date Noted  . Left lower lobe pneumonia 07/20/2015  . COPD exacerbation (HCC) 07/20/2015  . Failure to thrive in adult 07/20/2015  . S/P ORIF (open reduction internal fixation) fracture 07/20/2015  . Generalized weakness 07/20/2015  . Protein-calorie malnutrition, severe 07/17/2015  . Sepsis (HCC) 07/16/2015  . Hyperkalemia 07/15/2015  . Acute kidney injury (HCC) 07/15/2015  . Sacral decubitus ulcer 07/15/2015  . Hip fracture (HCC) 05/18/2015    Past Surgical History  Procedure Laterality Date  . Cardiac surgery      bypass in 89  . Cardiac defibrillator placement      replaced 1/16, medtronic  . Cardiac  defibrillator placement      medtronic 1/16  . Hip pinning,cannulated Left 05/19/2015    Procedure: CANNULATED HIP PINNING;  Surgeon: Juanell Fairly, MD;  Location: [facility] ORS;  Service: Orthopedics;  Laterality: Left;    Current Outpatient Rx  Name  Route  Sig  Dispense  Refill  . acetaminophen (TYLENOL) 325 MG tablet   Oral   Take 650 mg by mouth every 6 (six) hours as needed for mild pain, fever or headache.          Marland Kitchen aspirin EC 81 MG tablet   Oral   Take 81 mg by mouth daily.         . benzonatate (TESSALON) 200 MG capsule   Oral   Take 1 capsule (200 mg total) by mouth 3 (three) times daily as needed for cough.   20 capsule   0   . chlorpheniramine-HYDROcodone (TUSSIONEX) 10-8 MG/5ML SUER   Oral   Take 5 mLs by mouth every 12 (twelve) hours.   140 mL   0   . cholecalciferol (VITAMIN D) 1000 UNITS tablet   Oral   Take 1,000 Units by mouth daily.         . diphenhydrAMINE (BENADRYL) 25 mg capsule   Oral   Take 25 mg by mouth at bedtime as needed for sleep.         Marland Kitchen docusate sodium (COLACE) 100 MG capsule   Oral   Take 1 capsule (100 mg total)  by mouth 2 (two) times daily.   10 capsule   0   . feeding supplement, ENSURE ENLIVE, (ENSURE ENLIVE) LIQD   Oral   Take 237 mLs by mouth 3 (three) times daily with meals.   237 mL   12   . ferrous sulfate 325 (65 FE) MG tablet   Oral   Take 325 mg by mouth daily with breakfast.         . gabapentin (NEURONTIN) 100 MG capsule   Oral   Take 100 mg by mouth at bedtime.         Marland Kitchen guaiFENesin (MUCINEX) 600 MG 12 hr tablet   Oral   Take 1 tablet (600 mg total) by mouth 2 (two) times daily.   20 tablet   3   . guaiFENesin-codeine 100-10 MG/5ML syrup   Oral   Take 10 mLs by mouth every 4 (four) hours as needed for cough.   120 mL   0   . insulin aspart (NOVOLOG) 100 UNIT/ML injection   Subcutaneous   Inject 1-9 Units into the skin 3 (three) times daily with meals as needed for high blood sugar. Pt  uses per sliding scale:    121-150:  1 unit  151-200:  2 units  201-250:  3 units  251-300:  5 units  301-350:  7 units  351-400:  9 units  Greater than 400:  Call MD         . ipratropium-albuterol (DUONEB) 0.5-2.5 (3) MG/3ML SOLN   Nebulization   Take 3 mLs by nebulization every 4 (four) hours as needed.   360 mL   6   . levofloxacin (LEVAQUIN) 500 MG tablet   Oral   Take 1 tablet (500 mg total) by mouth daily.   7 tablet   0   . lisinopril (PRINIVIL,ZESTRIL) 10 MG tablet   Oral   Take 10 mg by mouth daily.         Marland Kitchen lovastatin (MEVACOR) 40 MG tablet   Oral   Take 40 mg by mouth at bedtime.         Marland Kitchen menthol-cetylpyridinium (CEPACOL) 3 MG lozenge   Oral   Take 1 lozenge (3 mg total) by mouth as needed for sore throat.   100 tablet   12   . mometasone-formoterol (DULERA) 200-5 MCG/ACT AERO   Inhalation   Inhale 2 puffs into the lungs 2 (two) times daily.   13 g   0   . omeprazole (PRILOSEC) 20 MG capsule   Oral   Take 20 mg by mouth daily.         Marland Kitchen oxyCODONE (OXY IR/ROXICODONE) 5 MG immediate release tablet   Oral   Take 1 tablet (5 mg total) by mouth every 4 (four) hours as needed for moderate pain.   30 tablet   0   . polyethylene glycol (MIRALAX / GLYCOLAX) packet   Oral   Take 17 g by mouth daily as needed for moderate constipation.          . predniSONE (STERAPRED UNI-PAK 21 TAB) 10 MG (21) TBPK tablet   Oral   Take 1 tablet (10 mg total) by mouth daily. Please take 6 pills in the morning on the first day then taper by one pill daily until finished. Thank you   21 tablet   0   . senna (SENOKOT) 8.6 MG TABS tablet   Oral   Take 1 tablet (8.6 mg total) by mouth daily  as needed for mild constipation.   120 each   0   . tiotropium (SPIRIVA) 18 MCG inhalation capsule   Inhalation   Place 1 capsule (18 mcg total) into inhaler and inhale daily.   30 capsule   12   . warfarin (COUMADIN) 6 MG tablet   Oral   Take 1 tablet (6 mg  total) by mouth daily at 6 PM.   30 tablet   6     Allergies Bactrim  Family History  Problem Relation Age of Onset  . Diabetes Other   . Hypertension Other     Social History Social History  Substance Use Topics  . Smoking status: Former Smoker    Quit date: 07/15/1980  . Smokeless tobacco: None  . Alcohol Use: No    Review of Systems Constitutional: No fever/chills Eyes: No visual changes. ENT: No sore throat. No stiff neck no neck pain Cardiovascular: Denies chest pain. Respiratory: Denies shortness of breath. Positive continuing cough Gastrointestinal:   no vomiting.  No diarrhea.  No constipation. Genitourinary: Negative for dysuria. Musculoskeletal: Negative lower extremity swelling Skin: Negative for rash. Neurological: Negative for headaches, focal weakness or numbness. 10-point ROS otherwise negative.  ____________________________________________   PHYSICAL EXAM:  VITAL SIGNS: ED Triage Vitals  Enc Vitals Group     BP 07/23/15 1251 142/75 mmHg     Pulse Rate 07/23/15 1251 75     Resp 07/23/15 1251 18     Temp 07/23/15 1251 97.6 F (36.4 C)     Temp Source 07/23/15 1251 Oral     SpO2 07/23/15 1251 96 %     Weight 07/23/15 1251 200 lb (90.719 kg)     Height 07/23/15 1251 6' (1.829 m)     Head Cir --      Peak Flow --      Pain Score 07/23/15 1253 0     Pain Loc --      Pain Edu? --      Excl. in GC? --     Constitutional: Alert and oriented to name and place unsure of date. Nontoxic appearing Eyes: Conjunctivae are normal. PERRL. EOMI. Head: Atraumatic. Nose: No congestion/rhinnorhea. Mouth/Throat: Mucous membranes are moist.  Oropharynx non-erythematous. Neck: No stridor.   Nontender with no meningismus Cardiovascular: Normal rate, regular rhythm. Grossly normal heart sounds.  Good peripheral circulation. Respiratory: Normal respiratory effort.  No retractions. Lungs CTAB. Abdominal: Soft and nontender. No distention. No guarding no  rebound Back:  There is no focal tenderness or step off there is no midline tenderness there are no lesions noted. there is no CVA tenderness Rectal exam: Guaiac-negative Musculoskeletal: No lower extremity tenderness. No joint effusions,  Neurologic:  Normal speech and language. No gross focal neurologic deficits are appreciated.  Skin:  Skin is warm, dry and intact. No rash noted. Psychiatric: Mood and affect are normal. Speech and behavior are normal.  ____________________________________________   LABS (all labs ordered are listed, but only abnormal results are displayed)  Labs Reviewed  BASIC METABOLIC PANEL - Abnormal; Notable for the following:    Chloride 112 (*)    CO2 19 (*)    Glucose, Bld 294 (*)    BUN 39 (*)    Calcium 8.6 (*)    GFR calc non Af Amer 57 (*)    All other components within normal limits  CBC WITH DIFFERENTIAL/PLATELET - Abnormal; Notable for the following:    WBC 20.9 (*)    RBC 3.41 (*)  Hemoglobin 9.6 (*)    HCT 30.5 (*)    MCHC 31.6 (*)    RDW 15.9 (*)    Neutro Abs 19.8 (*)    Lymphs Abs 0.8 (*)    All other components within normal limits  AMMONIA  PROTIME-INR  APTT  HEPATIC FUNCTION PANEL  TYPE AND SCREEN   ____________________________________________  EKG  I personally interpreted any EKGs ordered by me or triage 80 sequentially paced rhythm rate 87 bpm ____________________________________________  RADIOLOGY  I reviewed any imaging ordered by me or triage that were performed during my shift ____________________________________________   PROCEDURES  Procedure(s) performed: None  Critical Care performed: None  ____________________________________________   INITIAL IMPRESSION / ASSESSMENT AND PLAN / ED COURSE  Pertinent labs & imaging results that were available during my care of the patient were reviewed by me and considered in my medical decision making (see chart for details).  Consent here because his INR is  elevated in the context of Coumadin and Levaquin. This is not unexpected. There is no evidence of acute bleeding. We'll have him hold his INR and likely give him vitamin K. He has had gradually increasing confusion over the last several months as a result of several different significance also has body including pneumonia and hip fracture. We'll obtain a repeat chest x-ray as he states his cough is persistent and we will obtain a CT scan of his head given his elevated INR and questionable confusion although I have low suspicion for acute bleed, certainly would not be surprising to find a hygroma in this patient. A CT and chest x-ray are reassuring, it is my hope we can give the patient vitamin K and we will discuss changing his antibiotics. ____________________________________________   FINAL CLINICAL IMPRESSION(S) / ED DIAGNOSES  Final diagnoses:  None     Jeanmarie Plant, MD 07/23/15 1357  Jeanmarie Plant, MD 07/23/15 [phone]

## 2015-07-23 NOTE — ED Notes (Signed)
Pt on day 3 of levaquin and predisone

## 2015-07-23 NOTE — H&P (Signed)
[facility] [place] at [facility]   PATIENT NAME: Riley Sanchez    MR#:  [id number]  DATE OF BIRTH:  09-23-1929  DATE OF ADMISSION:  07/23/2015  PRIMARY CARE PHYSICIAN: Sula Rumple, MD   REQUESTING/REFERRING PHYSICIAN: Dr. Alphonzo Lemmings  CHIEF COMPLAINT:   Chief Complaint  Patient presents with  . Abnormal Lab    HISTORY OF PRESENT ILLNESS:  Deuce Paternoster  is a 82 y.o. male with a known history of hypertension, diabetes, atrial fibrillation presents to the emergency room after he was noticed to have INR of 7 on routine labs. In the emergency room patient had a chest x-ray which shows worsening left lower lobe infiltrate with elevated white blood cell count of 20.9. INR is at 5.4. No bleeding. Patient is not oriented to place or time although he is awake and alert. History obtained from old records, ER staff and discussing with family at bedside. Patient has been in and out of the hospital since his admission on 05/18/2015 for left hip fracture. He seemed to have an active lifestyle prior to this. He had his surgery and was discharged to skilled nursing facility. He returned in December for pneumonia with hemoptysis. Was treated for this and returned again in January. Discharged recently on 07/20/2015 to skilled nursing facility with Levaquin. He has had on and off confusion since his fall in November. This seems to have slowly worsened with time per family.  He was also worked up for dysphagia and passed his swallow eval. But is supposed to get further evaluation for swallowing at the nursing home according to the family.   PAST MEDICAL HISTORY:   Past Medical History  Diagnosis Date  . Diabetes mellitus without complication (HCC)   . Hypertension   . A-fib (HCC)   . GERD (gastroesophageal reflux disease)   . CHF (congestive heart failure) (HCC)   . Fracture, femur (HCC)     PAST SURGICAL HISTORY:   Past Surgical History  Procedure Laterality  Date  . Cardiac surgery      bypass in 87  . Cardiac defibrillator placement      replaced 1/16, medtronic  . Cardiac defibrillator placement      medtronic 1/16  . Hip pinning,cannulated Left 05/19/2015    Procedure: CANNULATED HIP PINNING;  Surgeon: Juanell Fairly, MD;  Location: [facility] ORS;  Service: Orthopedics;  Laterality: Left;    SOCIAL HISTORY:   Social History  Substance Use Topics  . Smoking status: Former Smoker    Quit date: 07/15/1980  . Smokeless tobacco: Not on file  . Alcohol Use: No    FAMILY HISTORY:   Family History  Problem Relation Age of Onset  . Diabetes Other   . Hypertension Other     DRUG ALLERGIES:   Allergies  Allergen Reactions  . Bactrim [Sulfamethoxazole-Trimethoprim] Other (See Comments)    Reaction:  Unknown     REVIEW OF SYSTEMS:   Review of Systems  Unable to perform ROS: dementia    MEDICATIONS AT HOME:   Prior to Admission medications   Medication Sig Start Date End Date Taking? Authorizing Provider  acetaminophen (TYLENOL) 325 MG tablet Take 650 mg by mouth every 6 (six) hours as needed for mild pain, fever or headache.    Yes Historical Provider, MD  aspirin EC 81 MG tablet Take 81 mg by mouth daily.   Yes Historical Provider, MD  benzonatate (TESSALON) 200 MG capsule Take 1 capsule (200 mg total) by mouth 3 (  three) times daily as needed for cough. 06/19/15  Yes Ramonita Lab, MD  cholecalciferol (VITAMIN D) 1000 UNITS tablet Take 1,000 Units by mouth daily.   Yes Historical Provider, MD  diphenhydrAMINE (BENADRYL) 25 mg capsule Take 25 mg by mouth at bedtime as needed for sleep.   Yes Historical Provider, MD  docusate sodium (COLACE) 100 MG capsule Take 1 capsule (100 mg total) by mouth 2 (two) times daily. 07/20/15  Yes Katharina Caper, MD  feeding supplement, ENSURE ENLIVE, (ENSURE ENLIVE) LIQD Take 237 mLs by mouth 3 (three) times daily with meals. 06/19/15  Yes Ramonita Lab, MD  ferrous sulfate 325 (65 FE) MG tablet Take 325  mg by mouth daily with breakfast.   Yes Historical Provider, MD  gabapentin (NEURONTIN) 100 MG capsule Take 100 mg by mouth at bedtime.   Yes Historical Provider, MD  guaiFENesin (MUCINEX) 600 MG 12 hr tablet Take 1 tablet (600 mg total) by mouth 2 (two) times daily. 07/20/15  Yes Katharina Caper, MD  guaiFENesin-codeine 100-10 MG/5ML syrup Take 10 mLs by mouth every 4 (four) hours as needed for cough. 07/20/15  Yes Katharina Caper, MD  insulin aspart (NOVOLOG) 100 UNIT/ML injection Inject 1-9 Units into the skin 3 (three) times daily with meals as needed for high blood sugar. Pt uses per sliding scale:    121-150:  1 unit  151-200:  2 units  201-250:  3 units  251-300:  5 units  301-350:  7 units  351-400:  9 units  Greater than 400:  Call MD   Yes Historical Provider, MD  ipratropium-albuterol (DUONEB) 0.5-2.5 (3) MG/3ML SOLN Take 3 mLs by nebulization every 4 (four) hours as needed. 07/20/15  Yes Katharina Caper, MD  levofloxacin (LEVAQUIN) 500 MG tablet Take 1 tablet (500 mg total) by mouth daily. 07/20/15  Yes Katharina Caper, MD  lisinopril (PRINIVIL,ZESTRIL) 10 MG tablet Take 10 mg by mouth daily.   Yes Historical Provider, MD  lovastatin (MEVACOR) 40 MG tablet Take 40 mg by mouth at bedtime.   Yes Historical Provider, MD  menthol-cetylpyridinium (CEPACOL) 3 MG lozenge Take 1 lozenge (3 mg total) by mouth as needed for sore throat. 06/19/15  Yes Ramonita Lab, MD  mometasone-formoterol (DULERA) 200-5 MCG/ACT AERO Inhale 2 puffs into the lungs 2 (two) times daily. 06/19/15  Yes Ramonita Lab, MD  omeprazole (PRILOSEC) 20 MG capsule Take 20 mg by mouth daily.   Yes Historical Provider, MD  oxyCODONE (OXY IR/ROXICODONE) 5 MG immediate release tablet Take 1 tablet (5 mg total) by mouth every 4 (four) hours as needed for moderate pain. 07/20/15  Yes Katharina Caper, MD  polyethylene glycol (MIRALAX / GLYCOLAX) packet Take 17 g by mouth daily as needed for moderate constipation.    Yes Historical Provider, MD   predniSONE (STERAPRED UNI-PAK 21 TAB) 10 MG (21) TBPK tablet Take 1 tablet (10 mg total) by mouth daily. Please take 6 pills in the morning on the first day then taper by one pill daily until finished. Thank you 07/20/15  Yes Katharina Caper, MD  senna (SENOKOT) 8.6 MG TABS tablet Take 1 tablet (8.6 mg total) by mouth daily as needed for mild constipation. 07/20/15  Yes Katharina Caper, MD  tiotropium (SPIRIVA) 18 MCG inhalation capsule Place 1 capsule (18 mcg total) into inhaler and inhale daily. 06/19/15  Yes Ramonita Lab, MD  warfarin (COUMADIN) 6 MG tablet Take 1 tablet (6 mg total) by mouth daily at 6 PM. 07/20/15  Yes Katharina Caper, MD  chlorpheniramine-HYDROcodone (TUSSIONEX) 10-8 MG/5ML SUER Take 5 mLs by mouth every 12 (twelve) hours. 07/20/15   Katharina Caper, MD      VITAL SIGNS:  Blood pressure 130/80, pulse 90, temperature 97.6 F (36.4 C), temperature source Oral, resp. rate 27, height 6' (1.829 m), weight 90.719 kg (200 lb), SpO2 98 %.  PHYSICAL EXAMINATION:  Physical Exam  GENERAL:  82 y.o.-year-old patient lying in the bed with no acute distress.  EYES: Pupils equal, round, reactive to light and accommodation. No scleral icterus. Extraocular muscles intact.  HEENT: Head atraumatic, normocephalic. Oropharynx and nasopharynx clear. No oropharyngeal erythema, moist oral mucosa  NECK:  Supple, no jugular venous distention. No thyroid enlargement, no tenderness.  LUNGS: Normal breath sounds bilaterally, no wheezing, rales, rhonchi. No use of accessory muscles of respiration.  CARDIOVASCULAR: S1, S2 normal. No murmurs, rubs, or gallops.  ABDOMEN: Soft, nontender, nondistended. Bowel sounds present. No organomegaly or mass.  EXTREMITIES: No pedal edema, cyanosis, or clubbing. + 2 pedal & radial pulses b/l.   NEUROLOGIC: Cranial nerves II through XII are intact. No focal Motor or sensory deficits appreciated b/l PSYCHIATRIC: The patient is alert and awake SKIN: Redness and blanching in the  sacral area. Dry ulcer on the right heel  LABORATORY PANEL:   CBC  Recent Labs Lab 07/23/15 1256  WBC 20.9*  HGB 9.6*  HCT 30.5*  PLT 388   ------------------------------------------------------------------------------------------------------------------  Chemistries   Recent Labs Lab 07/23/15 1256 07/23/15 1507  NA 144  --   K 3.9  --   CL 112*  --   CO2 19*  --   GLUCOSE 294*  --   BUN 39*  --   CREATININE 1.15  --   CALCIUM 8.6*  --   AST  --  245*  ALT  --  1027*  ALKPHOS  --  214*  BILITOT  --  1.4*   ------------------------------------------------------------------------------------------------------------------  Cardiac Enzymes No results for input(s): TROPONINI in the last 168 hours. ------------------------------------------------------------------------------------------------------------------  RADIOLOGY:  Dg Chest 2 View  07/23/2015  CLINICAL DATA:  Confusion and cough EXAM: CHEST  2 VIEW COMPARISON:  July 15, 2015 FINDINGS: There is persistent airspace consolidation consistent with pneumonia in left lower lobe laterally and posteriorly. There is a small left effusion. Right lung is clear. Heart is enlarged with pulmonary vascularity within normal limits. Pacemaker leads are attached to the right atrium, right ventricle, and left ventricle. No adenopathy. Patient is status post coronary artery bypass grafting. There is degenerative change in thoracic spine. IMPRESSION: Left lower lobe pneumonia with small left effusion. Right lung clear. Stable cardiomegaly. Followup PA and lateral chest radiographs recommended in 3-4 weeks following trial of antibiotic therapy to ensure resolution and exclude underlying malignancy. Electronically Signed   By: Bretta Bang III M.D.   On: 07/23/2015 13:42   Ct Head Wo Contrast  07/23/2015  CLINICAL DATA:  Altered mental status.  Confusion. EXAM: CT HEAD WITHOUT CONTRAST TECHNIQUE: Contiguous axial images were obtained  from the base of the skull through the vertex without intravenous contrast. COMPARISON:  06/13/2015. FINDINGS: Diffusely enlarged ventricles and subarachnoid spaces. Patchy white matter low density in both cerebral hemispheres. Stable old right external capsule and right cerebellar hemisphere lacunar infarcts. Stable bilateral caudate head lacunar infarcts. No intracranial hemorrhage, mass lesion or CT evidence of acute infarction. Unremarkable bones. Left secretions in the sphenoid sinus bilaterally with residual mucosal thickening and retention cyst on the right and minimal residual mucosal thickening on the left. IMPRESSION: 1.  No acute abnormality. 2. Stable atrophy, chronic small vessel white matter ischemic changes and old lacunar infarcts. 3. Chronic sphenoid sinusitis with mild improvement. Electronically Signed   By: Beckie Salts M.D.   On: 07/23/2015 15:01     IMPRESSION AND PLAN:   * Left lower lobe pneumonia with worsening leukocytosis Patient has been on Levaquin as outpatient. Will change to IV ceftriaxone and azithromycin. Send for sputum cultures.  Nebulizers as needed. Oxygen as needed.  * Supratherapeutic INR Due to being on Coumadin and also antibiotics. Coumadin was held on 07/22/2015 and has trended down from 7 --> 5.4. Hold Coumadin. No bleeding. Hemoglobin stable.  * Dysphagia Patient seems to be eating regular food with supplements at this point. He was supposed to get further testing for swallowing as outpatient per her daughter-in-law at bedside. We will consult speech therapy. From what she has explained it seems like he was supposed to get an EGD. Unclear.  * Insulin-dependent diabetes mellitus Continue home dose of medications. Along with sliding scale insulin.  * Chronic congestive heart failure No signs of fluid overload.  * Hypertension Home medications  * Proximal atrial fibrillation On Coumadin.  * DVT prophylaxis. Patient is on Coumadin with INR  5.4  * Transaminitis Elevated liver function tests. No abdominal pain or nausea or vomiting. Check ultrasound of right upper quadrant. This could be contributing to his elevated INR. Repeat labs in the morning. Consult GI if no improvement.    All the records are reviewed and case discussed with ED provider. Management plans discussed with the patient, family and they are in agreement.  CODE STATUS: FULL  TOTAL TIME TAKING CARE OF THIS PATIENT: 45 minutes.    Milagros Loll R M.D on 07/23/2015 at 4:02 PM  Between 7am to 6pm - Pager - [phone]  After 6pm go to [email or web address] - password [email or web address]  [facility]  Office  [phone]  CC: Primary care physician; Sula Rumple, MD     Note: This dictation was prepared with Dragon dictation along with smaller phrase technology. Any transcriptional errors that result from this process are unintentional.

## 2015-07-23 NOTE — ED Notes (Signed)
INR reported to Dr. Alphonzo Lemmings of 5.3

## 2015-07-23 NOTE — ED Notes (Signed)
Pt arrives via EMS from [place], nursing states critical INR of 7.0 pt 89.9 secs, pt coumdin held 1/18, pt hx of afib

## 2015-07-24 DIAGNOSIS — L8990 Pressure ulcer of unspecified site, unspecified stage: Secondary | ICD-10-CM | POA: Insufficient documentation

## 2015-07-24 LAB — CBC
HCT: 30.9 % — ABNORMAL LOW (ref 40.0–52.0)
HEMOGLOBIN: 9.9 g/dL — AB (ref 13.0–18.0)
MCH: 28 pg (ref 26.0–34.0)
MCHC: 32.1 g/dL (ref 32.0–36.0)
MCV: 87.4 fL (ref 80.0–100.0)
Platelets: 406 10*3/uL (ref 150–440)
RBC: 3.54 MIL/uL — AB (ref 4.40–5.90)
RDW: 16.2 % — ABNORMAL HIGH (ref 11.5–14.5)
WBC: 23.3 10*3/uL — AB (ref 3.8–10.6)

## 2015-07-24 LAB — COMPREHENSIVE METABOLIC PANEL
ALBUMIN: 2.6 g/dL — AB (ref 3.5–5.0)
ALT: 968 U/L — ABNORMAL HIGH (ref 17–63)
ANION GAP: 11 (ref 5–15)
AST: 274 U/L — AB (ref 15–41)
Alkaline Phosphatase: 197 U/L — ABNORMAL HIGH (ref 38–126)
BUN: 37 mg/dL — AB (ref 6–20)
CHLORIDE: 109 mmol/L (ref 101–111)
CO2: 21 mmol/L — AB (ref 22–32)
Calcium: 8.4 mg/dL — ABNORMAL LOW (ref 8.9–10.3)
Creatinine, Ser: 1.01 mg/dL (ref 0.61–1.24)
GFR calc Af Amer: >60 mL/min (ref >60)
GFR calc non Af Amer: >60 mL/min (ref >60)
GLUCOSE: 197 mg/dL — AB (ref 65–99)
POTASSIUM: 3.9 mmol/L (ref 3.5–5.1)
SODIUM: 141 mmol/L (ref 135–145)
Total Bilirubin: 1.9 mg/dL — ABNORMAL HIGH (ref 0.3–1.2)
Total Protein: 5.6 g/dL — ABNORMAL LOW (ref 6.5–8.1)

## 2015-07-24 LAB — PROTIME-INR
INR: 7.94
INR: 6.51 — AB
PROTHROMBIN TIME: 54.8 s — AB (ref 11.4–15.0)
Prothrombin Time: 63.6 seconds — ABNORMAL HIGH (ref 11.4–15.0)

## 2015-07-24 LAB — GLUCOSE, CAPILLARY
GLUCOSE-CAPILLARY: 285 mg/dL — AB (ref 65–99)
GLUCOSE-CAPILLARY: 155 mg/dL — AB (ref 65–99)
GLUCOSE-CAPILLARY: 171 mg/dL — AB (ref 65–99)
Glucose-Capillary: 183 mg/dL — ABNORMAL HIGH (ref 65–99)
Glucose-Capillary: 165 mg/dL — ABNORMAL HIGH (ref 65–99)

## 2015-07-24 MED ORDER — CEFEPIME HCL 2 G IJ SOLR
2.0000 g | Freq: Three times a day (TID) | INTRAMUSCULAR | Status: DC
  Administered 2015-07-24 – 2015-07-28 (×12): 2 g via INTRAVENOUS
  Filled 2015-07-24 (×15): qty 2

## 2015-07-24 MED ORDER — LISINOPRIL 10 MG PO TABS
10.0000 mg | ORAL_TABLET | Freq: Every day | ORAL | Status: DC
  Administered 2015-07-24: 10 mg via ORAL
  Filled 2015-07-24: qty 1

## 2015-07-24 MED ORDER — PRAVASTATIN SODIUM 10 MG PO TABS
10.0000 mg | ORAL_TABLET | Freq: Every day | ORAL | Status: DC
  Filled 2015-07-24: qty 1

## 2015-07-24 MED ORDER — PREDNISONE 10 MG PO TABS
10.0000 mg | ORAL_TABLET | Freq: Every day | ORAL | Status: DC
  Administered 2015-07-24: 10 mg via ORAL
  Filled 2015-07-24: qty 1

## 2015-07-24 MED ORDER — TIOTROPIUM BROMIDE MONOHYDRATE 18 MCG IN CAPS
18.0000 ug | ORAL_CAPSULE | Freq: Every day | RESPIRATORY_TRACT | Status: DC
  Filled 2015-07-24: qty 5

## 2015-07-24 MED ORDER — ENSURE ENLIVE PO LIQD: 237.0000 mL | Freq: Three times a day (TID) | ORAL | Status: DC

## 2015-07-24 MED ORDER — ASPIRIN EC 81 MG PO TBEC
81.0000 mg | DELAYED_RELEASE_TABLET | Freq: Every day | ORAL | Status: DC
  Administered 2015-07-24: 81 mg via ORAL
  Filled 2015-07-24: qty 1

## 2015-07-24 MED ORDER — COLLAGENASE 250 UNIT/GM EX OINT
TOPICAL_OINTMENT | Freq: Every day | CUTANEOUS | Status: DC
  Administered 2015-07-24 – 2015-07-27 (×4): via TOPICAL
  Filled 2015-07-24: qty 30

## 2015-07-24 MED ORDER — FERROUS SULFATE 325 (65 FE) MG PO TABS
325.0000 mg | ORAL_TABLET | Freq: Every day | ORAL | Status: DC
Start: 2015-07-25 — End: 2015-07-28

## 2015-07-24 MED ORDER — PHYTONADIONE 5 MG PO TABS
2.5000 mg | ORAL_TABLET | Freq: Once | ORAL | Status: AC
  Administered 2015-07-24: 2.5 mg via ORAL
  Filled 2015-07-24: qty 1

## 2015-07-24 MED ORDER — ENSURE ENLIVE PO LIQD
237.0000 mL | Freq: Three times a day (TID) | ORAL | Status: DC
  Administered 2015-07-24 (×2): 237 mL via ORAL

## 2015-07-24 NOTE — Consult Note (Signed)
GI Inpatient Consult Note  Reason for Consult: Abnormal LFTS    Attending Requesting Consult: Dr. Juliene Pina  History of Present Illness: Riley Sanchez. is a 82 y.o. male seen for evaluation of abnormal LFTS at the request of Dr. Juliene Pina. PMHx of COPD, atrial fib. Wife reports he has had frequent falls over past few years. Had hip fracture in 05/2015 requiring surgical repair and has been at rehab since. Complicated by a fever requiring admission 07/14/14-07/23/14. Diagnosed w/ pneumonia, during admission had vancomycin, zosyn, and levofloxacin. He was discharged on 1/16 .   Re-admitted on 1/19 for elevated INR, he is on coumadin. Was also found to have significantly elevated transaminases with the coagulopathy. He is unable to provide any history. Wife at bedside, fair historian. She reports a decline in mental status over past 3 days. No specific GI symptoms except questionable diarrhea and loose stools. He has been on a step 3 dysphagia diet, wife reports significant weight loss due to not liking foods at facility. Unsure of amount of weight loss. Wife unsure if has been hypotensive.   Wife denies any high risk behaviors, heavy alcohol use, personal or family history of liver disease.    Past Medical History:  Past Medical History  Diagnosis Date  . Diabetes mellitus without complication (HCC)   . Hypertension   . A-fib (HCC)   . GERD (gastroesophageal reflux disease)   . CHF (congestive heart failure) (HCC)   . Fracture, femur (HCC)     Problem List: Patient Active Problem List   Diagnosis Date Noted  . Pressure ulcer 07/24/2015  . Pneumonia 07/23/2015  . Left lower lobe pneumonia 07/20/2015  . COPD exacerbation (HCC) 07/20/2015  . Failure to thrive in adult 07/20/2015  . S/P ORIF (open reduction internal fixation) fracture 07/20/2015  . Generalized weakness 07/20/2015  . Protein-calorie malnutrition, severe 07/17/2015  . Sepsis (HCC) 07/16/2015  . Hyperkalemia 07/15/2015  . Acute  kidney injury (HCC) 07/15/2015  . Sacral decubitus ulcer 07/15/2015  . Hip fracture (HCC) 05/18/2015    Past Surgical History: Past Surgical History  Procedure Laterality Date  . Cardiac surgery      bypass in 87  . Cardiac defibrillator placement      replaced 1/16, medtronic  . Cardiac defibrillator placement      medtronic 1/16  . Hip pinning,cannulated Left 05/19/2015    Procedure: CANNULATED HIP PINNING;  Surgeon: Juanell Fairly, MD;  Location: [facility] ORS;  Service: Orthopedics;  Laterality: Left;    Allergies: Allergies  Allergen Reactions  . Bactrim [Sulfamethoxazole-Trimethoprim] Other (See Comments)    Reaction:  Unknown     Home Medications: Prescriptions prior to admission  Medication Sig Dispense Refill Last Dose  . acetaminophen (TYLENOL) 325 MG tablet Take 650 mg by mouth every 6 (six) hours as needed for mild pain, fever or headache.    prn at prn  . aspirin EC 81 MG tablet Take 81 mg by mouth daily.   07/23/2015 at 0800  . benzonatate (TESSALON) 200 MG capsule Take 1 capsule (200 mg total) by mouth 3 (three) times daily as needed for cough. 20 capsule 0 prn at prn  . cholecalciferol (VITAMIN D) 1000 UNITS tablet Take 1,000 Units by mouth daily.   07/23/2015 at 0800  . diphenhydrAMINE (BENADRYL) 25 mg capsule Take 25 mg by mouth at bedtime as needed for sleep.   prn at prn  . docusate sodium (COLACE) 100 MG capsule Take 1 capsule (100 mg total) by mouth 2 (  two) times daily. 10 capsule 0 07/22/2015 at Unknown time  . feeding supplement, ENSURE ENLIVE, (ENSURE ENLIVE) LIQD Take 237 mLs by mouth 3 (three) times daily with meals. 237 mL 12 07/23/2015 at Unknown time  . ferrous sulfate 325 (65 FE) MG tablet Take 325 mg by mouth daily with breakfast.   07/23/2015 at 0800  . gabapentin (NEURONTIN) 100 MG capsule Take 100 mg by mouth at bedtime.   07/22/2015 at Unknown time  . guaiFENesin (MUCINEX) 600 MG 12 hr tablet Take 1 tablet (600 mg total) by mouth 2 (two) times daily. 20  tablet 3 07/23/2015 at Unknown time  . guaiFENesin-codeine 100-10 MG/5ML syrup Take 10 mLs by mouth every 4 (four) hours as needed for cough. 120 mL 0 prn at prn  . insulin aspart (NOVOLOG) 100 UNIT/ML injection Inject 1-9 Units into the skin 3 (three) times daily with meals as needed for high blood sugar. Pt uses per sliding scale:    121-150:  1 unit  151-200:  2 units  201-250:  3 units  251-300:  5 units  301-350:  7 units  351-400:  9 units  Greater than 400:  Call MD   07/23/2015 at 1130  . ipratropium-albuterol (DUONEB) 0.5-2.5 (3) MG/3ML SOLN Take 3 mLs by nebulization every 4 (four) hours as needed. 360 mL 6 07/22/2015 at prn  . levofloxacin (LEVAQUIN) 500 MG tablet Take 1 tablet (500 mg total) by mouth daily. 7 tablet 0 07/23/2015 at Unknown time  . lisinopril (PRINIVIL,ZESTRIL) 10 MG tablet Take 10 mg by mouth daily.   07/23/2015 at 0800  . lovastatin (MEVACOR) 40 MG tablet Take 40 mg by mouth at bedtime.   07/22/2015 at Unknown time  . menthol-cetylpyridinium (CEPACOL) 3 MG lozenge Take 1 lozenge (3 mg total) by mouth as needed for sore throat. 100 tablet 12 prn at prn  . mometasone-formoterol (DULERA) 200-5 MCG/ACT AERO Inhale 2 puffs into the lungs 2 (two) times daily. 13 g 0 07/23/2015 at 0800  . omeprazole (PRILOSEC) 20 MG capsule Take 20 mg by mouth daily.   07/23/2015 at 0800  . oxyCODONE (OXY IR/ROXICODONE) 5 MG immediate release tablet Take 1 tablet (5 mg total) by mouth every 4 (four) hours as needed for moderate pain. 30 tablet 0 07/21/2015 at prn  . polyethylene glycol (MIRALAX / GLYCOLAX) packet Take 17 g by mouth daily as needed for moderate constipation.    prn at prn  . predniSONE (STERAPRED UNI-PAK 21 TAB) 10 MG (21) TBPK tablet Take 1 tablet (10 mg total) by mouth daily. Please take 6 pills in the morning on the first day then taper by one pill daily until finished. Thank you 21 tablet 0 07/23/2015 at Unknown time  . senna (SENOKOT) 8.6 MG TABS tablet Take 1 tablet (8.6 mg  total) by mouth daily as needed for mild constipation. 120 each 0 prn at prn  . tiotropium (SPIRIVA) 18 MCG inhalation capsule Place 1 capsule (18 mcg total) into inhaler and inhale daily. 30 capsule 12 07/22/2015 at 0800  . warfarin (COUMADIN) 6 MG tablet Take 1 tablet (6 mg total) by mouth daily at 6 PM. 30 tablet 6 07/21/2015 at unknown  . chlorpheniramine-HYDROcodone (TUSSIONEX) 10-8 MG/5ML SUER Take 5 mLs by mouth every 12 (twelve) hours. 140 mL 0    Home medication reconciliation was completed with the patient.   Scheduled Inpatient Medications:   . aspirin EC  81 mg Oral Daily  . ceFEPime (MAXIPIME) IV  2 g  Intravenous 3 times per day  . docusate sodium  100 mg Oral BID  . feeding supplement (ENSURE ENLIVE)  237 mL Oral TID WC  . feeding supplement (ENSURE ENLIVE)  237 mL Oral TID WC  . [START ON 07/25/2015] ferrous sulfate  325 mg Oral Q breakfast  . insulin aspart  0-5 Units Subcutaneous QHS  . insulin aspart  0-9 Units Subcutaneous TID WC  . lisinopril  10 mg Oral Daily  . pravastatin  10 mg Oral q1800  . predniSONE  10 mg Oral Daily  . sodium chloride  3 mL Intravenous Q12H  . tiotropium  18 mcg Inhalation Daily    Continuous Inpatient Infusions:     PRN Inpatient Medications:  sodium chloride, acetaminophen **OR** acetaminophen, bisacodyl, ipratropium-albuterol, ipratropium-albuterol, ondansetron **OR** ondansetron (ZOFRAN) IV, polyethylene glycol, sodium chloride  Family History: The patient's family history is negative for inflammatory bowel disorders, GI malignancy, or solid organ transplantation.  Social History:  The patient denies ETOH, tobacco, or drug use.   Review of Systems: Unable to obtain.     Physical Examination: BP 139/87 mmHg  Pulse 83  Temp(Src) 97.9 F (36.6 C) (Axillary)  Resp 20  Ht 6' (1.829 m)  Wt 158 lb 9.6 oz (71.94 kg)  BMI 21.51 kg/m2  SpO2 95% Gen: resting in bed, not AA&O HEENT: PEERLA, EOMI, Neck: supple, no JVD or  thyromegaly Chest: CTA bilaterally, no wheezes, crackles, or other adventitious sounds CV: RRR, no m/g/c/r Abd: soft, NT, ND, +BS in all four quadrants; no HSM, guarding, ridigity, or rebound tenderness Ext: no edema, well perfused with 2+ pulses, Skin: no rash or lesions noted Lymph: no LAD  Data: Lab Results  Component Value Date   WBC 23.3* 07/24/2015   HGB 9.9* 07/24/2015   HCT 30.9* 07/24/2015   MCV 87.4 07/24/2015   PLT 406 07/24/2015    Recent Labs Lab 07/20/15 0427 07/23/15 1256 07/24/15 0832  HGB 9.0* 9.6* 9.9*   Lab Results  Component Value Date   NA 141 07/24/2015   K 3.9 07/24/2015   CL 109 07/24/2015   CO2 21* 07/24/2015   BUN 37* 07/24/2015   CREATININE 1.01 07/24/2015   Lab Results  Component Value Date   ALT 968* 07/24/2015   AST 274* 07/24/2015   ALKPHOS 197* 07/24/2015   BILITOT 1.9* 07/24/2015    Recent Labs Lab 07/23/15 1256  07/24/15 1315  APTT 43*  --   --   INR 5.24*  < > 6.51*  < > = values in this interval not displayed.   Assessment/Plan: Mr. Huitron is a 82 y.o. male admitted for abnormal LFTS with severe coagulopathy.   1. Abnormal LFTS - transaminitis, most likely causes are either DILI from recent ABX during admission for pneumonia or ischemic hepatitis. Remarkably LFS were normal 9 days ago. Will check a viral hepatitis panel and tylenol level. Monitor LFTS and INR daily. If no improvement consider liver bx and/ transfer to tertiary care center. MELD 30. Normal CBD on imaging.   2. Coagulopathy - INR trending down since holding warfarin but still critically high. Continue daily INR. Platelets normal.   3. AMS - due to pneumonia? Ammonia normal.   4. Dysphagia - per notes was planning to have EGD as outpatient-not an option w/ severe coagulopathy at this time. On dysphagia diet at facility. Speech consult pending.   Recommendations:  1. Check Tylenol level & viral hepatitis panel.  2. Stop PRN Tylenol  3. Monitor LFTS and  INR daily.  4. Liver bx if continue to worsen.    *Case discussed w/ Dr. Bluford Kaufmann.    Thank you for the consult. Please call with questions or concerns.  Bluford Kaufmann, PA-C [facility]

## 2015-07-24 NOTE — Progress Notes (Signed)
°   07/24/15 1230  Clinical Encounter Type  Visited With Patient and family together  Visit Type Initial  Referral From Nurse  Consult/Referral To Chaplain  Spiritual Encounters  Spiritual Needs Other (Comment)  Stress Factors  Patient Stress Factors Other (Comment)  Family Stress Factors Other (Comment)  Advance Directive education. Chaplain Larose Kells Ext [phone]

## 2015-07-24 NOTE — NC FL2 (Signed)
[place] MEDICAID FL2 LEVEL OF CARE SCREENING TOOL     IDENTIFICATION  Patient Name: Riley Sanchez. Birthdate: [birth year] Sex: male Admission Date (Current Location): 07/23/2015  [place] and [place] Number:  Chiropodist and Address:  [facility], [address], [place], [place] [address]      Provider Number: [id number]  Attending Physician Name and Address:  Adrian Saran, MD  Relative Name and Phone Number:       Current Level of Care: Hospital Recommended Level of Care: Skilled Nursing Facility Prior Approval Number:    Date Approved/Denied:   PASRR Number:  [id number])  Discharge Plan: SNF    Current Diagnoses: Patient Active Problem List   Diagnosis Date Noted  . Pressure ulcer 07/24/2015  . Pneumonia 07/23/2015  . Left lower lobe pneumonia 07/20/2015  . COPD exacerbation (HCC) 07/20/2015  . Failure to thrive in adult 07/20/2015  . S/P ORIF (open reduction internal fixation) fracture 07/20/2015  . Generalized weakness 07/20/2015  . Protein-calorie malnutrition, severe 07/17/2015  . Sepsis (HCC) 07/16/2015  . Hyperkalemia 07/15/2015  . Acute kidney injury (HCC) 07/15/2015  . Sacral decubitus ulcer 07/15/2015  . Hip fracture (HCC) 05/18/2015    Orientation RESPIRATION BLADDER Height & Weight    Self, Time  Normal Incontinent 6' (182.9 cm) 158 lbs.  BEHAVIORAL SYMPTOMS/MOOD NEUROLOGICAL BOWEL NUTRITION STATUS   (None )  (None) Incontinent Diet (Regular )  AMBULATORY STATUS COMMUNICATION OF NEEDS Skin   Extensive Assist Verbally PU Stage and Appropriate Care (Pressure Ulcer Stage II Right Heel; Pressure Ulcer Stage II Posterior Buttocks; Pressure Ulcer Stage II Mid Sacrum; Pressure Ulcer Stage II Left Heel )                       Personal Care Assistance Level of Assistance  Bathing, Feeding, Dressing Bathing Assistance: Limited assistance Feeding assistance: Independent Dressing Assistance: Limited  assistance     Functional Limitations Info  Sight, Hearing, Speech Sight Info: Adequate Hearing Info: Adequate Speech Info: Adequate    SPECIAL CARE FACTORS FREQUENCY  PT (By licensed PT), OT (By licensed OT)     PT Frequency:  (5) OT Frequency:  (5)            Contractures      Additional Factors Info  Isolation Precautions, Code Status, Allergies, Insulin Sliding Scale Code Status Info:  (Full Code ) Allergies Info:  (Bactrim)   Insulin Sliding Scale Info:  (Novolog Insulin Injections 3 times daily) Isolation Precautions Info:  (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 0.9 %  sodium chloride infusion  250 mL Intravenous PRN Milagros Loll, MD      . acetaminophen (TYLENOL) tablet 650 mg  650 mg Oral Q6H PRN Milagros Loll, MD       Or  . acetaminophen (TYLENOL) suppository 650 mg  650 mg Rectal Q6H PRN Milagros Loll, MD      . aspirin EC tablet 81 mg  81 mg Oral Daily Sital Mody, MD      . bisacodyl (DULCOLAX) suppository 10 mg  10 mg Rectal Daily PRN Srikar Sudini, MD      . ceFEPIme (MAXIPIME) 2 g in dextrose 5 % 50 mL IVPB  2 g Intravenous 3 times per day Adrian Saran, MD      .  docusate sodium (COLACE) capsule 100 mg  100 mg Oral BID Milagros Loll, MD   100 mg at 07/23/15 2144  . feeding supplement (ENSURE ENLIVE) (ENSURE ENLIVE) liquid 237 mL  237 mL Oral TID WC Sital Mody, MD   237 mL at 07/24/15 1200  . feeding supplement (ENSURE ENLIVE) (ENSURE ENLIVE) liquid 237 mL  237 mL Oral TID WC Adrian Saran, MD      . Melene Muller ON 07/25/2015] ferrous sulfate tablet 325 mg  325 mg Oral Q breakfast Sital Mody, MD      . insulin aspart (novoLOG) injection 0-5 Units  0-5 Units Subcutaneous QHS Milagros Loll, MD   3 Units at 07/23/15 2146  . insulin aspart (novoLOG) injection 0-9 Units  0-9  Units Subcutaneous TID WC Milagros Loll, MD   2 Units at 07/24/15 1200  . ipratropium-albuterol (DUONEB) 0.5-2.5 (3) MG/3ML nebulizer solution 3 mL  3 mL Nebulization Q4H PRN Srikar Sudini, MD      . ipratropium-albuterol (DUONEB) 0.5-2.5 (3) MG/3ML nebulizer solution 3 mL  3 mL Nebulization Q4H PRN Srikar Sudini, MD      . lisinopril (PRINIVIL,ZESTRIL) tablet 10 mg  10 mg Oral Daily Sital Mody, MD      . ondansetron (ZOFRAN) tablet 4 mg  4 mg Oral Q6H PRN Milagros Loll, MD       Or  . ondansetron (ZOFRAN) injection 4 mg  4 mg Intravenous Q6H PRN Srikar Sudini, MD      . phytonadione (VITAMIN K) tablet 2.5 mg  2.5 mg Oral Once Sital Mody, MD      . polyethylene glycol (MIRALAX / GLYCOLAX) packet 17 g  17 g Oral Daily PRN Srikar Sudini, MD      . pravastatin (PRAVACHOL) tablet 10 mg  10 mg Oral q1800 Sital Mody, MD      . predniSONE (DELTASONE) tablet 10 mg  10 mg Oral Daily Sital Mody, MD      . sodium chloride 0.9 % injection 3 mL  3 mL Intravenous Q12H Srikar Sudini, MD   3 mL at 07/24/15 1000  . sodium chloride 0.9 % injection 3 mL  3 mL Intravenous PRN Srikar Sudini, MD      . tiotropium (SPIRIVA) inhalation capsule 18 mcg  18 mcg Inhalation Daily Adrian Saran, MD         Discharge Medications: Please see discharge summary for a list of discharge medications.  Relevant Imaging Results:  Relevant Lab Results:   Additional Information  (SSN [id number])  Verta Ellen Maylen Waltermire, LCSW

## 2015-07-24 NOTE — Evaluation (Signed)
Clinical/Bedside Swallow Evaluation Patient Details  Name: Riley Sanchez. MRN: [id number] Date of Birth: 1929-08-05  Today's Date: 07/24/2015 Time: SLP Start Time (ACUTE ONLY): 1300 SLP Stop Time (ACUTE ONLY): 1400 SLP Time Calculation (min) (ACUTE ONLY): 60 min  Past Medical History:  Past Medical History  Diagnosis Date  . Diabetes mellitus without complication (HCC)   . Hypertension   . A-fib (HCC)   . GERD (gastroesophageal reflux disease)   . CHF (congestive heart failure) (HCC)   . Fracture, femur [facility])    Past Surgical History:  Past Surgical History  Procedure Laterality Date  . Cardiac surgery      bypass in 87  . Cardiac defibrillator placement      replaced 1/16, medtronic  . Cardiac defibrillator placement      medtronic 1/16  . Hip pinning,cannulated Left 05/19/2015    Procedure: CANNULATED HIP PINNING;  Surgeon: Juanell Fairly, MD;  Location: [facility] ORS;  Service: Orthopedics;  Laterality: Left;   HPI:  Pt is a 82 y.o. male with a known history of HTN, afib, CHF, type 2 diabetes not insulin requiring who is presenting to the ED with INR of 7. He is currently in a nursing facility after recently having hip surgery and another rehospitalization. He had a recent bout of pneumonia post N/V and suspected aspiration of vomit and has finished those antibiotics. Since the time of surgery, he has had declined respiratory status w/ hospitalization. Of note, he has been bedbound since his recent surgery and was due to see orthopedic surgery. Pt is currently resting but has been confused (taking off gown, holding food orally) and has little verbal communication. This is a different presentation from prior. Pt does appear weak and wife stated his diet at the SNF was changed to puree prior to this admission sec. to overall medical/cognitive status'. Wife has been assisting in feeding him. Have suspected pt may have Esophageal dysmotility at previous admission d/t s/s stated. Pt is  drowsy and required max. verbal and tactile stim to awaken to take po's. NSG is wanting to attempt nec. oral meds.     Assessment / Plan / Recommendation Clinical Impression  Pt appears to present w/ increased risk for aspiration sec. to his declined Cognitive status. The declined Cognitive status appears to be impacting overall oral awareness for task of eating/drinking and both the oral and pharyngeal phases of swallowing w/ pt requiring mod-max verbal/tactile cues to follow through w/ taking po trials and swallowing/clearing such. Pt exhibited a congested cough (delayed) w/ the po trials but unsure if directly related to the trials - pt has a baseline congested cough. Pt required feeding and continuous cues to remain awake and swallow/clear. Pt was able to clear his crushed oral meds given w/ NSG. At this time, pt appears at too high of a risk for initiating an oral diet. Rec. continued assessment of pt's alertness for safe toleration of po's for an oral diet. Rec. sttict aspiation precautions w/ any oral meds w/ NSG and hold if nec. ST will f/u tomorrow. NSG and Wife agreed.    Aspiration Risk  Moderate aspiration risk    Diet Recommendation  NPO at this time w/ continued assessment for diet initiation; strict aspiration precautions  Medication Administration: Crushed with puree (if appropriate)    Other  Recommendations Recommended Consults: Consider GI evaluation (when appropriate) Oral Care Recommendations: Oral care QID;Staff/trained caregiver to provide oral care Other Recommendations:  (TBD)   Follow up Recommendations  Skilled  Nursing facility    Frequency and Duration min 3x week  2 weeks       Prognosis Prognosis for Safe Diet Advancement: Guarded (at this time) Barriers to Reach Goals: Cognitive deficits;Severity of deficits      Swallow Study   General Date of Onset: 07/23/15 HPI: Pt is a 82 y.o. male with a known history of HTN, afib, CHF, type 2 diabetes not insulin  requiring who is presenting to the ED with INR of 7. He is currently in a nursing facility after recently having hip surgery and another rehospitalization. He had a recent bout of pneumonia post N/V and suspected aspiration of vomit and has finished those antibiotics. Since the time of surgery, he has had declined respiratory status w/ hospitalization. Of note, he has been bedbound since his recent surgery and was due to see orthopedic surgery. Pt is currently resting but has been confused (taking off gown, holding food orally) and has little verbal communication. This is a different presentation from prior. Pt does appear weak and wife stated his diet at the SNF was changed to puree prior to this admission sec. to overall medical/cognitive status'. Wife has been assisting in feeding him. Have suspected pt may have Esophageal dysmotility at previous admission d/t s/s stated. Pt is drowsy and required max. verbal and tactile stim to awaken to take po's. NSG is wanting to attempt nec. oral meds.   Type of Study: Bedside Swallow Evaluation Previous Swallow Assessment: 1.14.17 Diet Prior to this Study:  (mech soft then Puree at SNF just prior to admission) Temperature Spikes Noted: No (wbc elevated) Respiratory Status: Room air History of Recent Intubation: No Behavior/Cognition: Confused;Distractible;Requires cueing;Doesn't follow directions Oral Cavity Assessment: Dried secretions (unable to formally assess sec. to Cognitive status; residue) Oral Care Completed by SLP:  (attempted) Oral Cavity - Dentition: Adequate natural dentition Vision:  (n/a) Self-Feeding Abilities: Total assist (Cognitive status) Patient Positioning: Upright in bed Baseline Vocal Quality:  (mostly nonverbal) Volitional Cough: Cognitively unable to elicit Volitional Swallow: Unable to elicit    Oral/Motor/Sensory Function Overall Oral Motor/Sensory Function:  (unable to assess sec. to Cognitive status)   Ice Chips Ice chips:  Impaired Presentation: Spoon (3 trials) Oral Phase Impairments: Poor awareness of bolus;Reduced lingual movement/coordination Oral Phase Functional Implications: Prolonged oral transit Pharyngeal Phase Impairments: Cough - Delayed (unsure if related to trials - during 2/3 trials) Other Comments: pt exhibited a congested cough baseline prior to po's   Thin Liquid Thin Liquid: Not tested    Nectar Thick Nectar Thick Liquid: Not tested   Honey Thick Honey Thick Liquid: Not tested   Puree Puree: Impaired Presentation: Spoon (fed; 3 trials) Oral Phase Impairments: Poor awareness of bolus;Reduced lingual movement/coordination Oral Phase Functional Implications: Prolonged oral transit;Oral residue Pharyngeal Phase Impairments: Cough - Delayed (during 1/3 trials - unsure if related to the trials)   Solid   GO   Solid: Not tested        Yusuke Beza 07/24/2015,4:47 PM

## 2015-07-24 NOTE — Consult Note (Signed)
WOC wound consult note Reason for Consult: Unstageable pressure injury to right heel, present on admission Stage 2 pressure injury to sacrum/coccyx, present on admission Wound type: pressure injury Pressure Ulcer POA: Yes Measurement: 2 cm x 2.2 cm x 0.2 cm 100% slough Wound bed:100% slough Drainage (amount, consistency, odor) Minimal serosanguinous drainage  No odor. Periwound:macerated Dressing procedure/placement/frequency:Cleanse right heel with NS and pat gently dry.  Apply Santyl ointment to wound bed.  Cover with NS moist gauze (to wound bed only).  Cover with dry 4x4 gauze and kerlix.  Change daily. Prevalon boot while in bed.   Cleanse sacrum/coccyx with soap and water and pat gently dry.  Apply barrier cream each shift and PRN incontinence.  No disposable briefs or underpads under patient.  Dermatherapy wicking linen under patient only. Turn and reposition to offload pressure.   Will not follow at this time.  Please re-consult if needed.  Maple Hudson RN BSN [facility]ON Pager [phone]

## 2015-07-24 NOTE — Progress Notes (Signed)
Initial Nutrition Assessment  DOCUMENTATION CODES:   Severe malnutrition in context of acute illness/injury however moving towards chronic  INTERVENTION:   Meals and Snacks: Spoke with SLP this afternoon, plan for pt to be NPO today and reevaluate tomorrow for diet order. Medical Food Supplement Therapy: Will recommend continuing Ensure and Magic Cup once diet order advanced.   NUTRITION DIAGNOSIS:   Malnutrition related to acute illness as evidenced by percent weight loss, energy intake < or equal to 50% for > or equal to 5 days, severe depletion of muscle mass, mild depletion of body fat.  GOAL:   Patient will meet greater than or equal to 90% of their needs  MONITOR:    (Energy Intake, Anthropometrics, Electrolyte and renal Profile, Digestive System)  REASON FOR ASSESSMENT:   Consult Poor PO  ASSESSMENT:   Pt admitted with abnormal lab values with recent admission last week with healthcare acquired pna and acute kidney injury. Pt with multiple pressure ulcers. H/o hip surgery 05/19/2015.  Past Medical History  Diagnosis Date  . Diabetes mellitus without complication (HCC)   . Hypertension   . A-fib (HCC)   . GERD (gastroesophageal reflux disease)   . CHF (congestive heart failure) (HCC)   . Fracture, femur [facility])    Past Surgical History  Procedure Laterality Date  . Cardiac surgery      bypass in 89  . Cardiac defibrillator placement      replaced 1/16, medtronic  . Cardiac defibrillator placement      medtronic 1/16  . Hip pinning,cannulated Left 05/19/2015    Procedure: CANNULATED HIP PINNING;  Surgeon: Juanell Fairly, MD;  Location: [facility] ORS;  Service: Orthopedics;  Laterality: Left;    Diet Order:  Diet regular Room service appropriate?: Yes; Fluid consistency:: Thin    Current Nutrition: Pt not eating much today per report. Pt pursing lips together to not take anything including medications earlier today.   Food/Nutrition-Related History: Per wife  pt's appetite has not been well since last admission last week. Pt wife reports pt would only take supplement at meal times but held up hands to show he would always drink the milkshake but 'this much,' RD guesses about one cup/8oz portion. Pt was drinking Ensure well on last admission last week. Pt's wife also mentions that at [facility] the kitchen had sent meals pureed as there was questions of difficulty swallowing but that pt mostly would eat bites of pudding, ice cream or mashed potatoes. Pt's wife reports pt poor po intake really has been since hip surgery November of 2016.   Scheduled Medications:  . aspirin EC  81 mg Oral Daily  . ceFEPime (MAXIPIME) IV  2 g Intravenous 3 times per day  . docusate sodium  100 mg Oral BID  . feeding supplement (ENSURE ENLIVE)  237 mL Oral TID WC  . [START ON 07/25/2015] ferrous sulfate  325 mg Oral Q breakfast  . insulin aspart  0-5 Units Subcutaneous QHS  . insulin aspart  0-9 Units Subcutaneous TID WC  . lisinopril  10 mg Oral Daily  . pravastatin  10 mg Oral q1800  . predniSONE  10 mg Oral Daily  . sodium chloride  3 mL Intravenous Q12H  . tiotropium  18 mcg Inhalation Daily     Electrolyte/Renal Profile and Glucose Profile:   Recent Labs Lab 07/20/15 0427 07/23/15 1256 07/24/15 0832  NA 141 144 141  K 3.3* 3.9 3.9  CL 113* 112* 109  CO2 24 19* 21*  BUN 12 39* 37*  CREATININE 0.81 1.15 1.01  CALCIUM 8.5* 8.6* 8.4*  GLUCOSE 162* 294* 197*   Protein Profile:  Recent Labs Lab 07/23/15 1507 07/24/15 0832  ALBUMIN 2.6* 2.6*    Gastrointestinal Profile: Last BM:  07/22/2015   Nutrition-Focused Physical Exam Findings: Nutrition-Focused physical exam completed. Findings are mild-moderate fat depletion, moderate-severe muscle depletion, and no edema.     Weight Change: Pt's wife reports pt weight of 175lbs in November prior to hip surgery; 10% weight loss in 3 months.   Skin:   Multiple Pressure ulcers, stage II  buttocks   Height:   Ht Readings from Last 1 Encounters:  07/23/15 6' (1.829 m)    Weight:   Wt Readings from Last 1 Encounters:  07/24/15 158 lb 9.6 oz (71.94 kg)   Wt Readings from Last 10 Encounters:  07/24/15 158 lb 9.6 oz (71.94 kg)  07/16/15 146 lb 14.4 oz (66.633 kg)  06/13/15 173 lb 6 oz (78.642 kg)  05/18/15 178 lb (80.74 kg)    BMI:  Body mass index is 21.51 kg/(m^2).  Estimated Nutritional Needs:   Kcal:  BEE: 1293kcals, TEE: (IF 1.2-1.4)(AF 1.2) 1862-2181kcals  Protein:  73-86g protein (1.1-1.3g/kg)  Fluid:  1650-1920mL of fluid (25-110mL/kg)  EDUCATION NEEDS:   No education needs identified at this time   HIGH Care Level  Leda Quail, RD, LDN Pager [phone] Weekend/On-Call Pager [phone]

## 2015-07-24 NOTE — Progress Notes (Signed)
[facility] - [place] at [facility]   PATIENT NAME: Riley Sanchez    MR#:  [id number]  DATE OF BIRTH:  1930-06-11  SUBJECTIVE:  Family bedside. Patient appears to be confused  REVIEW OF SYSTEMS:    Review of Systems  Unable to perform ROS: medical condition    Tolerating Diet: No      DRUG ALLERGIES:   Allergies  Allergen Reactions  . Bactrim [Sulfamethoxazole-Trimethoprim] Other (See Comments)    Reaction:  Unknown     VITALS:  Blood pressure 139/87, pulse 83, temperature 97.9 F (36.6 C), temperature source Axillary, resp. rate 20, height 6' (1.829 m), weight 71.94 kg (158 lb 9.6 oz), SpO2 95 %.  PHYSICAL EXAMINATION:   Physical Exam  Constitutional: He is well-developed, well-nourished, and in no distress. No distress.  HENT:  Head: Normocephalic.  Eyes: No scleral icterus.  Neck: Normal range of motion. Neck supple. No JVD present. No tracheal deviation present.  Cardiovascular: Normal rate, regular rhythm and normal heart sounds.  Exam reveals no gallop and no friction rub.   No murmur heard. Pulmonary/Chest: Effort normal and breath sounds normal. No respiratory distress. He has no wheezes. He has no rales. He exhibits no tenderness.  Abdominal: Soft. Bowel sounds are normal. He exhibits no distension and no mass. There is no tenderness. There is no rebound and no guarding.  Musculoskeletal: Normal range of motion. He exhibits no edema.  Neurological: He is alert.  Skin: Skin is warm. No rash noted. No erythema.      LABORATORY PANEL:   CBC  Recent Labs Lab 07/24/15 0832  WBC 23.3*  HGB 9.9*  HCT 30.9*  PLT 406   ------------------------------------------------------------------------------------------------------------------  Chemistries   Recent Labs Lab 07/24/15 0832  NA 141  K 3.9  CL 109  CO2 21*  GLUCOSE 197*  BUN 37*  CREATININE 1.01  CALCIUM 8.4*  AST 274*  ALT 968*  ALKPHOS 197*  BILITOT 1.9*    ------------------------------------------------------------------------------------------------------------------  Cardiac Enzymes No results for input(s): TROPONINI in the last 168 hours. ------------------------------------------------------------------------------------------------------------------  RADIOLOGY:  Dg Chest 2 View  07/23/2015  CLINICAL DATA:  Confusion and cough EXAM: CHEST  2 VIEW COMPARISON:  July 15, 2015 FINDINGS: There is persistent airspace consolidation consistent with pneumonia in left lower lobe laterally and posteriorly. There is a small left effusion. Right lung is clear. Heart is enlarged with pulmonary vascularity within normal limits. Pacemaker leads are attached to the right atrium, right ventricle, and left ventricle. No adenopathy. Patient is status post coronary artery bypass grafting. There is degenerative change in thoracic spine. IMPRESSION: Left lower lobe pneumonia with small left effusion. Right lung clear. Stable cardiomegaly. Followup PA and lateral chest radiographs recommended in 3-4 weeks following trial of antibiotic therapy to ensure resolution and exclude underlying malignancy. Electronically Signed   By: Bretta Bang III M.D.   On: 07/23/2015 13:42   Ct Head Wo Contrast  07/23/2015  CLINICAL DATA:  Altered mental status.  Confusion. EXAM: CT HEAD WITHOUT CONTRAST TECHNIQUE: Contiguous axial images were obtained from the base of the skull through the vertex without intravenous contrast. COMPARISON:  06/13/2015. FINDINGS: Diffusely enlarged ventricles and subarachnoid spaces. Patchy white matter low density in both cerebral hemispheres. Stable old right external capsule and right cerebellar hemisphere lacunar infarcts. Stable bilateral caudate head lacunar infarcts. No intracranial hemorrhage, mass lesion or CT evidence of acute infarction. Unremarkable bones. Left secretions in the sphenoid sinus bilaterally with residual mucosal thickening and  retention cyst on the right and minimal residual mucosal thickening on the left. IMPRESSION: 1. No acute abnormality. 2. Stable atrophy, chronic small vessel white matter ischemic changes and old lacunar infarcts. 3. Chronic sphenoid sinusitis with mild improvement. Electronically Signed   By: Beckie Salts M.D.   On: 07/23/2015 15:01   US Abdomen Limited Ruq  07/23/2015  CLINICAL DATA:  Elevated liver function tests. EXAM: US ABDOMEN LIMITED - RIGHT UPPER QUADRANT COMPARISON:  None. FINDINGS: Gallbladder: 7 mm gallstone in the gallbladder. No gallbladder wall thickening or pericholecystic fluid. The patient was not focally tender over the gallbladder. Common bile duct: Diameter: 2.5 mm Liver: Mildly diffusely echogenic.  No masses or biliary ductal dilatation. IMPRESSION: 1. Cholelithiasis. 2. Mildly echogenic liver, most likely due to steatosis. Electronically Signed   By: Beckie Salts M.D.   On: 07/23/2015 17:50     ASSESSMENT AND PLAN:   82 year old male with a history of hypertension, diabetes and atrial fibrillation who presented with an INR of 7 and found to have pneumonia.  1. Left lower lobe pneumonia with worsening leukocytosis: Patient was being treated for community-acquired pneumonia on Levaquin. Patient's white blood cell count is elevated this morning. He was started on ceftriaxone and azithromycin yesterday. I feel the patient needs broader spectrum coverage and therefore I have started cefepime. Speech consultation is pending. Continue O2 and nebulizers as needed. Sputum cultures pending.  2. Supratherapeutic INR: Patient's INR still elevated greater than 7 this morning. I will order vitamin K 2.5 mg a repeat INR for a.m.  3. Dysphagia Family is complaining that patient has had episodes of dysphagia. Speech consultation is pending.  4. Metabolic encephalopathy: This is due to a combination of pneumonia as well as recent hospitalizations with delirium. Continue to monitor patient's  mental status. Continue to orient the patient. Lites should be on during the day and off at night. This is discussed with family at bedside.  5. Insulin-dependent diabetes: Continue sliding scale insulin, ADA diet and home medications.  6. Chronic diastolic heart failure: No signs of fluid overload.  7. Paroxysmal atrial fibrillation: Heart rate is controlled. Continue monitoring INR. Coumadin is on hold due to supratherapeutic INR. 8. Numerous wounds on heels and stage II pressure ulcer on buttocks present on admission: Wound care consult will be placed 9. Elevated LFT: Patient is asymptomatic however patient does have significant transaminitis. Right of Crohn ultrasound shows cholelithiasis. I will have GI consultation regarding the elevation in LFTs.  10. Essential hypertension: Continue lisinopril  Management plans discussed with the patient's family and they are in agreement.  CODE STATUS: she  TOTAL TIME TAKING CARE OF THIS PATIENT: 40 minutes.     POSSIBLE D/C 3-5 days, DEPENDING ON CLINICAL CONDITION.   Aalijah Mims M.D on 07/24/2015 at 12:39 PM  Between 7am to 6pm - Pager - [phone] After 6pm go to [email or web address] - password [email or web address]  [facility]  Office  [phone]  CC: Primary care physician; Sula Rumple, MD  Note: This dictation was prepared with Dragon dictation along with smaller phrase technology. Any transcriptional errors that result from this process are unintentional.

## 2015-07-24 NOTE — Clinical Social Work Note (Signed)
Clinical Social Work Assessment  Patient Details  Name: Riley Sanchez. MRN: [id number] Date of Birth: 1929-07-09  Date of referral:  07/24/15               Reason for consult:  Discharge Planning                Permission sought to share information with:  Facility Sport and exercise psychologist, Family Supports Permission granted to share information::  Yes, Verbal Permission Granted  Name::        Agency::   ([facility] )  Relationship::   Juergen Hardenbrook [phone])  Contact Information:     Housing/Transportation Living arrangements for the past 2 months:  [place] Licensed conveyancer ) Source of Information:  Patient, Spouse Patient Interpreter Needed:  None Criminal Activity/Legal Involvement Pertinent to Current Situation/Hospitalization:  No - Comment as needed Significant Relationships:  Spouse Lives with:  Spouse, Facility Resident ([facility] ) Do you feel safe going back to the place where you live?  Yes Need for family participation in patient care:  Yes (Comment)  Care giving concerns:  Patient is a STR resident at [facility].    Social Worker assessment / plan:  CSW met with patient and his wife Riley Sanchez [phone] at bedside. Patient was laying in bed watching television. CSW explained her role. Patient provided CSW verbal permission to speak to his wife Marcie Bal about discharge plans. Per Marcie Bal patient is from [facility]. She reports that patient will return to [facility] at discharge. Verbal permission granted to speak to [facility] staff to coordinate discharge. Marcie Bal requested that [employer] transport patient back to facility at discharge. She reports if the van isn't available she's agreeable being transported by EMS to [facility].   CSW contacted Joann, admissions coordinator with [facility]. Per Arville Go patient can return "if we can meet his needs". CSW inquired about Lucianne Lei transport to [place] at discharge for patient. She reports that the  transportation services for [facility] does not operate during the weekend. Per Sandy Salaam would have to coordinate transportation in advanced during the week. She reports "there is no guarantee and a lot of factors that have to be considered".   FL2/ PASRR completed and faxed to [facility]. CSW will continue to follow and assist.   Employment status:  Retired Forensic scientist:  Medicare PT Recommendations:  Not assessed at this time Information / Referral to community resources:  [place]  Patient/Family's Response to care:   Patient and his wife are agreeable to patient returning to [place] at discharge.   Patient/Family's Understanding of and Emotional Response to Diagnosis, Current Treatment, and Prognosis:  Patient and his wife were pleasant. Patient's wife reports that she's grateful for CSW's assistance through patient's past 3 hospital admissions.   Emotional Assessment Appearance:  Appears stated age Attitude/Demeanor/Rapport:   (None ) Affect (typically observed):  Calm, Pleasant Orientation:  Oriented to Self, Oriented to Place Alcohol / Substance use:  Not Applicable Psych involvement (Current and /or in the community):  No (Comment)  Discharge Needs  Concerns to be addressed:  Discharge Planning Concerns Readmission within the last 30 days:  Yes Current discharge risk:  Chronically ill Barriers to Discharge:  Continued Medical Work up   [employer], LCSW 07/24/2015, 1:22 PM

## 2015-07-24 NOTE — Progress Notes (Signed)
PT Cancellation Note  Patient Details Name: Riley Sanchez. MRN: [id number] DOB: 1930/06/01   Cancelled Treatment:    Reason Eval/Treat Not Completed: Medical issues which prohibited therapy (Consult received and chart reviewed. Patient noted with critically elevated INR (>7.0), contraindicated for mobility attempts at this time.  Aslo, patient to be agitated, confused, not participating wtih care.  Will re-attempt next date as medically appropriate.)   Aviyah Swetz H. Manson Passey, PT, DPT, NCS 07/24/2015, 11:53 AM [phone]

## 2015-07-24 NOTE — Progress Notes (Signed)
ANTIBIOTIC CONSULT NOTE - INITIAL  Pharmacy Consult for Cefepime Indication: HCAP  Allergies  Allergen Reactions  . Bactrim [Sulfamethoxazole-Trimethoprim] Other (See Comments)    Reaction:  Unknown     Patient Measurements: Height: 6' (182.9 cm) Weight: 158 lb 9.6 oz (71.94 kg) IBW/kg (Calculated) : 77.6 Adjusted Body Weight: na  Vital Signs: Temp: 97.9 F (36.6 C) (01/20 0615) Temp Source: Axillary (01/20 0615) BP: 139/87 mmHg (01/20 0615) Pulse Rate: 83 (01/20 0615) Intake/Output from previous day: 01/19 0701 - 01/20 0700 In: 673 [P.O.:420; I.V.:3; IV Piggyback:250] Out: 0  Intake/Output from this shift: Total I/O In: 60 [P.O.:60] Out: 0   Labs:  Recent Labs  07/23/15 1256 07/24/15 0832  WBC 20.9* 23.3*  HGB 9.6* 9.9*  PLT 388 406  CREATININE 1.15 1.01   Estimated Creatinine Clearance: 56.4 mL/min (by C-G formula based on Cr of 1.01). No results for input(s): VANCOTROUGH, VANCOPEAK, VANCORANDOM, GENTTROUGH, GENTPEAK, GENTRANDOM, TOBRATROUGH, TOBRAPEAK, TOBRARND, AMIKACINPEAK, AMIKACINTROU, AMIKACIN in the last 72 hours.   Microbiology: Recent Results (from the past 720 hour(s))  Blood culture (routine x 2)     Status: None   Collection Time: 07/15/15  7:30 PM  Result Value Ref Range Status   Specimen Description BLOOD RIGHT ASSIST CONTROL  Final   Special Requests   Final    BOTTLES DRAWN AEROBIC AND ANAEROBIC ANA 2CC AERO 4CC   Culture NO GROWTH 6 DAYS  Final   Report Status 07/21/2015 FINAL  Final  Blood culture (routine x 2)     Status: None   Collection Time: 07/15/15  7:47 PM  Result Value Ref Range Status   Specimen Description BLOOD RIGHT ASSIST CONTROL  Final   Special Requests BOTTLES DRAWN AEROBIC AND ANAEROBIC 2CC  Final   Culture NO GROWTH 6 DAYS  Final   Report Status 07/21/2015 FINAL  Final  C difficile quick scan w PCR reflex     Status: None   Collection Time: 07/16/15  2:50 AM  Result Value Ref Range Status   C Diff antigen  NEGATIVE NEGATIVE Final   C Diff toxin NEGATIVE NEGATIVE Final   C Diff interpretation Negative for C. difficile  Final  MRSA PCR Screening     Status: None   Collection Time: 07/16/15  6:08 AM  Result Value Ref Range Status   MRSA by PCR NEGATIVE NEGATIVE Final    Comment:        The GeneXpert MRSA Assay (FDA approved for NASAL specimens only), is one component of a comprehensive MRSA colonization surveillance program. It is not intended to diagnose MRSA infection nor to guide or monitor treatment for MRSA infections.   Culture, expectorated sputum-assessment     Status: None   Collection Time: 07/17/15  8:30 AM  Result Value Ref Range Status   Specimen Description EXPECTORATED SPUTUM  Final   Special Requests Normal  Final   Sputum evaluation THIS SPECIMEN IS ACCEPTABLE FOR SPUTUM CULTURE  Final   Report Status 07/17/2015 FINAL  Final  Culture, respiratory (NON-Expectorated)     Status: None   Collection Time: 07/17/15  8:30 AM  Result Value Ref Range Status   Specimen Description EXPECTORATED SPUTUM  Final   Special Requests Normal Reflexed from [id number]  Final   Gram Stain   Final    GOOD SPECIMEN - 80-90% WBCS MANY WBC SEEN FEW GRAM POSITIVE COCCI IN PAIRS IN CLUSTERS FEW YEAST    Culture Consistent with normal respiratory flora.  Final   Report  Status 07/20/2015 FINAL  Final  Culture, blood (routine x 2)     Status: None (Preliminary result)   Collection Time: 07/23/15  3:59 PM  Result Value Ref Range Status   Specimen Description BLOOD LEFT WRIST  Final   Special Requests BOTTLES DRAWN AEROBIC AND ANAEROBIC  1CC  Final   Culture NO GROWTH < 24 HOURS  Final   Report Status PENDING  Incomplete  Culture, blood (routine x 2)     Status: None (Preliminary result)   Collection Time: 07/23/15  3:59 PM  Result Value Ref Range Status   Specimen Description BLOOD RIGHT ANTECUBITAL  Final   Special Requests BOTTLES DRAWN AEROBIC AND ANAEROBIC  2CC  Final   Culture NO  GROWTH < 24 HOURS  Final   Report Status PENDING  Incomplete    Medical History: Past Medical History  Diagnosis Date  . Diabetes mellitus without complication (HCC)   . Hypertension   . A-fib (HCC)   . GERD (gastroesophageal reflux disease)   . CHF (congestive heart failure) (HCC)   . Fracture, femur (HCC)     Medications:  Prescriptions prior to admission  Medication Sig Dispense Refill Last Dose  . acetaminophen (TYLENOL) 325 MG tablet Take 650 mg by mouth every 6 (six) hours as needed for mild pain, fever or headache.    prn at prn  . aspirin EC 81 MG tablet Take 81 mg by mouth daily.   07/23/2015 at 0800  . benzonatate (TESSALON) 200 MG capsule Take 1 capsule (200 mg total) by mouth 3 (three) times daily as needed for cough. 20 capsule 0 prn at prn  . cholecalciferol (VITAMIN D) 1000 UNITS tablet Take 1,000 Units by mouth daily.   07/23/2015 at 0800  . diphenhydrAMINE (BENADRYL) 25 mg capsule Take 25 mg by mouth at bedtime as needed for sleep.   prn at prn  . docusate sodium (COLACE) 100 MG capsule Take 1 capsule (100 mg total) by mouth 2 (two) times daily. 10 capsule 0 07/22/2015 at Unknown time  . feeding supplement, ENSURE ENLIVE, (ENSURE ENLIVE) LIQD Take 237 mLs by mouth 3 (three) times daily with meals. 237 mL 12 07/23/2015 at Unknown time  . ferrous sulfate 325 (65 FE) MG tablet Take 325 mg by mouth daily with breakfast.   07/23/2015 at 0800  . gabapentin (NEURONTIN) 100 MG capsule Take 100 mg by mouth at bedtime.   07/22/2015 at Unknown time  . guaiFENesin (MUCINEX) 600 MG 12 hr tablet Take 1 tablet (600 mg total) by mouth 2 (two) times daily. 20 tablet 3 07/23/2015 at Unknown time  . guaiFENesin-codeine 100-10 MG/5ML syrup Take 10 mLs by mouth every 4 (four) hours as needed for cough. 120 mL 0 prn at prn  . insulin aspart (NOVOLOG) 100 UNIT/ML injection Inject 1-9 Units into the skin 3 (three) times daily with meals as needed for high blood sugar. Pt uses per sliding scale:     121-150:  1 unit  151-200:  2 units  201-250:  3 units  251-300:  5 units  301-350:  7 units  351-400:  9 units  Greater than 400:  Call MD   07/23/2015 at 1130  . ipratropium-albuterol (DUONEB) 0.5-2.5 (3) MG/3ML SOLN Take 3 mLs by nebulization every 4 (four) hours as needed. 360 mL 6 07/22/2015 at prn  . levofloxacin (LEVAQUIN) 500 MG tablet Take 1 tablet (500 mg total) by mouth daily. 7 tablet 0 07/23/2015 at Unknown time  . lisinopril (  PRINIVIL,ZESTRIL) 10 MG tablet Take 10 mg by mouth daily.   07/23/2015 at 0800  . lovastatin (MEVACOR) 40 MG tablet Take 40 mg by mouth at bedtime.   07/22/2015 at Unknown time  . menthol-cetylpyridinium (CEPACOL) 3 MG lozenge Take 1 lozenge (3 mg total) by mouth as needed for sore throat. 100 tablet 12 prn at prn  . mometasone-formoterol (DULERA) 200-5 MCG/ACT AERO Inhale 2 puffs into the lungs 2 (two) times daily. 13 g 0 07/23/2015 at 0800  . omeprazole (PRILOSEC) 20 MG capsule Take 20 mg by mouth daily.   07/23/2015 at 0800  . oxyCODONE (OXY IR/ROXICODONE) 5 MG immediate release tablet Take 1 tablet (5 mg total) by mouth every 4 (four) hours as needed for moderate pain. 30 tablet 0 07/21/2015 at prn  . polyethylene glycol (MIRALAX / GLYCOLAX) packet Take 17 g by mouth daily as needed for moderate constipation.    prn at prn  . predniSONE (STERAPRED UNI-PAK 21 TAB) 10 MG (21) TBPK tablet Take 1 tablet (10 mg total) by mouth daily. Please take 6 pills in the morning on the first day then taper by one pill daily until finished. Thank you 21 tablet 0 07/23/2015 at Unknown time  . senna (SENOKOT) 8.6 MG TABS tablet Take 1 tablet (8.6 mg total) by mouth daily as needed for mild constipation. 120 each 0 prn at prn  . tiotropium (SPIRIVA) 18 MCG inhalation capsule Place 1 capsule (18 mcg total) into inhaler and inhale daily. 30 capsule 12 07/22/2015 at 0800  . warfarin (COUMADIN) 6 MG tablet Take 1 tablet (6 mg total) by mouth daily at 6 PM. 30 tablet 6 07/21/2015 at  unknown  . chlorpheniramine-HYDROcodone (TUSSIONEX) 10-8 MG/5ML SUER Take 5 mLs by mouth every 12 (twelve) hours. 140 mL 0    Anti-infectives    Start     Dose/Rate Route Frequency Ordered Stop   07/24/15 1400  ceFEPIme (MAXIPIME) 2 g in dextrose 5 % 50 mL IVPB     2 g 100 mL/hr over 30 Minutes Intravenous 3 times per day 07/24/15 1258     07/23/15 1630  cefTRIAXone (ROCEPHIN) 1 g in dextrose 5 % 50 mL IVPB  Status:  Discontinued     1 g 100 mL/hr over 30 Minutes Intravenous Every 24 hours 07/23/15 1619 07/24/15 1239   07/23/15 1630  azithromycin (ZITHROMAX) 500 mg in dextrose 5 % 250 mL IVPB  Status:  Discontinued     500 mg 250 mL/hr over 60 Minutes Intravenous Every 24 hours 07/23/15 1619 07/24/15 1239     Assessment: Patient is a 82yo recently discharged on Levaquin for pneumonia. Now with worsening pneumonia on CXR. Initially started on ceftriaxone and azithromycin. MD now switching to Cefepime to broaden coverage.  Goal of Therapy:  Resolution of symptoms  Plan:  Follow up culture results Will order Cefepime 2g IV q8h.  Clovia Cuff, PharmD, BCPS 07/24/2015 1:05 PM

## 2015-07-24 NOTE — Progress Notes (Signed)
°   07/24/15 1230  Clinical Encounter Type  Visited With Patient and family together;Health care provider  Visit Type Initial  Referral From Nurse  Consult/Referral To Chaplain  Spiritual Encounters  Spiritual Needs Emotional  Stress Factors  Patient Stress Factors Health changes  Family Stress Factors Health changes  Responded to Code Stroke.  Offered support and presence.  Chaplain Larose Kells Ext [phone]

## 2015-07-25 LAB — CBC
HEMATOCRIT: 31.5 % — AB (ref 40.0–52.0)
HEMOGLOBIN: 10.3 g/dL — AB (ref 13.0–18.0)
MCH: 28.7 pg (ref 26.0–34.0)
MCHC: 32.6 g/dL (ref 32.0–36.0)
MCV: 88 fL (ref 80.0–100.0)
Platelets: 392 10*3/uL (ref 150–440)
RBC: 3.58 MIL/uL — AB (ref 4.40–5.90)
RDW: 16.2 % — ABNORMAL HIGH (ref 11.5–14.5)
WBC: 17.3 10*3/uL — ABNORMAL HIGH (ref 3.8–10.6)

## 2015-07-25 LAB — COMPREHENSIVE METABOLIC PANEL
ALBUMIN: 2.5 g/dL — AB (ref 3.5–5.0)
ALT: 701 U/L — ABNORMAL HIGH (ref 17–63)
ANION GAP: 10 (ref 5–15)
AST: 98 U/L — ABNORMAL HIGH (ref 15–41)
Alkaline Phosphatase: 187 U/L — ABNORMAL HIGH (ref 38–126)
BILIRUBIN TOTAL: 2.5 mg/dL — AB (ref 0.3–1.2)
BUN: 42 mg/dL — ABNORMAL HIGH (ref 6–20)
CO2: 21 mmol/L — ABNORMAL LOW (ref 22–32)
Calcium: 8.5 mg/dL — ABNORMAL LOW (ref 8.9–10.3)
Chloride: 119 mmol/L — ABNORMAL HIGH (ref 101–111)
Creatinine, Ser: 0.98 mg/dL (ref 0.61–1.24)
GFR calc non Af Amer: >60 mL/min (ref >60)
GLUCOSE: 196 mg/dL — AB (ref 65–99)
POTASSIUM: 4.4 mmol/L (ref 3.5–5.1)
SODIUM: 150 mmol/L — AB (ref 135–145)
TOTAL PROTEIN: 5.4 g/dL — AB (ref 6.5–8.1)

## 2015-07-25 LAB — GLUCOSE, CAPILLARY
GLUCOSE-CAPILLARY: 173 mg/dL — AB (ref 65–99)
Glucose-Capillary: 192 mg/dL — ABNORMAL HIGH (ref 65–99)
Glucose-Capillary: 220 mg/dL — ABNORMAL HIGH (ref 65–99)
Glucose-Capillary: 196 mg/dL — ABNORMAL HIGH (ref 65–99)

## 2015-07-25 LAB — ACETAMINOPHEN LEVEL

## 2015-07-25 LAB — PROTIME-INR
INR: 4.91 — AB
Prothrombin Time: 44.3 seconds — ABNORMAL HIGH (ref 11.4–15.0)

## 2015-07-25 LAB — SODIUM: SODIUM: 149 mmol/L — AB (ref 135–145)

## 2015-07-25 MED ORDER — DEXTROSE 5 % IV SOLN
INTRAVENOUS | Status: DC
  Administered 2015-07-25 – 2015-07-26 (×4): via INTRAVENOUS

## 2015-07-25 NOTE — Progress Notes (Signed)
[facility] [place] [facility]   PATIENT NAME: Riley Sanchez    MR#:  [id number]  DATE OF BIRTH:  1930/06/11  SUBJECTIVE:   Patient remains to be confused and has his eyes closed.  REVIEW OF SYSTEMS:    Review of Systems  Unable to perform ROS: medical condition    Tolerating Diet patient is nothing by mouth as per speech consultation with strict aspiration cautions     DRUG ALLERGIES:   Allergies  Allergen Reactions  . Bactrim [Sulfamethoxazole-Trimethoprim] Other (See Comments)    Reaction:  Unknown     VITALS:  Blood pressure 143/65, pulse 81, temperature 99.4 F (37.4 C), temperature source Axillary, resp. rate 21, height 6' (1.829 m), weight 70.444 kg (155 lb 4.8 oz), SpO2 96 %.  PHYSICAL EXAMINATION:   Physical Exam  Constitutional: He is well-developed, well-nourished, and in no distress. No distress.  HENT:  Head: Normocephalic.  Eyes: No scleral icterus.  Neck: Normal range of motion. Neck supple. No JVD present. No tracheal deviation present.  Cardiovascular: Normal rate, regular rhythm and normal heart sounds.  Exam reveals no gallop and no friction rub.   No murmur heard. Pulmonary/Chest: Effort normal and breath sounds normal. No respiratory distress. He has no wheezes. He has no rales. He exhibits no tenderness.  Abdominal: Soft. Bowel sounds are normal. He exhibits no distension and no mass. There is no tenderness. There is no rebound and no guarding.  Musculoskeletal: Normal range of motion. He exhibits no edema.  Neurological: He is alert.  Lethargic and confused  Skin: Skin is warm. No rash noted. No erythema.      LABORATORY PANEL:   CBC  Recent Labs Lab 07/25/15 0610  WBC 17.3*  HGB 10.3*  HCT 31.5*  PLT 392   ------------------------------------------------------------------------------------------------------------------  Chemistries   Recent Labs Lab 07/25/15 0610  NA 150*  K 4.4  CL 119*   CO2 21*  GLUCOSE 196*  BUN 42*  CREATININE 0.98  CALCIUM 8.5*  AST 98*  ALT 701*  ALKPHOS 187*  BILITOT 2.5*   ------------------------------------------------------------------------------------------------------------------  Cardiac Enzymes No results for input(s): TROPONINI in the last 168 hours. ------------------------------------------------------------------------------------------------------------------  RADIOLOGY:  Dg Chest 2 View  07/23/2015  CLINICAL DATA:  Confusion and cough EXAM: CHEST  2 VIEW COMPARISON:  July 15, 2015 FINDINGS: There is persistent airspace consolidation consistent with pneumonia in left lower lobe laterally and posteriorly. There is a small left effusion. Right lung is clear. Heart is enlarged with pulmonary vascularity within normal limits. Pacemaker leads are attached to the right atrium, right ventricle, and left ventricle. No adenopathy. Patient is status post coronary artery bypass grafting. There is degenerative change in thoracic spine. IMPRESSION: Left lower lobe pneumonia with small left effusion. Right lung clear. Stable cardiomegaly. Followup PA and lateral chest radiographs recommended in 3-4 weeks following trial of antibiotic therapy to ensure resolution and exclude underlying malignancy. Electronically Signed   By: Bretta Bang III M.D.   On: 07/23/2015 13:42   Ct Head Wo Contrast  07/23/2015  CLINICAL DATA:  Altered mental status.  Confusion. EXAM: CT HEAD WITHOUT CONTRAST TECHNIQUE: Contiguous axial images were obtained from the base of the skull through the vertex without intravenous contrast. COMPARISON:  06/13/2015. FINDINGS: Diffusely enlarged ventricles and subarachnoid spaces. Patchy white matter low density in both cerebral hemispheres. Stable old right external capsule and right cerebellar hemisphere lacunar infarcts. Stable bilateral caudate head lacunar infarcts. No intracranial hemorrhage, mass lesion or CT  evidence of acute  infarction. Unremarkable bones. Left secretions in the sphenoid sinus bilaterally with residual mucosal thickening and retention cyst on the right and minimal residual mucosal thickening on the left. IMPRESSION: 1. No acute abnormality. 2. Stable atrophy, chronic small vessel white matter ischemic changes and old lacunar infarcts. 3. Chronic sphenoid sinusitis with mild improvement. Electronically Signed   By: Beckie Salts M.D.   On: 07/23/2015 15:01   US Abdomen Limited Ruq  07/23/2015  CLINICAL DATA:  Elevated liver function tests. EXAM: US ABDOMEN LIMITED - RIGHT UPPER QUADRANT COMPARISON:  None. FINDINGS: Gallbladder: 7 mm gallstone in the gallbladder. No gallbladder wall thickening or pericholecystic fluid. The patient was not focally tender over the gallbladder. Common bile duct: Diameter: 2.5 mm Liver: Mildly diffusely echogenic.  No masses or biliary ductal dilatation. IMPRESSION: 1. Cholelithiasis. 2. Mildly echogenic liver, most likely due to steatosis. Electronically Signed   By: Beckie Salts M.D.   On: 07/23/2015 17:50     ASSESSMENT AND PLAN:   82 year old male with a history of hypertension, diabetes and atrial fibrillation who presented with an INR of 7 and found to have pneumonia.  1. Left lower lobe pneumonia with worsening leukocytosis: Patient was being treated for community-acquired pneumonia on Levaquin. He needs broader coverage and therefore is on cefepime. White blood cell count has improved.  Follow up on sputum culture.   2. Supratherapeutic INR: Patient's INR is decreasing. He did receive 1 dose of vitamin K. Repeat INR tomorrow.  3. Dysphagia: As per speech consultation, patient is at moderate risk for aspiration. Patient is currently nothing by mouth with aspiration precautions.  4. Metabolic encephalopathy: This is due to a combination of pneumonia as well as recent hospitalizations with delirium. patient now has hypernatremia which is also continuing to metabolic  encephalopathy. Continue to orient the patient. Lites should be on during the day and off at night. This is discussed with family at bedside.  CAT scan of the head on admission shows no acute intracranial hemorrhage.   5. Insulin-dependent diabetes: Continue sliding scale insulin. Careful monitoring of blood sugars as I have started D5 for hypernatremia.  6. Chronic diastolic heart failure: No signs of fluid overload.  7. Paroxysmal atrial fibrillation: Heart rate is controlled. Continue monitoring INR. Coumadin is on hold due to supratherapeutic INR. 8. Numerous wounds on heels and stage II pressure ulcer on buttocks present on admission: Wound care consult appreciated.   Cleanse right heel with NS and pat gently dry. Apply Santyl ointment to wound bed. Cover with NS moist gauze (to wound bed only). Cover with dry 4x4 gauze and kerlix. Change daily. Prevalon boot while in bed.   Cleanse sacrum/coccyx with soap and water and pat gently dry. Apply barrier cream each shift and PRN incontinence. No disposable briefs or underpads under patient. Dermatherapy wicking linen under patient only. Turn and reposition to offload pressure.  9. Elevated LFT:  This is likely due to acute illness with ischemia and hypoxia versus drug-induced. AST/ALT is decreasing but total bilirubin is higher. Appreciate GI consultation. Right upper quadrant ultrasound shows cholelithiasis. Avoid hepatotoxic agents. If LFT or INR continues to rise and will need transfer to tertiary center.   10. Essential hypertension: Continue lisinopril   CODE STATUS: full TOTAL TIME TAKING CARE OF THIS PATIENT: 30 minutes.    Goals of care will need to be discussed and will place palliative care consul for Monday.   POSSIBLE D/C 3-5 days, DEPENDING ON CLINICAL CONDITION.  Donshay Lupinski M.D on 07/25/2015 at 10:16 AM  Between 7am to 6pm - Pager - [phone] After 6pm go to [email or web address] - password [email or web address]  Fabio Neighbors  Hospitalists  Office  [phone]  CC: Primary care physician; Sula Rumple, MD  Note: This dictation was prepared with Dragon dictation along with smaller phrase technology. Any transcriptional errors that result from this process are unintentional.

## 2015-07-25 NOTE — Progress Notes (Signed)
  Chart reviewed, Nsg consulted, Pt visited. Pt remains mostly unresponsive with occasional restlesness. Per wife, resting better today but not a awake. Pt is not able to wake enpugh for any PO's. PO meds have been held today. Rec NPO except meds when alert enough to take. Diet discontinued. Wife agreed. Will f/u Monday in hopes of overall improvement for a diet.

## 2015-07-25 NOTE — Progress Notes (Signed)
PT Cancellation Note  Patient Details Name: Riley Sanchez. MRN: [id number] DOB: Feb 04, 1930   Cancelled Treatment:    Reason Eval/Treat Not Completed: Medical issues which prohibited therapy.  Pt has 4.91 INR and will check back tomorrow.   Ivar Drape 07/25/2015, 12:39 PM   Samul Dada, PT MS Acute Rehab Dept. Number: [facility] [id number] and [facility] [phone]

## 2015-07-25 NOTE — Consult Note (Signed)
  GI Inpatient Follow-up Note  Patient Identification: Lupe Bonner. is a 82 y.o. male with LFT elevation.  Subjective: Lethargic. Not able to provide any hx. Please see Herschell Dimes' notes from yesterday.  Scheduled Inpatient Medications:  . aspirin EC  81 mg Oral Daily  . ceFEPime (MAXIPIME) IV  2 g Intravenous 3 times per day  . collagenase   Topical Daily  . docusate sodium  100 mg Oral BID  . feeding supplement (ENSURE ENLIVE)  237 mL Oral TID WC  . ferrous sulfate  325 mg Oral Q breakfast  . insulin aspart  0-5 Units Subcutaneous QHS  . insulin aspart  0-9 Units Subcutaneous TID WC  . lisinopril  10 mg Oral Daily  . pravastatin  10 mg Oral q1800  . predniSONE  10 mg Oral Daily  . sodium chloride  3 mL Intravenous Q12H  . tiotropium  18 mcg Inhalation Daily    Continuous Inpatient Infusions:     PRN Inpatient Medications:  sodium chloride, bisacodyl, ipratropium-albuterol, ipratropium-albuterol, ondansetron **OR** ondansetron (ZOFRAN) IV, polyethylene glycol, sodium chloride  Review of Systems: Constitutional: Weight is stable.  Eyes: No changes in vision. ENT: No oral lesions, sore throat.  GI: see HPI.  Heme/Lymph: No easy bruising.  CV: No chest pain.  GU: No hematuria.  Integumentary: No rashes.  Neuro: No headaches.  Psych: No depression/anxiety.  Endocrine: No heat/cold intolerance.  Allergic/Immunologic: No urticaria.  Resp: No cough, SOB.  Musculoskeletal: No joint swelling.    Physical Examination: BP 143/65 mmHg  Pulse 81  Temp(Src) 99.4 F (37.4 C) (Axillary)  Resp 21  Ht 6' (1.829 m)  Wt 70.444 kg (155 lb 4.8 oz)  BMI 21.06 kg/m2  SpO2 96% Gen: NAD, Lethargic. Jaundiced. HEENT: PEERLA, EOMI, Neck: supple, no JVD or thyromegaly Chest: CTA bilaterally, no wheezes, crackles, or other adventitious sounds CV: RRR, no m/g/c/r Abd: soft, NT, ND, +BS in all four quadrants; no HSM, guarding, ridigity, or rebound tenderness Ext: no edema, well  perfused with 2+ pulses, Skin: no rash or lesions noted Lymph: no LAD  Data: Lab Results  Component Value Date   WBC 17.3* 07/25/2015   HGB 10.3* 07/25/2015   HCT 31.5* 07/25/2015   MCV 88.0 07/25/2015   PLT 392 07/25/2015    Recent Labs Lab 07/23/15 1256 07/24/15 0832 07/25/15 0610  HGB 9.6* 9.9* 10.3*   Lab Results  Component Value Date   NA 150* 07/25/2015   K 4.4 07/25/2015   CL 119* 07/25/2015   CO2 21* 07/25/2015   BUN 42* 07/25/2015   CREATININE 0.98 07/25/2015   Lab Results  Component Value Date   ALT 701* 07/25/2015   AST 98* 07/25/2015   ALKPHOS 187* 07/25/2015   BILITOT 2.5* 07/25/2015    Recent Labs Lab 07/23/15 1256  07/25/15 0610  APTT 43*  --   --   INR 5.24*  < > 4.91*  < > = values in this interval not displayed. Assessment/Plan: Mr. Mcmichael is a 82 y.o. male with LFT elevation. AST/ALT coming down though T. Bili higher. Likely drug- induced changes vs ischemia/hypoxia related.  Recommendations: Continue to follow INR/LFT. Await tylenol/ hepatitis results. Avoid any hepatotoxic agents. Make sure BP stable. If LFT/INR continues to rise, then will need transfer to a tertiary center. Thanks. Please call with questions or concerns.  Adrienne Trombetta, Ezzard Standing, MD

## 2015-07-26 ENCOUNTER — Inpatient Hospital Stay: Payer: Medicare Other

## 2015-07-26 LAB — CBC
HEMATOCRIT: 33.8 % — AB (ref 40.0–52.0)
HEMOGLOBIN: 10.8 g/dL — AB (ref 13.0–18.0)
MCH: 28.5 pg (ref 26.0–34.0)
MCHC: 31.9 g/dL — AB (ref 32.0–36.0)
MCV: 89.5 fL (ref 80.0–100.0)
Platelets: 350 10*3/uL (ref 150–440)
RBC: 3.78 MIL/uL — ABNORMAL LOW (ref 4.40–5.90)
RDW: 17.4 % — ABNORMAL HIGH (ref 11.5–14.5)
WBC: 17.4 10*3/uL — ABNORMAL HIGH (ref 3.8–10.6)

## 2015-07-26 LAB — PROTIME-INR
INR: 2.79
Prothrombin Time: 29 seconds — ABNORMAL HIGH (ref 11.4–15.0)

## 2015-07-26 LAB — COMPREHENSIVE METABOLIC PANEL
ALBUMIN: 2.6 g/dL — AB (ref 3.5–5.0)
ALK PHOS: 162 U/L — AB (ref 38–126)
ALT: 463 U/L — ABNORMAL HIGH (ref 17–63)
ANION GAP: 6 (ref 5–15)
AST: 43 U/L — AB (ref 15–41)
BUN: 36 mg/dL — AB (ref 6–20)
CALCIUM: 8.3 mg/dL — AB (ref 8.9–10.3)
CO2: 25 mmol/L (ref 22–32)
Chloride: 118 mmol/L — ABNORMAL HIGH (ref 101–111)
Creatinine, Ser: 1.01 mg/dL (ref 0.61–1.24)
GFR calc Af Amer: >60 mL/min (ref >60)
GFR calc non Af Amer: >60 mL/min (ref >60)
GLUCOSE: 286 mg/dL — AB (ref 65–99)
Potassium: 3.9 mmol/L (ref 3.5–5.1)
SODIUM: 149 mmol/L — AB (ref 135–145)
Total Bilirubin: 2.2 mg/dL — ABNORMAL HIGH (ref 0.3–1.2)
Total Protein: 5.6 g/dL — ABNORMAL LOW (ref 6.5–8.1)

## 2015-07-26 LAB — GLUCOSE, CAPILLARY
GLUCOSE-CAPILLARY: 270 mg/dL — AB (ref 65–99)
GLUCOSE-CAPILLARY: 171 mg/dL — AB (ref 65–99)
GLUCOSE-CAPILLARY: 188 mg/dL — AB (ref 65–99)
Glucose-Capillary: 193 mg/dL — ABNORMAL HIGH (ref 65–99)

## 2015-07-26 LAB — HEPATITIS PANEL, ACUTE
HCV Ab: <0.1 s/co ratio (ref 0.0–0.9)
HEP B C IGM: NEGATIVE
Hep A IgM: UNDETERMINED
Hepatitis B Surface Ag: NEGATIVE

## 2015-07-26 NOTE — Progress Notes (Signed)
Received pt as a transfer from 2A to Rm 230. Pt AOx4. VSS. No signs of acute distress. IV access intact and infusing abx. Medications administered (See MAR).   Education provided on call bell, bed alarm, telephone and IV/IV Pole/IV Alarms. 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 White Board was completed and updated PRN.   Dietary specification confirmed.   Pt resting comfortably and quietly w/bed in low and locked position and call bell and telephone within reach. Will continue to monitor.

## 2015-07-26 NOTE — Plan of Care (Signed)
Problem: Nutrition: Goal: Adequate nutrition will be maintained Outcome: Not Met (add Reason) Awaiting further evaluation from Speech Therapy.

## 2015-07-26 NOTE — Progress Notes (Addendum)
[facility] - [place] at [facility]   PATIENT NAME: Riley Sanchez    MR#:  [id number]  DATE OF BIRTH:  [birth year]  SUBJECTIVE:   Family bedside. Patient remains with his eyes closed and lethargic.  REVIEW OF SYSTEMS:    Review of Systems  Unable to perform ROS: medical condition    Tolerating Diet patient is nothing by mouth as per speech consultation with strict aspiration cautions     DRUG ALLERGIES:   Allergies  Allergen Reactions  . Bactrim [Sulfamethoxazole-Trimethoprim] Other (See Comments)    Reaction:  Unknown     VITALS:  Blood pressure 150/74, pulse 84, temperature 98.6 F (37 C), temperature source Axillary, resp. rate 20, height 6' (1.829 m), weight 69.854 kg (154 lb), SpO2 99 %.  PHYSICAL EXAMINATION:   Physical Exam  Constitutional: He is well-developed, well-nourished, and in no distress. No distress.  HENT:  Head: Normocephalic.  Eyes: No scleral icterus.  Neck: Normal range of motion. Neck supple. No JVD present. No tracheal deviation present.  Cardiovascular: Normal rate, regular rhythm and normal heart sounds.  Exam reveals no gallop and no friction rub.   No murmur heard. Pulmonary/Chest: Effort normal. No respiratory distress. He has no wheezes. He has no rales. He exhibits no tenderness.  Bilateral rhonchi  Abdominal: Soft. Bowel sounds are normal. He exhibits no distension and no mass. There is no tenderness. There is no rebound and no guarding.  Musculoskeletal: Normal range of motion. He exhibits no edema.  Neurological: He is alert.  Lethargic and confused  Skin: Skin is warm. No rash noted. No erythema.      LABORATORY PANEL:   CBC  Recent Labs Lab 07/26/15 0646  WBC 17.4*  HGB 10.8*  HCT 33.8*  PLT 350   ------------------------------------------------------------------------------------------------------------------  Chemistries   Recent Labs Lab 07/26/15 0646  NA 149*  K 3.9  CL 118*   CO2 25  GLUCOSE 286*  BUN 36*  CREATININE 1.01  CALCIUM 8.3*  AST 43*  ALT 463*  ALKPHOS 162*  BILITOT 2.2*   ------------------------------------------------------------------------------------------------------------------  Cardiac Enzymes No results for input(s): TROPONINI in the last 168 hours. ------------------------------------------------------------------------------------------------------------------  RADIOLOGY:  No results found.   ASSESSMENT AND PLAN:   82 year old male with a history of hypertension, diabetes and atrial fibrillation who presented with an INR of 7 and found to have pneumonia.  1. Left lower lobe pneumonia: Patient was being treated for community-acquired pneumonia on Levaquin. He needed broader coverage and therefore is on cefepime. White blood cell count has improved.  Blood cultures are negative to date.  2. Supratherapeutic INR: Patient's INR is decreasing. He did receive 1 dose of vitamin K. Repeat INR tomorrow. INR 2.79 this morning. 3. Dysphagia: As per speech consultation, patient is at moderate risk for aspiration. Patient is currently nothing by mouth with aspiration precautions.  4. Metabolic encephalopathy: This is due to a combination of pneumonia as well as recent hospitalizations with delirium. patient now has hypernatremia which is also continuing to metabolic encephalopathy. Continue to orient the patient. Lights should be on during the day and off at night. This is discussed with family at bedside.  CAT scan of the head on admission shows no acute intracranial hemorrhage. Due to ongoing lethargy I will reorder CT scan to evaluate  encephalopathy.  5. Insulin-dependent diabetes: Continue sliding scale insulin. Careful monitoring of blood sugars as I have started D5 for hypernatremia.  6. Chronic diastolic heart failure: No signs of fluid overload.  7. Paroxysmal atrial fibrillation: Heart rate is controlled. Continue monitoring  INR. Coumadin is on hold due to supratherapeutic INR. 8. Numerous wounds on heels and stage II pressure ulcer on buttocks present on admission: Wound care consult appreciated.   Cleanse right heel with NS and pat gently dry. Apply Santyl ointment to wound bed. Cover with NS moist gauze (to wound bed only). Cover with dry 4x4 gauze and kerlix. Change daily. Prevalon boot while in bed.   Cleanse sacrum/coccyx with soap and water and pat gently dry. Apply barrier cream each shift and PRN incontinence. No disposable briefs or underpads under patient. Dermatherapy wicking linen under patient only. Turn and reposition to offload pressure.  9. Elevated LFT:  This is likely due to acute illness with ischemia and hypoxia. LFTs are improving. Right upper quadrant ultrasound shows cholelithiasis. Avoid hepatotoxic agents.10. Essential hypertension: Continue lisinopril   10. Hypernatremia: I will increase D5. Sodium is at 149 which it was that yesterday as well. Appreciated GI consultation CODE STATUS: full Critical care TOTAL TIME TAKING CARE OF THIS PATIENT: 35 minutes.   Patient is critically ill and high risk of cardiopulmonary arrest. I discussed this with the family member at bedside. Palliative care consult for tomorrow. Goals of care will need to be discussed  and CODE STATUS.   POSSIBLE D/C 3-5 days, DEPENDING ON CLINICAL CONDITION.   Rashika Bettes M.D on 07/26/2015 at 9:56 AM  Between 7am to 6pm - Pager - [phone] After 6pm go to [email or web address] - password [email or web address]  [facility]  Office  [phone]  CC: Primary care physician; Sula Rumple, MD  Note: This dictation was prepared with Dragon dictation along with smaller phrase technology. Any transcriptional errors that result from this process are unintentional.

## 2015-07-26 NOTE — Progress Notes (Signed)
Home medications counted and witnessed by Matt Holmes, NA.    Sent to pharmacy.

## 2015-07-26 NOTE — Consult Note (Signed)
  GI Inpatient Follow-up Note  Patient Identification: Riley Sanchez. is a 82 y.o. male  Subjective: Pt more alert and agitated today. No longer lethargic like yesterday. Still nonverbal. LFT trending down. Tylenol level wnl. Hepatitis panel neg.  Scheduled Inpatient Medications:  . aspirin EC  81 mg Oral Daily  . ceFEPime (MAXIPIME) IV  2 g Intravenous 3 times per day  . collagenase   Topical Daily  . docusate sodium  100 mg Oral BID  . feeding supplement (ENSURE ENLIVE)  237 mL Oral TID WC  . ferrous sulfate  325 mg Oral Q breakfast  . insulin aspart  0-5 Units Subcutaneous QHS  . insulin aspart  0-9 Units Subcutaneous TID WC  . lisinopril  10 mg Oral Daily  . pravastatin  10 mg Oral q1800  . predniSONE  10 mg Oral Daily  . sodium chloride  3 mL Intravenous Q12H  . tiotropium  18 mcg Inhalation Daily    Continuous Inpatient Infusions:   . dextrose 75 mL/hr at 07/25/15 2327    PRN Inpatient Medications:  sodium chloride, bisacodyl, ipratropium-albuterol, ondansetron **OR** ondansetron (ZOFRAN) IV, polyethylene glycol, sodium chloride  Review of Systems: Constitutional: Weight is stable.  Eyes: No changes in vision. ENT: No oral lesions, sore throat.  GI: see HPI.  Heme/Lymph: No easy bruising.  CV: No chest pain.  GU: No hematuria.  Integumentary: No rashes.  Neuro: No headaches.  Psych: No depression/anxiety.  Endocrine: No heat/cold intolerance.  Allergic/Immunologic: No urticaria.  Resp: No cough, SOB.  Musculoskeletal: No joint swelling.    Physical Examination: BP 150/74 mmHg  Pulse 84  Temp(Src) 98.6 F (37 C) (Axillary)  Resp 20  Ht 6' (1.829 m)  Wt 69.854 kg (154 lb)  BMI 20.88 kg/m2  SpO2 99% Gen: NAD, alert and oriented x 4 HEENT: PEERLA, EOMI, Neck: supple, no JVD or thyromegaly Chest: CTA bilaterally, no wheezes, crackles, or other adventitious sounds CV: RRR, no m/g/c/r Abd: soft, NT, ND, +BS in all four quadrants; no HSM, guarding,  ridigity, or rebound tenderness Ext: no edema, well perfused with 2+ pulses, Skin: no rash or lesions noted Lymph: no LAD  Data: Lab Results  Component Value Date   WBC 17.4* 07/26/2015   HGB 10.8* 07/26/2015   HCT 33.8* 07/26/2015   MCV 89.5 07/26/2015   PLT 350 07/26/2015    Recent Labs Lab 07/24/15 0832 07/25/15 0610 07/26/15 0646  HGB 9.9* 10.3* 10.8*   Lab Results  Component Value Date   NA 149* 07/26/2015   K 3.9 07/26/2015   CL 118* 07/26/2015   CO2 25 07/26/2015   BUN 36* 07/26/2015   CREATININE 1.01 07/26/2015   Lab Results  Component Value Date   ALT 463* 07/26/2015   AST 43* 07/26/2015   ALKPHOS 162* 07/26/2015   BILITOT 2.2* 07/26/2015    Recent Labs Lab 07/23/15 1256  07/26/15 0646  APTT 43*  --   --   INR 5.24*  < > 2.79  < > = values in this interval not displayed. Assessment/Plan: Mr. Heslin is a 82 y.o. male with LFT elevation, likely medication-induced or hypoxia/ischemia. Anyway, improving.  Recommendations: Continue to moniter LFT. If continues to improve, no further GI intervention needed. Please call with questions or concerns.  Rayden Dock, Ezzard Standing, MD

## 2015-07-27 ENCOUNTER — Inpatient Hospital Stay: Payer: Medicare Other

## 2015-07-27 DIAGNOSIS — G9341 Metabolic encephalopathy: Secondary | ICD-10-CM

## 2015-07-27 DIAGNOSIS — J189 Pneumonia, unspecified organism: Principal | ICD-10-CM

## 2015-07-27 DIAGNOSIS — E43 Unspecified severe protein-calorie malnutrition: Secondary | ICD-10-CM

## 2015-07-27 DIAGNOSIS — I5042 Chronic combined systolic (congestive) and diastolic (congestive) heart failure: Secondary | ICD-10-CM

## 2015-07-27 DIAGNOSIS — A419 Sepsis, unspecified organism: Secondary | ICD-10-CM

## 2015-07-27 DIAGNOSIS — Z515 Encounter for palliative care: Secondary | ICD-10-CM

## 2015-07-27 DIAGNOSIS — R400 Somnolence: Secondary | ICD-10-CM

## 2015-07-27 DIAGNOSIS — F05 Delirium due to known physiological condition: Secondary | ICD-10-CM

## 2015-07-27 DIAGNOSIS — Z794 Long term (current) use of insulin: Secondary | ICD-10-CM

## 2015-07-27 DIAGNOSIS — F0390 Unspecified dementia without behavioral disturbance: Secondary | ICD-10-CM

## 2015-07-27 DIAGNOSIS — E1122 Type 2 diabetes mellitus with diabetic chronic kidney disease: Secondary | ICD-10-CM

## 2015-07-27 DIAGNOSIS — N189 Chronic kidney disease, unspecified: Secondary | ICD-10-CM

## 2015-07-27 DIAGNOSIS — N179 Acute kidney failure, unspecified: Secondary | ICD-10-CM

## 2015-07-27 DIAGNOSIS — I129 Hypertensive chronic kidney disease with stage 1 through stage 4 chronic kidney disease, or unspecified chronic kidney disease: Secondary | ICD-10-CM

## 2015-07-27 DIAGNOSIS — R1310 Dysphagia, unspecified: Secondary | ICD-10-CM

## 2015-07-27 DIAGNOSIS — R410 Disorientation, unspecified: Secondary | ICD-10-CM

## 2015-07-27 LAB — GLUCOSE, CAPILLARY
GLUCOSE-CAPILLARY: 179 mg/dL — AB (ref 65–99)
GLUCOSE-CAPILLARY: 115 mg/dL — AB (ref 65–99)
Glucose-Capillary: 208 mg/dL — ABNORMAL HIGH (ref 65–99)
Glucose-Capillary: 113 mg/dL — ABNORMAL HIGH (ref 65–99)

## 2015-07-27 LAB — BASIC METABOLIC PANEL
Anion gap: 3 — ABNORMAL LOW (ref 5–15)
BUN: 23 mg/dL — ABNORMAL HIGH (ref 6–20)
CHLORIDE: 116 mmol/L — AB (ref 101–111)
CO2: 26 mmol/L (ref 22–32)
CREATININE: 0.77 mg/dL (ref 0.61–1.24)
Calcium: 7.7 mg/dL — ABNORMAL LOW (ref 8.9–10.3)
GFR calc non Af Amer: >60 mL/min (ref >60)
Glucose, Bld: 237 mg/dL — ABNORMAL HIGH (ref 65–99)
POTASSIUM: 3.3 mmol/L — AB (ref 3.5–5.1)
Sodium: 145 mmol/L (ref 135–145)

## 2015-07-27 LAB — CBC
HEMATOCRIT: 29.3 % — AB (ref 40.0–52.0)
Hemoglobin: 9.5 g/dL — ABNORMAL LOW (ref 13.0–18.0)
MCH: 29.2 pg (ref 26.0–34.0)
MCHC: 32.3 g/dL (ref 32.0–36.0)
MCV: 90.3 fL (ref 80.0–100.0)
PLATELETS: 253 10*3/uL (ref 150–440)
RBC: 3.24 MIL/uL — AB (ref 4.40–5.90)
RDW: 17.5 % — ABNORMAL HIGH (ref 11.5–14.5)
WBC: 10.7 10*3/uL — AB (ref 3.8–10.6)

## 2015-07-27 LAB — AMMONIA: Ammonia: 28 umol/L (ref 9–35)

## 2015-07-27 LAB — PROTIME-INR
INR: 2.12
Prothrombin Time: 23.6 seconds — ABNORMAL HIGH (ref 11.4–15.0)

## 2015-07-27 LAB — ALBUMIN: ALBUMIN: 2.3 g/dL — AB (ref 3.5–5.0)

## 2015-07-27 LAB — VITAMIN B12: VITAMIN B 12: 1117 pg/mL — AB (ref 180–914)

## 2015-07-27 MED ORDER — POTASSIUM CHLORIDE 20 MEQ PO PACK: 20.0000 meq | PACK | Freq: Once | ORAL | Status: DC

## 2015-07-27 MED ORDER — WARFARIN SODIUM 1 MG PO TABS: 5.0000 mg | ORAL_TABLET | Freq: Once | ORAL | Status: DC

## 2015-07-27 MED ORDER — MOMETASONE FURO-FORMOTEROL FUM 200-5 MCG/ACT IN AERO
2.0000 | INHALATION_SPRAY | Freq: Two times a day (BID) | RESPIRATORY_TRACT | Status: DC
  Filled 2015-07-27: qty 8.8

## 2015-07-27 MED ORDER — POTASSIUM CHLORIDE 10 MEQ/100ML IV SOLN
10.0000 meq | INTRAVENOUS | Status: AC
  Administered 2015-07-27 (×4): 10 meq via INTRAVENOUS
  Filled 2015-07-27 (×4): qty 100

## 2015-07-27 MED ORDER — SODIUM CHLORIDE 0.9 % IV SOLN
INTRAVENOUS | Status: DC
  Administered 2015-07-27 – 2015-07-28 (×2): via INTRAVENOUS

## 2015-07-27 MED ORDER — WARFARIN - PHARMACIST DOSING INPATIENT: Freq: Every day | Status: DC

## 2015-07-27 NOTE — Consult Note (Signed)
  Pt is NPO due to aspiration risk. According to wife, pt would not eat much at home but would chew food if given without coughing or choking. Would have official speech evaluation with modified barium swallow. If pt is truly at risk for aspiration, then alternative nutritional support may be necessary, incl PEG placement. Did discuss with wife regarding this. Recheck LFT tomorrow to make sure LFT is improving. IF family needs PEG, please let me know. INR has to be less than 1.5, before we can schedule PEG. Thanks.

## 2015-07-27 NOTE — Progress Notes (Signed)
Inpatient Diabetes Program Recommendations  AACE/ADA: New Consensus Statement on Inpatient Glycemic Control (2015)  Target Ranges:  Prepandial:   less than 140 mg/dL      Peak postprandial:   less than 180 mg/dL (1-2 hours)      Critically ill patients:  140 - 180 mg/dL  Results for JAHMAR, MCKELVY (MRN [id number]) as of 07/27/2015 09:41  Ref. Range 07/26/2015 07:36 07/26/2015 11:46 07/26/2015 16:26 07/26/2015 21:19 07/27/2015 07:41  Glucose-Capillary Latest Ref Range: 65-99 mg/dL 893 (H) 810 (H) 175 (H) 188 (H) 208 (H)   Review of Glycemic Control  Diabetes history: DM2 Outpatient Diabetes medications: Novoog 0-9 units TID with meals Current orders for Inpatient glycemic control: Novolog 0-9 units ACHS  Inpatient Diabetes Program Recommendations: Correction (SSI): Please consider increasing Novolog correction to moderate scale.  Thanks, Orlando Penner, RN, MSN, CDE Diabetes Coordinator Inpatient Diabetes Program [phone] (Team Pager from 8am to 5pm) [phone] (AP office) [phone] [facility] office) [phone] [facility] office)

## 2015-07-27 NOTE — Care Management Important Message (Signed)
Important Message  Patient Details  Name: Riley Sanchez. MRN: [id number] Date of Birth: 01-01-1930   Medicare Important Message Given:  Yes    Chapman Fitch, RN 07/27/2015, 2:10 PM

## 2015-07-27 NOTE — Progress Notes (Signed)
[facility] - [place] at [facility]   PATIENT NAME: Coy Rochford    MR#:  [id number]  DATE OF BIRTH:  12/24/1929  SUBJECTIVE:   Family at bedside. Family reports that he had his eyes open for a significant amount of time this morning. On my examination patient only opens his eyes briefly but does not follow any commands.  REVIEW OF SYSTEMS:    Review of Systems  Unable to perform ROS: medical condition    Tolerating Diet patient is nothing by mouth as per speech consultation with strict aspiration cautions     DRUG ALLERGIES:   Allergies  Allergen Reactions  . Bactrim [Sulfamethoxazole-Trimethoprim] Other (See Comments)    Reaction:  Unknown     VITALS:  Blood pressure 134/67, pulse 79, temperature 98.1 F (36.7 C), temperature source Oral, resp. rate 20, height 6' (1.829 m), weight 70.534 kg (155 lb 8 oz), SpO2 98 %.  PHYSICAL EXAMINATION:   Physical Exam  Constitutional: He is well-developed, well-nourished, and in no distress. No distress.  HENT:  Head: Normocephalic.  Eyes: No scleral icterus.  Neck: Normal range of motion. Neck supple. No JVD present. No tracheal deviation present.  Cardiovascular: Normal rate, regular rhythm and normal heart sounds.  Exam reveals no gallop and no friction rub.   No murmur heard. Pulmonary/Chest: Effort normal. No respiratory distress. He has no wheezes. He has no rales. He exhibits no tenderness.  Bilateral rhonchi  Abdominal: Soft. Bowel sounds are normal. He exhibits no distension and no mass. There is no tenderness. There is no rebound and no guarding.  Musculoskeletal: Normal range of motion. He exhibits no edema.  Neurological: He is alert.  Lethargic  Moves all extremities but does not follow commands  Skin: Skin is warm. No rash noted. No erythema.      LABORATORY PANEL:   CBC  Recent Labs Lab 07/27/15 0540  WBC 10.7*  HGB 9.5*  HCT 29.3*  PLT 253    ------------------------------------------------------------------------------------------------------------------  Chemistries   Recent Labs Lab 07/26/15 0646 07/27/15 0540  NA 149* 145  K 3.9 3.3*  CL 118* 116*  CO2 25 26  GLUCOSE 286* 237*  BUN 36* 23*  CREATININE 1.01 0.77  CALCIUM 8.3* 7.7*  AST 43*  --   ALT 463*  --   ALKPHOS 162*  --   BILITOT 2.2*  --    ------------------------------------------------------------------------------------------------------------------  Cardiac Enzymes No results for input(s): TROPONINI in the last 168 hours. ------------------------------------------------------------------------------------------------------------------  RADIOLOGY:  Dg Chest 1 View  07/26/2015  CLINICAL DATA:  Cough, pneumonia EXAM: CHEST 1 VIEW COMPARISON:  07/23/2015 FINDINGS: Cardiomegaly again noted. Status post CABG. Three leads cardiac pacemaker is unchanged in position. Again noted hazy infiltrate/pneumonia in left lower lobe. There is new infiltrate/pneumonia in right upper lobe perihilar. IMPRESSION: Again noted hazy infiltrate/pneumonia in left lower lobe. There is new infiltrate/pneumonia in right upper lobe perihilar. Electronically Signed   By: Natasha Mead M.D.   On: 07/26/2015 12:28   Ct Head Wo Contrast  07/26/2015  CLINICAL DATA:  Altered mental status EXAM: CT HEAD WITHOUT CONTRAST TECHNIQUE: Contiguous axial images were obtained from the base of the skull through the vertex without intravenous contrast. COMPARISON:  07/23/2015 FINDINGS: Severe diffuse atrophy and low attenuation in the deep white matter. Proportional dilatation of the ventricles. Chronic tiny basal ganglia lacunar infarcts. Probable tiny lacunar infarct likely subacute to chronic right brainstem. No vascular territory infarct. No evidence of mass. No hemorrhage or extra-axial fluid.  Inflammatory change in the sphenoid sinuses similar to prior study. IMPRESSION: No acute findings.  Age-related involutional change and sinus inflammation stable. Electronically Signed   By: Esperanza Heir M.D.   On: 07/26/2015 14:32     ASSESSMENT AND PLAN:   82 year old male with a history of hypertension, diabetes and atrial fibrillation who presented with an INR of 7 and found to have pneumonia.  1. Left lower lobe pneumonia: Patient was being treated for community-acquired pneumonia on Levaquin. He needed broader coverage and therefore is on cefepime. White blood cell count has improved.  Blood cultures are negative to date. New infiltrate on chest x-rays likely due to aspiration.  2. Supratherapeutic INR: INR pending this morning. It has been decreasing.  3. Dysphagia: As per speech consultation, patient is at moderate risk for aspiration. Patient is currently nothing by mouth with aspiration precautions.  4. Metabolic encephalopathy: This is due to a combination of pneumonia as well as recent hospitalizations with delirium. patient now has hypernatremia which is also continuing to metabolic encephalopathy. Continue to orient the patient. Lights should be on during the day and off at night.  CAT scan of the head on admission shows no acute intracranial hemorrhage as well as repeat CT scan on January 22.. Patient continues to have lehthargy will order EEG and neurology consultation. Sodium level has improved to 145 on D5.  5. Insulin-dependent diabetes: Continue sliding scale insulin. Careful monitoring of blood sugars as I have started D5 for hypernatremia.  6. Chronic diastolic heart failure: No signs of fluid overload.  7. Paroxysmal atrial fibrillation: Heart rate is controlled. Continue monitoring INR. Coumadin is on hold due to supratherapeutic INR. 8. Numerous wounds on heels and stage II pressure ulcer on buttocks present on admission: Wound care consult appreciated.   Cleanse right heel with NS and pat gently dry. Apply Santyl ointment to wound bed. Cover with NS moist  gauze (to wound bed only). Cover with dry 4x4 gauze and kerlix. Change daily. Prevalon boot while in bed.   Cleanse sacrum/coccyx with soap and water and pat gently dry. Apply barrier cream each shift and PRN incontinence. No disposable briefs or underpads under patient. Dermatherapy wicking linen under patient only. Turn and reposition to offload pressure.   9. Elevated LFT:  This is likely due to acute illness with ischemia and hypoxia. LFTs are improving. Right upper quadrant ultrasound shows cholelithiasis. Avoid hepatotoxic agents.  10. Essential hypertension: Continue lisinopril   10. Hypernatremia: I will change fluids to normal saline.  CODE STATUS: full TOTAL TIME TAKING CARE OF THIS PATIENT: 35 minutes.   I discussed case with palliative care. Patient needs to have discussion of CODE STATUS and goals of care. I will order albumin as requested by Dr. Orvan Falconer.   POSSIBLE D/C 3-5 days, DEPENDING ON CLINICAL CONDITION.   Shauntea Lok M.D on 07/27/2015 at 10:41 AM  Between 7am to 6pm - Pager - [phone] After 6pm go to [email or web address] - password [email or web address]  [facility]  Office  [phone]  CC: Primary care physician; Sula Rumple, MD  Note: This dictation was prepared with Dragon dictation along with smaller phrase technology. Any transcriptional errors that result from this process are unintentional.
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
## 2015-07-27 NOTE — Progress Notes (Signed)
Speech Therapy Note: reviewed chart notes; consulted NSG re: status today. Pt remains confused and is not improving per NSG report. Neurology is consulted and seeing pt today. Rec. pt remain NPO while this confused as his risk for aspiration is high d/t the declined cognitive status. Currently, lungs clear per Neurology MD note; wbc slightly elevated; no elevated temp. He remains on 2 liter  O2 support. ST will f/u tomorrow w/ pt's status.

## 2015-07-27 NOTE — Consult Note (Signed)
Palliative Medicine Inpatient Consult Note   Name: Riley Sanchez. Date: 07/27/2015 MRN: [id number]  DOB: [birth year]  Referring Physician: Bettey Costa, MD  Palliative Care consult requested for this 82 y.o. male for goals of medical therapy in patient with sepsis, multiple medical problems, and altered mental status.  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 Since then, he has been going downhill, physically and cognitively.  This is his third hospitalization since November.  He was here in December for pneumonia with hemoptysis.  He was just hospitalized here last week and returns from the skilled facility where he resides with a cough and fever.  An Xray showed Left lower lobe infiltrate with a WBC of 20k.   He has other conditions as detailed below.  He had a dysphagia work up at the facility and was due to have another eval for swallowing at the facilty soon.  TODAY'S DISCUSSIONS AND DECISIONS:  I called and spoke with pt's son.  I had tried reaching the daughter-in-law, but left a message for her.  Later, I met briefly with son and daughter-in-law in person.  I had been advised to first talk to them as the patient's wife has been reported to be in some degree of denial over the seriousness of the patient's condition and prognosis.   I found that all family members are seeing the patient's condition and prognosis much, much more optimistically than the care team views the situation.  I spoke at length about the malnutrition and also spoke about his other conditions and possible dementia.  I addressed code status --several times.  I addressed a feeding tube vs comfort care --several times.  But, what I heard back was that the son has got a notary ready to come here so that he can get POA status (along with HCPOA status). The patient was moving on exam when I saw him, but he was not responsive.  But the son was hopeful that  he would wake up tomorrow and be completely cognitively intact and able to convey POA status as is his plan.    I have arranged a meeting with the son, his wife, and the pt's wife tomorrow at 9:30 am.  Unfortunately, the son asked me several times to not tell his mother some of the things I told him.  Those things were the bad prognosis that pt's severe malnutrition, pressure ulcers, recent frequent hospitalizations with serious illnesses, his infections, and also his altered level of consciousness and of course his inability to participate in eating.  He was appreciative of some of the information and I mentioned several times that it is always good to have hope.  But, I am unclear what I am to accomplish tomorrow given the constraints put on me about 'not being negative'.    There was no decision regarding code status or feeding tube by the end of this day.  I suspect that the pt is improving somewhat in terms of opening his eyes and focusing --as that has been reported.  But, he didn't get severely malnourished overnight and he was probably mildly demented prior to November's hip fracture.  His prognosis is very poor long term.  I suspect they will want aggressive interventions --which can be done once they recognize that he isn't going to wake up and meet all his calorie needs once he 'wakes up tomorrow'.      IMPRESSION: Sepsis ---  due to pneumonia ( technically HCAP since he came from a facilit --but aspiration is considered likely also) ---CXR shows Left Lower and now also a Right Upper infiltrate Metabolic Encephalopathy ---due to multiple illnesses and hospitalization with delerium ---may also be due to hypernatremia  ---Two head CTs do not show any acute events ---EEG is pending ---pt reportedly had his eyes open more than usual this am ---Neurology following and noted he still is not following any commands Dysphagia ---Per SLP --at moderate risk for aspiration and is therefore NPO  now Hypernatremia --improved DM-2 insulin controlled ---with hyperglycemia Paroxysmal A Fib Numerous pressure ulcers on heels and stage 2 pressure ulcer on buttocks  ---present on admission Elevated Liver Function Tests Chronic systolic and diastolic Heart Failure ---no signs of fluid overload ---Echo in Dec of 2016 showed EF of 40-45% Supratherapeutic INR  ---on coumadin for AFib GERD HTN Possible Dementia  ---reports of his cognition prior to November varies Acute on Chronic Renal Failure Anemia  ---likely of chronic disease Severe malnutrition ---albumin is 2.3 today  Fatty Liver Cholelithiasis Former smoker (quit 35 yrs ago)    REVIEW OF SYSTEMS:  Patient is not able to provide ROS due to confusion.  SPIRITUAL SUPPORT SYSTEM: Yes --family.  SOCIAL HISTORY:  reports that he quit smoking about 35 years ago. He does not have any smokeless tobacco history on file. He reports that he does not drink alcohol or use illicit drugs.   Married.   Wife is Riley Sanchez.  Son is Riley Sanchez.   Was at [facility].    LEGAL DOCUMENTS:  None Son has papers given to him by [place] for [facility] and Living Will. He also has a notary lined up to come here to get pt to sign a POA document.  CODE STATUS: Full code  PAST MEDICAL HISTORY: Past Medical History  Diagnosis Date  . Diabetes mellitus without complication ([place])   . Hypertension   . A-fib ([place])   . GERD (gastroesophageal reflux disease)   . CHF (congestive heart failure) ([place])   . Fracture, femur ([place])     PAST SURGICAL HISTORY:  Past Surgical History  Procedure Laterality Date  . Cardiac surgery      bypass in 89  . Cardiac defibrillator placement      replaced 1/16, medtronic  . Cardiac defibrillator placement      medtronic 1/16  . Hip pinning,cannulated Left 05/19/2015    Procedure: CANNULATED HIP PINNING;  Surgeon: Thornton Park, MD;  Location: [facility] ORS;  Service: Orthopedics;  Laterality: Left;    ALLERGIES:  is  allergic to bactrim.  MEDICATIONS:  Current Facility-Administered Medications  Medication Dose Route Frequency Provider Last Rate Last Dose  . 0.9 %  sodium chloride infusion  250 mL Intravenous PRN Srikar Sudini, MD      . 0.9 %  sodium chloride infusion   Intravenous Continuous Sital Mody, MD      . aspirin EC tablet 81 mg  81 mg Oral Daily Bettey Costa, MD   Stopped at 07/26/15 1017  . bisacodyl (DULCOLAX) suppository 10 mg  10 mg Rectal Daily PRN Srikar Sudini, MD      . ceFEPIme (MAXIPIME) 2 g in dextrose 5 % 50 mL IVPB  2 g Intravenous 3 times per day Bettey Costa, MD   2 g at 07/27/15 0600  . collagenase (SANTYL) ointment   Topical Daily Sital Mody, MD      . docusate sodium (COLACE) capsule 100 mg  100  mg Oral BID Hillary Bow, MD   Stopped at 07/26/15 1019  . feeding supplement (ENSURE ENLIVE) (ENSURE ENLIVE) liquid 237 mL  237 mL Oral TID WC Bettey Costa, MD   Stopped at 07/26/15 0749  . ferrous sulfate tablet 325 mg  325 mg Oral Q breakfast Bettey Costa, MD   Stopped at 07/26/15 0749  . insulin aspart (novoLOG) injection 0-5 Units  0-5 Units Subcutaneous QHS Hillary Bow, MD   3 Units at 07/23/15 2146  . insulin aspart (novoLOG) injection 0-9 Units  0-9 Units Subcutaneous TID WC Hillary Bow, MD   2 Units at 07/27/15 1200  . ipratropium-albuterol (DUONEB) 0.5-2.5 (3) MG/3ML nebulizer solution 3 mL  3 mL Nebulization Q4H PRN Srikar Sudini, MD      . lisinopril (PRINIVIL,ZESTRIL) tablet 10 mg  10 mg Oral Daily Bettey Costa, MD   Stopped at 07/26/15 1019  . mometasone-formoterol (DULERA) 200-5 MCG/ACT inhaler 2 puff  2 puff Inhalation BID Darylene Price Swayne, RPH      . ondansetron [facility]) tablet 4 mg  4 mg Oral Q6H PRN Hillary Bow, MD       Or  . ondansetron (ZOFRAN) injection 4 mg  4 mg Intravenous Q6H PRN Srikar Sudini, MD      . polyethylene glycol (MIRALAX / GLYCOLAX) packet 17 g  17 g Oral Daily PRN Srikar Sudini, MD      . potassium chloride 10 mEq in 100 mL IVPB  10 mEq Intravenous Q1 Hr  x 4 Sital Mody, MD      . pravastatin (PRAVACHOL) tablet 10 mg  10 mg Oral q1800 Sital Mody, MD   10 mg at 07/24/15 1800  . predniSONE (DELTASONE) tablet 10 mg  10 mg Oral Daily Bettey Costa, MD   Stopped at 07/26/15 1019  . sodium chloride 0.9 % injection 3 mL  3 mL Intravenous Q12H Srikar Sudini, MD   3 mL at 07/26/15 2200  . sodium chloride 0.9 % injection 3 mL  3 mL Intravenous PRN Hillary Bow, MD      . tiotropium [facility]) inhalation capsule 18 mcg  18 mcg Inhalation Daily Bettey Costa, MD   18 mcg at 07/24/15 1245    Vital Signs: BP 134/67 mmHg  Pulse 79  Temp(Src) 98.1 F (36.7 C) (Oral)  Resp 20  Ht 6' (1.829 m)  Wt 70.534 kg (155 lb 8 oz)  BMI 21.08 kg/m2  SpO2 98% Filed Weights   07/25/15 0614 07/26/15 0500 07/27/15 0500  Weight: 70.444 kg (155 lb 4.8 oz) 69.854 kg (154 lb) 70.534 kg (155 lb 8 oz)    Estimated body mass index is 21.08 kg/(m^2) as calculated from the following:   Height as of this encounter: 6' (1.829 m).   Weight as of this encounter: 70.534 kg (155 lb 8 oz).  PERFORMANCE STATUS (ECOG) : 4 - Bedbound  PHYSICAL EXAM: Restless during exam but did not open eyes or follow commands Eyes remained closed but he was moving his head and arms during my exame. Temporal wasting is noted No JVD or TM Hrt rrr with irreg beats Lungs with ronchi and decreased breath sounds in bases Abd soft and NT Ext with dressings to right heel where ulcers are located  --muscle wasting of legs is noted Skin w/o mottling or cyanosis ---I did not view his sacral or heel wounds today      LABS: CBC:    Component Value Date/Time   WBC 10.7* 07/27/2015 0540   WBC  7.2 10/21/2014 1331   HGB 9.5* 07/27/2015 0540   HGB 11.9* 10/21/2014 1331   HCT 29.3* 07/27/2015 0540   HCT 36.1* 10/21/2014 1331   PLT 253 07/27/2015 0540   PLT 206 10/21/2014 1331   MCV 90.3 07/27/2015 0540   MCV 90 10/21/2014 1331   NEUTROABS 19.8* 07/23/2015 1256   NEUTROABS 4.9 10/21/2014 1331    LYMPHSABS 0.8* 07/23/2015 1256   LYMPHSABS 1.6 10/21/2014 1331   MONOABS 0.4 07/23/2015 1256   MONOABS 0.4 10/21/2014 1331   EOSABS 0.0 07/23/2015 1256   EOSABS 0.2 10/21/2014 1331   BASOSABS 0.0 07/23/2015 1256   BASOSABS 0.0 10/21/2014 1331   Comprehensive Metabolic Panel:    Component Value Date/Time   NA 145 07/27/2015 0540   NA 137 10/21/2014 1331   K 3.3* 07/27/2015 0540   K 4.3 10/21/2014 1331   CL 116* 07/27/2015 0540   CL 108 10/21/2014 1331   CO2 26 07/27/2015 0540   CO2 24 10/21/2014 1331   BUN 23* 07/27/2015 0540   BUN 15 10/21/2014 1331   CREATININE 0.77 07/27/2015 0540   CREATININE 0.80 10/21/2014 1331   GLUCOSE 237* 07/27/2015 0540   GLUCOSE 157* 10/21/2014 1331   CALCIUM 7.7* 07/27/2015 0540   CALCIUM 9.0 10/21/2014 1331   AST 43* 07/26/2015 0646   ALT 463* 07/26/2015 0646   ALKPHOS 162* 07/26/2015 0646   BILITOT 2.2* 07/26/2015 0646   PROT 5.6* 07/26/2015 0646   ALBUMIN 2.3* 07/27/2015 1032   ECHO 06/16/15: - Left ventricle: Systolic function was mildly to moderately reduced. The estimated ejection fraction was in the range of 40% to 45%. Akinesis of the apical myocardium. Akinesis of the anteroseptal myocardium. - Aortic valve: Valve area (Vmax): 2.75 cm^2. - Mitral valve: There was moderate regurgitation. - Left atrium: The atrium was mildly dilated.   [place] RUQ abdomen  1. Cholelithiasis. 2. Mildly echogenic liver, most likely due to steatosis. --------------------------------------------------------------------------------------------------------- More than 50% of the visit was spent in counseling/coordination of care: Yes  Time Spent: 80 minutes

## 2015-07-27 NOTE — Progress Notes (Signed)
PT Cancellation Note  Patient Details Name: Riley Sanchez. MRN: [id number] DOB: [birth year]   Cancelled Treatment:    Reason Eval/Treat Not Completed: Medical issues which prohibited therapy (Per RN, patient remains generally lethargic, confused and unable to participate with therapy at this time.  Noted pending palliative care consult.  Will continue hold and attempt one additional time; if patient remains unable to participate, will discontinue initial order and request re-consult as patient more medically appropriate.)   Sherrel Ploch H. Manson Passey, PT, DPT, NCS 07/27/2015, 8:24 AM [phone]

## 2015-07-27 NOTE — Procedures (Signed)
ELECTROENCEPHALOGRAM REPORT   Patient: Riley Sanchez.       Room #: 217A-AA EEG No. ID: [id number] Age: 82 y.o.        Sex: male Referring Physician: Mody Report Date:  07/27/2015        Interpreting Physician: Thana Farr  History: Sricharan Lacomb. is an 82 y.o. male with altered mental status  Medications:  Scheduled: . aspirin EC  81 mg Oral Daily  . ceFEPime (MAXIPIME) IV  2 g Intravenous 3 times per day  . collagenase   Topical Daily  . docusate sodium  100 mg Oral BID  . feeding supplement (ENSURE ENLIVE)  237 mL Oral TID WC  . ferrous sulfate  325 mg Oral Q breakfast  . insulin aspart  0-5 Units Subcutaneous QHS  . insulin aspart  0-9 Units Subcutaneous TID WC  . lisinopril  10 mg Oral Daily  . mometasone-formoterol  2 puff Inhalation BID  . potassium chloride  10 mEq Intravenous Q1 Hr x 4  . pravastatin  10 mg Oral q1800  . predniSONE  10 mg Oral Daily  . sodium chloride  3 mL Intravenous Q12H  . tiotropium  18 mcg Inhalation Daily    Conditions of Recording:  This is a 16 channel EEG carried out with the patient in the awake and drowsy states.  Description:  The waking background activity consists of a low voltage, symmetrical, fairly well organized, 6-7 Hz theta activity, seen from the parieto-occipital and posterior temporal regions.  Low voltage fast activity, poorly organized, is seen anteriorly and is at times superimposed on more posterior regions.  A mixture of theta and alpha rhythms are seen from the central and temporal regions. The patient drowses with further slowing noted and delta activity seen more prominently.   No epileptiform activity is noted.  Stage II sleep is not obtained. Hyperventilation was not performed. Intermittent photic stimulation was performed but failed to illicit any change in the tracing.    IMPRESSION: This is an abnormal EEG secondary to posterior background slowing.  This finding may be seen with a diffuse gray matter  disturbance that is etiologically nonspecific, but may include a dementia, among other possibilities.     Thana Farr, MD Neurology [phone] 07/27/2015, 4:20 PM

## 2015-07-27 NOTE — Progress Notes (Signed)
ANTICOAGULATION CONSULT NOTE - Initial Consult  Pharmacy Consult for warfarin Indication: atrial fibrillation  Allergies  Allergen Reactions  . Bactrim [Sulfamethoxazole-Trimethoprim] Other (See Comments)    Reaction:  Unknown    Patient Measurements: Height: 6' (182.9 cm) Weight: 155 lb 8 oz (70.534 kg) IBW/kg (Calculated) : 77.6  Vital Signs: BP: 134/67 mmHg (01/23 0512) Pulse Rate: 79 (01/23 0512)  Labs:  Recent Labs  07/25/15 0610 07/26/15 0646 07/27/15 0540 07/27/15 1124  HGB 10.3* 10.8* 9.5*  --   HCT 31.5* 33.8* 29.3*  --   PLT 392 350 253  --   LABPROT 44.3* 29.0*  --  23.6*  INR 4.91* 2.79  --  2.12  CREATININE 0.98 1.01 0.77  --     Estimated Creatinine Clearance: 69.8 mL/min (by C-G formula based on Cr of 0.77).  Assessment: Pharmacy consulted to resume warfarin dosing in this 82 year old male with a PMH significant for atrial fibrillation. Patient was taking warfarin prior to admission with a home regimen of warfarin 6 mg PO daily.   Patient was recently discharged from the hospital on 1/16 after being treated for pneumonia and was discharged on PO levofloxacin. Patient was readmitted on 1/19 with supratherapeutic INR of 5.24 most likely attributed to drug interaction between levofloxacin & warfarin. INR increased to 7.94 on 1/20 and patient received 2.5 mg of vitamin K. No significant DDI with medications currently prescribed.  INR is within therapeutic range today at 2.12 so warfarin dosing will be resumed  Dosing history: Date INR Dose 1/19 5.24 Held 1/20 7.94 Held 1/21 4.91 Held  1/22 2.79 Held 1/23 2.12 5 mg   Goal of Therapy:  INR 2-3 Monitor platelets by anticoagulation protocol: Yes   Plan:  Will give warfarin 5 mg PO dose x1 at 1800 this evening  I believe elevated INR on admission most likely related to drug interaction with levofloxacin, will restart warfarin at 5 mg dose. INR will likely continue to decrease tomorrow as a result of  doses being held for several days and vitamin K being administered. It will take ~3 days to see an INR reflective of resumed dosing.  Pharmacy will continue to monitor, thank you for the consult.  Cindi Carbon, PharmD Clinical Pharmacist 07/27/2015,2:02 PM

## 2015-07-28 DIAGNOSIS — D649 Anemia, unspecified: Secondary | ICD-10-CM

## 2015-07-28 DIAGNOSIS — R4182 Altered mental status, unspecified: Secondary | ICD-10-CM | POA: Insufficient documentation

## 2015-07-28 LAB — COMPREHENSIVE METABOLIC PANEL
ALBUMIN: 2.4 g/dL — AB (ref 3.5–5.0)
ALK PHOS: 148 U/L — AB (ref 38–126)
ALT: 214 U/L — ABNORMAL HIGH (ref 17–63)
AST: 32 U/L (ref 15–41)
Anion gap: 6 (ref 5–15)
BILIRUBIN TOTAL: 2.2 mg/dL — AB (ref 0.3–1.2)
BUN: 17 mg/dL (ref 6–20)
CALCIUM: 7.8 mg/dL — AB (ref 8.9–10.3)
CO2: 25 mmol/L (ref 22–32)
Chloride: 115 mmol/L — ABNORMAL HIGH (ref 101–111)
Creatinine, Ser: 0.66 mg/dL (ref 0.61–1.24)
GFR calc Af Amer: >60 mL/min (ref >60)
GFR calc non Af Amer: >60 mL/min (ref >60)
GLUCOSE: 153 mg/dL — AB (ref 65–99)
Potassium: 3.7 mmol/L (ref 3.5–5.1)
Sodium: 146 mmol/L — ABNORMAL HIGH (ref 135–145)
TOTAL PROTEIN: 5.3 g/dL — AB (ref 6.5–8.1)

## 2015-07-28 LAB — CBC
HCT: 33.2 % — ABNORMAL LOW (ref 40.0–52.0)
HEMOGLOBIN: 10.6 g/dL — AB (ref 13.0–18.0)
MCH: 29.1 pg (ref 26.0–34.0)
MCHC: 31.8 g/dL — ABNORMAL LOW (ref 32.0–36.0)
MCV: 91.4 fL (ref 80.0–100.0)
PLATELETS: 249 10*3/uL (ref 150–440)
RBC: 3.63 MIL/uL — AB (ref 4.40–5.90)
RDW: 18.2 % — ABNORMAL HIGH (ref 11.5–14.5)
WBC: 11.8 10*3/uL — ABNORMAL HIGH (ref 3.8–10.6)

## 2015-07-28 LAB — CULTURE, BLOOD (ROUTINE X 2)
Culture: NO GROWTH
Culture: NO GROWTH

## 2015-07-28 LAB — PROTIME-INR
INR: 1.81
Prothrombin Time: 20.9 seconds — ABNORMAL HIGH (ref 11.4–15.0)

## 2015-07-28 LAB — GLUCOSE, CAPILLARY
GLUCOSE-CAPILLARY: 127 mg/dL — AB (ref 65–99)
Glucose-Capillary: 152 mg/dL — ABNORMAL HIGH (ref 65–99)

## 2015-07-28 MED ORDER — PROCHLORPERAZINE 25 MG RE SUPP
25.0000 mg | Freq: Three times a day (TID) | RECTAL | Status: DC | PRN
  Filled 2015-07-28: qty 1

## 2015-07-28 MED ORDER — GLYCOPYRROLATE 1 MG PO TABS: 1.0000 mg | ORAL_TABLET | Freq: Three times a day (TID) | ORAL | Status: DC | PRN

## 2015-07-28 MED ORDER — MORPHINE SULFATE (CONCENTRATE) 10 MG/0.5ML PO SOLN: 5.0000 mg | Freq: Four times a day (QID) | ORAL | Status: AC

## 2015-07-28 MED ORDER — PIPERACILLIN-TAZOBACTAM 3.375 G IVPB
3.3750 g | Freq: Three times a day (TID) | INTRAVENOUS | Status: DC
  Filled 2015-07-28 (×3): qty 50

## 2015-07-28 MED ORDER — MORPHINE SULFATE (CONCENTRATE) 10 MG/0.5ML PO SOLN
5.0000 mg | Freq: Four times a day (QID) | ORAL | Status: DC
  Administered 2015-07-28 – 2015-07-29 (×3): 5 mg via ORAL
  Filled 2015-07-28 (×3): qty 1

## 2015-07-28 MED ORDER — SCOPOLAMINE 1 MG/3DAYS TD PT72
1.0000 | MEDICATED_PATCH | TRANSDERMAL | Status: DC
  Administered 2015-07-28: 1.5 mg via TRANSDERMAL
  Filled 2015-07-28: qty 1

## 2015-07-28 MED ORDER — ONDANSETRON 4 MG PO TBDP
4.0000 mg | ORAL_TABLET | Freq: Three times a day (TID) | ORAL | Status: DC | PRN
  Filled 2015-07-28: qty 1

## 2015-07-28 MED ORDER — LORAZEPAM 2 MG/ML PO CONC
0.5000 mg | ORAL | Status: DC | PRN
  Administered 2015-07-28: 14:00:00 0.5 mg via ORAL
  Filled 2015-07-28: qty 1

## 2015-07-28 MED ORDER — GLYCOPYRROLATE 0.2 MG/ML IJ SOLN
0.1000 mg | Freq: Two times a day (BID) | INTRAMUSCULAR | Status: DC
  Filled 2015-07-28 (×2): qty 0.5

## 2015-07-28 MED ORDER — LORAZEPAM 0.5 MG PO TABS: 0.5000 mg | ORAL_TABLET | ORAL | Status: AC | PRN

## 2015-07-28 MED ORDER — DEXTROSE 5 % IV SOLN
INTRAVENOUS | Status: DC
  Administered 2015-07-28: 11:00:00 via INTRAVENOUS

## 2015-07-28 MED ORDER — BISACODYL 10 MG RE SUPP: 10.0000 mg | Freq: Every day | RECTAL | Status: AC | PRN

## 2015-07-28 MED ORDER — MORPHINE SULFATE (CONCENTRATE) 10 MG/0.5ML PO SOLN
5.0000 mg | ORAL | Status: DC | PRN
  Administered 2015-07-28 – 2015-07-29 (×2): 5 mg via ORAL
  Filled 2015-07-28 (×3): qty 1

## 2015-07-28 MED ORDER — SCOPOLAMINE 1 MG/3DAYS TD PT72: 1.0000 | MEDICATED_PATCH | TRANSDERMAL | Status: AC

## 2015-07-28 MED ORDER — LORAZEPAM 2 MG/ML IJ SOLN: 0.5000 mg | INTRAMUSCULAR | Status: DC

## 2015-07-28 MED ORDER — MORPHINE SULFATE (CONCENTRATE) 10 MG/0.5ML PO SOLN: 5.0000 mg | ORAL | Status: AC | PRN

## 2015-07-28 MED ORDER — PROCHLORPERAZINE 25 MG RE SUPP: 25.0000 mg | Freq: Three times a day (TID) | RECTAL | Status: AC | PRN

## 2015-07-28 MED ORDER — PIPERACILLIN-TAZOBACTAM 4.5 G IVPB
4.5000 g | Freq: Three times a day (TID) | INTRAVENOUS | Status: DC
  Filled 2015-07-28 (×3): qty 100

## 2015-07-28 NOTE — Progress Notes (Signed)
Nutrition Brief Note  Chart reviewed. Pt now transitioning to comfort care.  No further nutrition interventions warranted at this time.  Please re-consult as needed.   Milyn Stapleton B. Wlliam Grosso, RD, LDN [phone] (pager) Weekend/On-Call pager ([phone])    

## 2015-07-28 NOTE — Progress Notes (Signed)
[employer] called to deactivate patient ICD - representative aware. Bo Mcclintock, RN

## 2015-07-28 NOTE — Plan of Care (Signed)
Problem: Nutrition: Goal: Adequate nutrition will be maintained Outcome: Not Met (add Reason) Pt remains confused, at risk for aspiration, and is NPO. SLP will follow up.

## 2015-07-28 NOTE — Progress Notes (Signed)
[facility] - [place] at [facility]   PATIENT NAME: Riley Sanchez    MR#:  [id number]  DATE OF BIRTH:  05/07/1930  SUBJECTIVE:   Wife at bedside they are meeting with Dr Orvan Falconer this morning. He is still lethargic  REVIEW OF SYSTEMS:    Review of Systems  Unable to perform ROS: medical condition    Tolerating Diet patient is nothing by mouth as per speech consultation with strict aspiration cautions     DRUG ALLERGIES:   Allergies  Allergen Reactions  . Bactrim [Sulfamethoxazole-Trimethoprim] Other (See Comments)    Reaction:  Unknown     VITALS:  Blood pressure 158/81, pulse 70, temperature 97.8 F (36.6 C), temperature source Oral, resp. rate 20, height 6' (1.829 m), weight 70.308 kg (155 lb), SpO2 100 %.  PHYSICAL EXAMINATION:   Physical Exam  Constitutional: He is well-developed, well-nourished, and in no distress. No distress.  HENT:  Head: Normocephalic.  Eyes: No scleral icterus.  Neck: Normal range of motion. Neck supple. No JVD present. No tracheal deviation present.  Cardiovascular: Normal rate, regular rhythm and normal heart sounds.  Exam reveals no gallop and no friction rub.   No murmur heard. Pulmonary/Chest: Effort normal. No respiratory distress. He has no wheezes. He has no rales. He exhibits no tenderness.  Bilateral rhonchi  Abdominal: Soft. Bowel sounds are normal. He exhibits no distension and no mass. There is no tenderness. There is no rebound and no guarding.  Musculoskeletal: Normal range of motion. He exhibits no edema.  Neurological: He is alert.  Lethargic  Moves all extremities but does not follow commands  Skin: Skin is warm. No rash noted. No erythema.      LABORATORY PANEL:   CBC  Recent Labs Lab 07/28/15 0535  WBC 11.8*  HGB 10.6*  HCT 33.2*  PLT 249   ------------------------------------------------------------------------------------------------------------------  Chemistries    Recent Labs Lab 07/28/15 0535  NA 146*  K 3.7  CL 115*  CO2 25  GLUCOSE 153*  BUN 17  CREATININE 0.66  CALCIUM 7.8*  AST 32  ALT 214*  ALKPHOS 148*  BILITOT 2.2*   ------------------------------------------------------------------------------------------------------------------  Cardiac Enzymes No results for input(s): TROPONINI in the last 168 hours. ------------------------------------------------------------------------------------------------------------------  RADIOLOGY:  Dg Chest 1 View  07/26/2015  CLINICAL DATA:  Cough, pneumonia EXAM: CHEST 1 VIEW COMPARISON:  07/23/2015 FINDINGS: Cardiomegaly again noted. Status post CABG. Three leads cardiac pacemaker is unchanged in position. Again noted hazy infiltrate/pneumonia in left lower lobe. There is new infiltrate/pneumonia in right upper lobe perihilar. IMPRESSION: Again noted hazy infiltrate/pneumonia in left lower lobe. There is new infiltrate/pneumonia in right upper lobe perihilar. Electronically Signed   By: Natasha Mead M.D.   On: 07/26/2015 12:28   Ct Head Wo Contrast  07/26/2015  CLINICAL DATA:  Altered mental status EXAM: CT HEAD WITHOUT CONTRAST TECHNIQUE: Contiguous axial images were obtained from the base of the skull through the vertex without intravenous contrast. COMPARISON:  07/23/2015 FINDINGS: Severe diffuse atrophy and low attenuation in the deep white matter. Proportional dilatation of the ventricles. Chronic tiny basal ganglia lacunar infarcts. Probable tiny lacunar infarct likely subacute to chronic right brainstem. No vascular territory infarct. No evidence of mass. No hemorrhage or extra-axial fluid. Inflammatory change in the sphenoid sinuses similar to prior study. IMPRESSION: No acute findings. Age-related involutional change and sinus inflammation stable. Electronically Signed   By: Esperanza Heir M.D.   On: 07/26/2015 14:32     ASSESSMENT AND PLAN:  82 year old male with a history of  hypertension, diabetes and atrial fibrillation who presented with an INR of 7 and found to have pneumonia.  1. Left lower lobe pneumonia: Patient was being treated for community-acquired pneumonia on Levaquin. He needed broader coverage and therefore was started on cefepime. Dr Thad Ranger thinks that cefepime may be causing alteration in mental status and therefore I'll discontinue this and started patient on Zosyn. White blood cell count has improved.  Blood cultures are negative to date. Right new infiltrate on chest x-rays likely due to aspiration.  2. Supratherapeutic INR: At this time coumadin is being held as patient is NPO.   3. Dysphagia: As per speech consultation, patient is at moderate risk for aspiration. Patient is currently nothing by mouth with aspiration precautions. Palliative care will discuss with the patient's family about need for PEG tube.  4. Metabolic encephalopathy: This is due to a combination of pneumonia as well as recent hospitalizations with delirium. patient now has hypernatremia which is also continuing to metabolic encephalopathy. CAT scan of the head on admission shows no acute intracranial hemorrhage as well as repeat CT scan on January 22.Marland Kitchen He remains encephalopathic. EEG shows secondary to posterior background slowing. This finding may be seen with a diffuse gray matter disturbance that is etiologically nonspecific, but may include a dementia, among other possibilities. Appreciate NEUROLOGY consult.   5. Insulin-dependent diabetes: Continue sliding scale insulin. Careful monitoring of blood sugars as I have started D5 for hypernatremia.  6. Chronic diastolic heart failure: No signs of fluid overload.  7. Paroxysmal atrial fibrillation: Heart rate is controlled. Continue monitoring INR. Coumadin is on hold. May need to reconsider need for anticoagulation.  8. Numerous wounds on heels and stage II pressure ulcer on buttocks present on admission: Wound care  consult appreciated.   Cleanse right heel with NS and pat gently dry. Apply Santyl ointment to wound bed. Cover with NS moist gauze (to wound bed only). Cover with dry 4x4 gauze and kerlix. Change daily. Prevalon boot while in bed.   Cleanse sacrum/coccyx with soap and water and pat gently dry. Apply barrier cream each shift and PRN incontinence. No disposable briefs or underpads under patient. Dermatherapy wicking linen under patient only. Turn and reposition to offload pressure.   9. Elevated LFT:  This is likely due to acute illness with ischemia and hypoxia. LFTs have improved. Right upper quadrant ultrasound shows cholelithiasis. Avoid hepatotoxic agents.  10. Essential hypertension: Continue lisinopril   10. Hypernatremia: Sodium level is back up to 146. I will restart D5.  CODE STATUS: full TOTAL TIME TAKING CARE OF THIS PATIENT: 31 minutes.   Discussed with patient's wife at bedside this morning.  POSSIBLE D/C 3-5 days, DEPENDING ON CLINICAL CONDITION.   Natalye Kott M.D on 07/28/2015 at 10:30 AM  Between 7am to 6pm - Pager - [phone] After 6pm go to [email or web address] - password [email or web address]  [facility]  Office  [phone]  CC: Primary care physician; Sula Rumple, MD  Note: This dictation was prepared with Dragon dictation along with smaller phrase technology. Any transcriptional errors that result from this process are unintentional.

## 2015-07-28 NOTE — Discharge Summary (Deleted)
[facility] Physicians - [place] at [facility]   PATIENT NAME: Riley Sanchez    MR#:  [id number]  DATE OF BIRTH:  1929-10-14  DATE OF ADMISSION:  07/23/2015 ADMITTING PHYSICIAN: Milagros Loll, MD  DATE OF DISCHARGE: 07/28/2015  PRIMARY CARE PHYSICIAN: Sula Rumple, MD    ADMISSION DIAGNOSIS:  Transaminitis [R74.0]  DISCHARGE DIAGNOSIS:  Active Problems:   Pneumonia   Pressure ulcer   Altered mental status   SECONDARY DIAGNOSIS:   Past Medical History  Diagnosis Date  . Diabetes mellitus without complication (HCC)   . Hypertension   . A-fib (HCC)   . GERD (gastroesophageal reflux disease)   . CHF (congestive heart failure) (HCC)   . Fracture, femur [facility])     HOSPITAL COURSE:   82 year old male with a history of hypertension, diabetes and atrial fibrillation who presented with an INR of 7 and found to have pneumonia.  1. Left lower lobe pneumonia: Patient was being treated for community-acquired pneumonia on Levaquin. He needed broader coverage and therefore was started on cefepime. Dr Thad Ranger thinks that cefepime may be causing alteration in mental status and therefore I discontinued this and started patient on Zosyn. White blood cell count has improved.  Blood cultures are negative to date. Right new infiltrate on chest x-rays likely due to aspiration.  2. Supratherapeutic INR: At this time coumadin is being held as patient is NPO.   3. Dysphagia: As per speech consultation, patient is at moderate risk for aspiration. Patient is currently nothing by mouth with aspiration precautions. Palliative care will discuss with the patient's family about need for PEG tube.  4. Metabolic encephalopathy: This is due to a combination of pneumonia as well as recent hospitalizations with delirium. patient now has hypernatremia which is also continuing to metabolic encephalopathy. CAT scan of the head on admission shows no acute intracranial hemorrhage as well as  repeat CT scan on January 22.Marland Kitchen He remains encephalopathic. EEG shows secondary to posterior background slowing. This finding may be seen with a diffuse gray matter disturbance that is etiologically nonspecific, but may include a dementia, among other possibilities. Appreciate NEUROLOGY consult.   5. Insulin-dependent diabetes: Continue sliding scale insulin. Careful monitoring of blood sugars as I have started D5 for hypernatremia.  6. Chronic diastolic heart failure: No signs of fluid overload.  7. Paroxysmal atrial fibrillation: Heart rate is controlled. Continue monitoring INR. Coumadin is on hold. May need to reconsider need for anticoagulation.  8. Numerous wounds on heels and stage II pressure ulcer on buttocks present on admission: Wound care consult appreciated.   Cleanse right heel with NS and pat gently dry. Apply Santyl ointment to wound bed. Cover with NS moist gauze (to wound bed only). Cover with dry 4x4 gauze and kerlix. Change daily. Prevalon boot while in bed.   Cleanse sacrum/coccyx with soap and water and pat gently dry. Apply barrier cream each shift and PRN incontinence. No disposable briefs or underpads under patient. Dermatherapy wicking linen under patient only. Turn and reposition to offload pressure.   9. Elevated LFT: This is likely due to acute illness with ischemia and hypoxia. LFTs have improved. Right upper quadrant ultrasound shows cholelithiasis. Avoid hepatotoxic agents.  10. Essential hypertension: Continue lisinopril   10. Hypernatremia: Sodium level is back up to 146. He was on D5.  Patient was seen by Palliative care and due to his decline family decided on comfort care and will go to [facility]    DISCHARGE CONDITIONS AND DIET:  Hospice home  CONSULTS OBTAINED:  Treatment Team:  Kym Groom, MD Thana Farr, MD  DRUG ALLERGIES:   Allergies  Allergen Reactions  . Bactrim [Sulfamethoxazole-Trimethoprim] Other (See  Comments)    Reaction:  Unknown     DISCHARGE MEDICATIONS:   Current Discharge Medication List    START taking these medications   Details  bisacodyl (DULCOLAX) 10 MG suppository Place 1 suppository (10 mg total) rectally daily as needed for moderate constipation. Qty: 6 suppository, Refills: 0    glycopyrrolate (ROBINUL) 1 MG tablet Take 1 tablet (1 mg total) by mouth 3 (three) times daily as needed (excessive secretions). Qty: 15 tablet, Refills: 0    LORazepam (ATIVAN) 0.5 MG tablet Take 1 tablet (0.5 mg total) by mouth every 4 (four) hours as needed for anxiety. Qty: 15 tablet, Refills: 0    !! Morphine Sulfate (MORPHINE CONCENTRATE) 10 MG/0.5ML SOLN concentrated solution Take 0.25 mLs (5 mg total) by mouth every 2 (two) hours as needed for moderate pain, severe pain or shortness of breath. Qty: 30 mL, Refills: 0    !! Morphine Sulfate (MORPHINE CONCENTRATE) 10 MG/0.5ML SOLN concentrated solution Take 0.25 mLs (5 mg total) by mouth every 6 (six) hours. Qty: 30 mL, Refills: 0    prochlorperazine (COMPAZINE) 25 MG suppository Place 1 suppository (25 mg total) rectally every 8 (eight) hours as needed for nausea or vomiting. Qty: 6 suppository, Refills: 0     !! - Potential duplicate medications found. Please discuss with provider.    STOP taking these medications     acetaminophen (TYLENOL) 325 MG tablet      aspirin EC 81 MG tablet      benzonatate (TESSALON) 200 MG capsule      cholecalciferol (VITAMIN D) 1000 UNITS tablet      diphenhydrAMINE (BENADRYL) 25 mg capsule      docusate sodium (COLACE) 100 MG capsule      feeding supplement, ENSURE ENLIVE, (ENSURE ENLIVE) LIQD      ferrous sulfate 325 (65 FE) MG tablet      gabapentin (NEURONTIN) 100 MG capsule      guaiFENesin (MUCINEX) 600 MG 12 hr tablet      guaiFENesin-codeine 100-10 MG/5ML syrup      insulin aspart (NOVOLOG) 100 UNIT/ML injection      ipratropium-albuterol (DUONEB) 0.5-2.5 (3) MG/3ML SOLN       levofloxacin (LEVAQUIN) 500 MG tablet      lisinopril (PRINIVIL,ZESTRIL) 10 MG tablet      lovastatin (MEVACOR) 40 MG tablet      menthol-cetylpyridinium (CEPACOL) 3 MG lozenge      mometasone-formoterol (DULERA) 200-5 MCG/ACT AERO      omeprazole (PRILOSEC) 20 MG capsule      oxyCODONE (OXY IR/ROXICODONE) 5 MG immediate release tablet      polyethylene glycol (MIRALAX / GLYCOLAX) packet      predniSONE (STERAPRED UNI-PAK 21 TAB) 10 MG (21) TBPK tablet      senna (SENOKOT) 8.6 MG TABS tablet      tiotropium (SPIRIVA) 18 MCG inhalation capsule      warfarin (COUMADIN) 6 MG tablet      chlorpheniramine-HYDROcodone (TUSSIONEX) 10-8 MG/5ML SUER               Today   CHIEF COMPLAINT:  Patient is lethargic no change in mentation   VITAL SIGNS:  Blood pressure 158/81, pulse 70, temperature 97.8 F (36.6 C), temperature source Oral, resp. rate 20, height 6' (1.829 m), weight 70.308 kg (155  lb), SpO2 100 %.   REVIEW OF SYSTEMS:  Review of Systems  Unable to perform ROS: acuity of condition     PHYSICAL EXAMINATION:  GENERAL:  82 y.o.-year-old patient lying in the bed with no acute distress.  NECK:  Supple, no jugular venous distention. No thyroid enlargement, no tenderness.  LUNGS: coarse breath sounds bilateral NO rales No use of accessory muscles of respiration.  CARDIOVASCULAR: S1, S2 normal. No murmurs, rubs, or gallops.  ABDOMEN: Soft, non-tender, non-distended. Bowel sounds present. No organomegaly or mass.  EXTREMITIES: No pedal edema, cyanosis, or clubbing.  PSYCHIATRIC: The patient is lethargic SKIN: No obvious rash, lesion, or ulcer.   DATA REVIEW:   CBC  Recent Labs Lab 07/28/15 0535  WBC 11.8*  HGB 10.6*  HCT 33.2*  PLT 249    Chemistries   Recent Labs Lab 07/28/15 0535  NA 146*  K 3.7  CL 115*  CO2 25  GLUCOSE 153*  BUN 17  CREATININE 0.66  CALCIUM 7.8*  AST 32  ALT 214*  ALKPHOS 148*  BILITOT 2.2*    Cardiac  Enzymes No results for input(s): TROPONINI in the last 168 hours.  Microbiology Results  @[id number] @  RADIOLOGY:  Ct Head Wo Contrast  07/26/2015  CLINICAL DATA:  Altered mental status EXAM: CT HEAD WITHOUT CONTRAST TECHNIQUE: Contiguous axial images were obtained from the base of the skull through the vertex without intravenous contrast. COMPARISON:  07/23/2015 FINDINGS: Severe diffuse atrophy and low attenuation in the deep white matter. Proportional dilatation of the ventricles. Chronic tiny basal ganglia lacunar infarcts. Probable tiny lacunar infarct likely subacute to chronic right brainstem. No vascular territory infarct. No evidence of mass. No hemorrhage or extra-axial fluid. Inflammatory change in the sphenoid sinuses similar to prior study. IMPRESSION: No acute findings. Age-related involutional change and sinus inflammation stable. Electronically Signed   By: Esperanza Heir M.D.   On: 07/26/2015 14:32      Management plans discussed with the patient and she is in agreement.  Patient should follow up with HOSPICE  CODE STATUS:     Code Status Orders        Start     Ordered   07/28/15 1109  Do not attempt resuscitation (DNR)   Continuous    Question Answer Comment  In the event of cardiac or respiratory ARREST Do not call a "code blue"   In the event of cardiac or respiratory ARREST Do not perform Intubation, CPR, defibrillation or ACLS   In the event of cardiac or respiratory ARREST Use medication by any route, position, wound care, and other measures to relive pain and suffering. May use oxygen, suction and manual treatment of airway obstruction as needed for comfort.      07/28/15 1108    Code Status History    Date Active Date Inactive Code Status Order ID Comments User Context   07/23/2015  4:07 PM 07/28/2015 11:06 AM Full Code [id number]  Milagros Loll, MD ED   07/15/2015 11:05 PM 07/20/2015  7:05 PM Full Code [id number]  Wyatt Haste, MD ED   06/14/2015  3:10 AM  06/19/2015  6:54 PM Full Code [id number]  Katharina Caper, MD Inpatient   05/19/2015  2:07 PM 05/22/2015  5:04 PM Full Code [id number]  Juanell Fairly, MD Inpatient   05/18/2015  5:32 PM 05/19/2015  2:07 PM Full Code [id number]  Enedina Finner, MD Inpatient   05/18/2015  5:18 PM 05/18/2015  5:32 PM Full Code [id number]  Juanell Fairly, MD  ED      TOTAL TIME TAKING CARE OF THIS PATIENT: 35 minutes.    Note: This dictation was prepared with Dragon dictation along with smaller phrase technology. Any transcriptional errors that result from this process are unintentional.  Analeigha Nauman M.D on 07/28/2015 at 11:48 AM  Between 7am to 6pm - Pager - [phone] After 6pm go to [email or web address] - password [email or web address] [facility]  [facility]  Office  [phone]  CC: Primary care physician; Sula Rumple, MD

## 2015-07-28 NOTE — Clinical Social Work Note (Signed)
Patient transitioned to comfort care and to be transferred to 1C. Patient to be transferred to hospice home tomorrow. York Spaniel MSW,LCSW [phone]

## 2015-07-28 NOTE — Progress Notes (Signed)
PT Cancellation Note  Patient Details Name: Riley Sanchez. MRN: [id number] DOB: Sep 22, 1929   Cancelled Treatment:    Reason Eval/Treat Not Completed: Medical issues which prohibited therapy (Per chart review, patient now transitioned to comfort care due to continued medical decline.  Will complete initial PT order at this time; please re-consult should goals of care change.)   Antar Milks H. Manson Passey, PT, DPT, NCS 07/28/2015, 11:20 AM [phone]

## 2015-07-28 NOTE — Plan of Care (Signed)
Problem: Coping: Goal: Ability to identify and develop effective coping behavior will improve Outcome: Progressing Family at bedside.

## 2015-07-28 NOTE — Progress Notes (Signed)
ANTIBIOTIC CONSULT NOTE - INITIAL  Pharmacy Consult for Zosyn dosing Indication: HCAP  Allergies  Allergen Reactions  . Bactrim [Sulfamethoxazole-Trimethoprim] Other (See Comments)    Reaction:  Unknown     Patient Measurements: Height: 6' (182.9 cm) Weight: 155 lb (70.308 kg) IBW/kg (Calculated) : 77.6 Adjusted Body Weight: n/a  Vital Signs: Temp: 97.8 F (36.6 C) (01/23 2050) BP: 158/81 mmHg (01/24 0622) Pulse Rate: 70 (01/24 0622) Intake/Output from previous day: 01/23 0701 - 01/24 0700 In: 1611.8 [I.V.:1461.8; IV Piggyback:150] Out: -  Intake/Output from this shift:    Labs:  Recent Labs  07/26/15 0646 07/27/15 0540 07/28/15 0535  WBC 17.4* 10.7* 11.8*  HGB 10.8* 9.5* 10.6*  PLT 350 253 249  CREATININE 1.01 0.77 0.66   Estimated Creatinine Clearance: 69.6 mL/min (by C-G formula based on Cr of 0.66). No results for input(s): VANCOTROUGH, VANCOPEAK, VANCORANDOM, GENTTROUGH, GENTPEAK, GENTRANDOM, TOBRATROUGH, TOBRAPEAK, TOBRARND, AMIKACINPEAK, AMIKACINTROU, AMIKACIN in the last 72 hours.   Microbiology: Recent Results (from the past 720 hour(s))  Blood culture (routine x 2)     Status: None   Collection Time: 07/15/15  7:30 PM  Result Value Ref Range Status   Specimen Description BLOOD RIGHT ASSIST CONTROL  Final   Special Requests   Final    BOTTLES DRAWN AEROBIC AND ANAEROBIC ANA 2CC AERO 4CC   Culture NO GROWTH 6 DAYS  Final   Report Status 07/21/2015 FINAL  Final  Blood culture (routine x 2)     Status: None   Collection Time: 07/15/15  7:47 PM  Result Value Ref Range Status   Specimen Description BLOOD RIGHT ASSIST CONTROL  Final   Special Requests BOTTLES DRAWN AEROBIC AND ANAEROBIC 2CC  Final   Culture NO GROWTH 6 DAYS  Final   Report Status 07/21/2015 FINAL  Final  C difficile quick scan w PCR reflex     Status: None   Collection Time: 07/16/15  2:50 AM  Result Value Ref Range Status   C Diff antigen NEGATIVE NEGATIVE Final   C Diff toxin  NEGATIVE NEGATIVE Final   C Diff interpretation Negative for C. difficile  Final  MRSA PCR Screening     Status: None   Collection Time: 07/16/15  6:08 AM  Result Value Ref Range Status   MRSA by PCR NEGATIVE NEGATIVE Final    Comment:        The GeneXpert MRSA Assay (FDA approved for NASAL specimens only), is one component of a comprehensive MRSA colonization surveillance program. It is not intended to diagnose MRSA infection nor to guide or monitor treatment for MRSA infections.   Culture, expectorated sputum-assessment     Status: None   Collection Time: 07/17/15  8:30 AM  Result Value Ref Range Status   Specimen Description EXPECTORATED SPUTUM  Final   Special Requests Normal  Final   Sputum evaluation THIS SPECIMEN IS ACCEPTABLE FOR SPUTUM CULTURE  Final   Report Status 07/17/2015 FINAL  Final  Culture, respiratory (NON-Expectorated)     Status: None   Collection Time: 07/17/15  8:30 AM  Result Value Ref Range Status   Specimen Description EXPECTORATED SPUTUM  Final   Special Requests Normal Reflexed from [id number]  Final   Gram Stain   Final    GOOD SPECIMEN - 80-90% WBCS MANY WBC SEEN FEW GRAM POSITIVE COCCI IN PAIRS IN CLUSTERS FEW YEAST    Culture Consistent with normal respiratory flora.  Final   Report Status 07/20/2015 FINAL  Final  Culture,  blood (routine x 2)     Status: None (Preliminary result)   Collection Time: 07/23/15  3:59 PM  Result Value Ref Range Status   Specimen Description BLOOD LEFT WRIST  Final   Special Requests BOTTLES DRAWN AEROBIC AND ANAEROBIC  1CC  Final   Culture NO GROWTH 4 DAYS  Final   Report Status PENDING  Incomplete  Culture, blood (routine x 2)     Status: None (Preliminary result)   Collection Time: 07/23/15  3:59 PM  Result Value Ref Range Status   Specimen Description BLOOD RIGHT ANTECUBITAL  Final   Special Requests BOTTLES DRAWN AEROBIC AND ANAEROBIC  2CC  Final   Culture NO GROWTH 4 DAYS  Final   Report Status PENDING   Incomplete    Medical History: Past Medical History  Diagnosis Date  . Diabetes mellitus without complication (HCC)   . Hypertension   . A-fib (HCC)   . GERD (gastroesophageal reflux disease)   . CHF (congestive heart failure) (HCC)   . Fracture, femur (HCC)     Medications:   Assessment: Blood cx NGTD CXR: LLL pneumonia  Goal of Therapy:  Resolution of infection  Plan:  Zosyn 4.5 grams q 8 hours ordered for Pseudomonas risk of recent abx usage.  Jeff Frieden S 07/28/2015,7:06 AM

## 2015-07-28 NOTE — Progress Notes (Addendum)
Palliative Medicine Inpatient Consult Follow Up Note   Name: Riley Sanchez. Date: 07/28/2015 MRN: [id number]  DOB: 1929/08/10  Referring Physician: Bettey Costa, MD  Palliative Care consult requested for this 82 y.o. male for goals of medical therapy in patient with sepsis, multiple medical problems, and altered mental status. 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 Since then, he has been going downhill, physically and cognitively. This is his third hospitalization since November. He was here in December for pneumonia with hemoptysis. He was just hospitalized here last week and returns from the skilled facility where he resides with a cough and fever. An Xray showed Left lower lobe infiltrate with a WBC of 20k. He has other conditions as detailed below. He had a dysphagia work up at the facility and was due to have another eval for swallowing at the facilty soon.  I had spoken with son last evening primarily by phone. At that time, the son seemed to be overly optimistic about his father being able to wake up and even be back to his normal self --enough so to be able to authorize POA forms etc.  I had been told that pt's wife was not accepting of pts condition, but that is not the case today.  _____________________________________________________________   TODAY'S DISCUSSIONS AND DECISIONS: I met with the pts wife, son, and dtr-in-law privately in a family meeting room after examining the pt.  I was a bit concerned about how the meeting would go based on a lot of resistance to accepting the bad news I was conveying last evening. I was concerned about not being totally honest with pt's wife, as son had asked me to 'please do not tell my mother that he might be someone ready for hospice --please do not tell her that'.    I asked the family what they thought was going on and pts wife was most honest about her view that  he was not the same and also was probably not going to come back and be like he was. I then asked what kind of information she wanted from me --hopeful --or reality-based. She wanted me to be blunt.   This gave me permission to initiate a discussion about feeding tubes --and what a tube might or might not fix.  They were all made aware that at this point, he is not even able to manage his saliva, and a feeding tube would not fix this.  Pts wife was unequivocally NOT in favor of a feeding tube.  Son and daughter in law agreed with her.  Then, we discussed comfort care. I presented the option for hospice in the setting of the facility ([facility]) BUT the pts wife is very familiar with [facility] and she initiated a request that her husband go there if possible.   I have conveyed as much to care mgmt/ soc work staff and will talk with Dr. Benjie Karvonen.  i will initiate comfort care orders and will do DC orders in anticipation that pt may go to [facility] soon (pending bed availability).    Family is very sad but quite accepting of so many irreversible problems and they do not want him to have an existence where he is tube fed. Son is accepting that his dad is not going to wake up and sign a POA form, etc.  They have come a long way in a couple of days. They  are a very loving family and they will need the kind of support that Hospice can provide.  I will ask for chaplain to visit as they are a family with faith.    ______________________________________________________________ IMPRESSION: Sepsis ---due to pneumonia ( technically HCAP since he came from a facilit --but aspiration is considered likely also) ---CXR shows Left Lower and now also a Right Upper infiltrate Metabolic Encephalopathy ---due to multiple illnesses and hospitalization with delerium ---may also be due to hypernatremia  ---Two head CTs do not show any acute events ---EEG is pending ---pt reportedly had his eyes open more than usual  this am ---Neurology following and noted he still is not following any commands Dysphagia ---Per SLP --at moderate risk for aspiration and is therefore NPO now Hypernatremia --improved DM-2 insulin controlled ---with hyperglycemia Paroxysmal A Fib Numerous pressure ulcers on heels and stage 2 pressure ulcer on buttocks  ---present on admission Elevated Liver Function Tests Chronic systolic and diastolic Heart Failure ---no signs of fluid overload ---Echo in Dec of 2016 showed EF of 40-45% ---Pt has AICD and pacemaker from past cardiomyopathy of greater severtiy apparently. --type not detailed in notes (need info) Supratherapeutic INR  ---on coumadin for AFib GERD HTN Possible Dementia  ---reports of his cognition prior to November varies Acute on Chronic Renal Failure Anemia  ---likely of chronic disease Severe malnutrition ---albumin is 2.3 today  Fatty Liver Cholelithiasis Former smoker (quit 35 yrs ago)  _____________________________________________________________________  REVIEW OF SYSTEMS:  Patient is not able to provide ROS due to lack of responsiveness  CODE STATUS: DNR  --I confirmed with wife that we are to change his status to DNR (and I will place a paper DNR form in the paper chart)   PAST MEDICAL HISTORY: Past Medical History  Diagnosis Date  . Diabetes mellitus without complication ([place])   . Hypertension   . A-fib ([place])   . GERD (gastroesophageal reflux disease)   . CHF (congestive heart failure) ([place])   . Fracture, femur ([place])     PAST SURGICAL HISTORY:  Past Surgical History  Procedure Laterality Date  . Cardiac surgery      bypass in 89  . Cardiac defibrillator placement      replaced 1/16, medtronic  . Cardiac defibrillator placement      medtronic 1/16  . Hip pinning,cannulated Left 05/19/2015    Procedure: CANNULATED HIP PINNING;  Surgeon: Thornton Park, MD;  Location: [facility] ORS;  Service: Orthopedics;  Laterality: Left;    Vital  Signs: BP 158/81 mmHg  Pulse 70  Temp(Src) 97.8 F (36.6 C) (Oral)  Resp 20  Ht 6' (1.829 m)  Wt 70.308 kg (155 lb)  BMI 21.02 kg/m2  SpO2 100% Filed Weights   07/26/15 0500 07/27/15 0500 07/28/15 0500  Weight: 69.854 kg (154 lb) 70.534 kg (155 lb 8 oz) 70.308 kg (155 lb)    Estimated body mass index is 21.02 kg/(m^2) as calculated from the following:   Height as of this encounter: 6' (1.829 m).   Weight as of this encounter: 70.308 kg (155 lb).  PHYSICAL EXAM: Restless Eyes open but he does not consistently follow He is tremulous, shaking lower jaw, and he reached out with his hand as if to greet or shake hands with someone above his bed. No JVD Loud bronchial rattles can be heard without stethoscope On ausculation, he has rales throughout ALL lung zones and bronchial upper airway secretion ratttles Skin is pale w/o mottling or cyanosis He is not following  any commands  LABS: CBC:    Component Value Date/Time   WBC 11.8* 07/28/2015 0535   WBC 7.2 10/21/2014 1331   HGB 10.6* 07/28/2015 0535   HGB 11.9* 10/21/2014 1331   HCT 33.2* 07/28/2015 0535   HCT 36.1* 10/21/2014 1331   PLT 249 07/28/2015 0535   PLT 206 10/21/2014 1331   MCV 91.4 07/28/2015 0535   MCV 90 10/21/2014 1331   NEUTROABS 19.8* 07/23/2015 1256   NEUTROABS 4.9 10/21/2014 1331   LYMPHSABS 0.8* 07/23/2015 1256   LYMPHSABS 1.6 10/21/2014 1331   MONOABS 0.4 07/23/2015 1256   MONOABS 0.4 10/21/2014 1331   EOSABS 0.0 07/23/2015 1256   EOSABS 0.2 10/21/2014 1331   BASOSABS 0.0 07/23/2015 1256   BASOSABS 0.0 10/21/2014 1331   Comprehensive Metabolic Panel:    Component Value Date/Time   NA 146* 07/28/2015 0535   NA 137 10/21/2014 1331   K 3.7 07/28/2015 0535   K 4.3 10/21/2014 1331   CL 115* 07/28/2015 0535   CL 108 10/21/2014 1331   CO2 25 07/28/2015 0535   CO2 24 10/21/2014 1331   BUN 17 07/28/2015 0535   BUN 15 10/21/2014 1331   CREATININE 0.66 07/28/2015 0535   CREATININE 0.80 10/21/2014  1331   GLUCOSE 153* 07/28/2015 0535   GLUCOSE 157* 10/21/2014 1331   CALCIUM 7.8* 07/28/2015 0535   CALCIUM 9.0 10/21/2014 1331   AST 32 07/28/2015 0535   ALT 214* 07/28/2015 0535   ALKPHOS 148* 07/28/2015 0535   BILITOT 2.2* 07/28/2015 0535   PROT 5.3* 07/28/2015 0535   ALBUMIN 2.4* 07/28/2015 0535      REFERRALS TO BE ORDERED:  [facility]osp[facility]  More than 50% of the visit was spent in counseling/coordination of care: YES  Time Spent:  65 min

## 2015-07-28 NOTE — Progress Notes (Addendum)
[place] [place] referral received from [facility] following a Palliative Medicine consult with Dr. Megan Salon. Mr. Riley Sanchez has a significant cardiac history including MI, CABG and defibrillator placement as well as DM II HLD and recent left hip fracture and repair. He has had 3 hospitalizations in the last 3 months, this current admission was for recurrent pneumonia. Patient has received IV antibiotics and continues with an elevated WBC, he has also become more confused and lethargic. Family has met with Dr. Megan Salon and have chosen to pursue comfort care and symptom management at the hospice home. Writer met with Riley Sanchez in the room, her son Riley Sanchez and daughter in law Riley Sanchez also present. Writer initiated education regarding hospice services, philosophy and team approach to care with good understanding voiced. Questions answered, consents signed. Patient information faxed to referral intake. Plan is for transport to the [facility] tomorrow 1/25 via EMS with signed portable DNR in place, hospital care team all aware.Writer addressed the deactivation of the patient's defibrillator with Riley Sanchez, she voiced her understanding of the need for this, she does not know what type of device her husband has. Writer to follow up with patient's cardiologist for model and make. Dr. Megan Salon made aware of need for order to deactivate defibrillator prior to discharge. Staff RN Riley Sanchez also aware. Patient appeared uncomfortable and restless through out the visit, staff RN Riley Sanchez made aware and will follow up with PRN medication. Riley Shanks RN, [facility] of Riley Sanchez, hospital liaison [phone] c

## 2015-07-28 NOTE — Progress Notes (Signed)
Writer contacted Dr. Darrold Junker office for information regarding the make and model of Mr. Kathan implanted device. Writer received a call back with the information needed, this was passed on to staff RN Lance Bosch. Written order in place from Dr. Orvan Falconer. Thank you. Dayna Barker RN, BSN, [facility] of [place], hospital Liaison [phone] c

## 2015-07-29 NOTE — Progress Notes (Addendum)
Patient response to pain, non verbal, some noted distress and pain. Provided comfort measures. Re-administered heel protector, placed sock on foot to keep protector in place. Keep head elevated 30 degrees. Turned every 2-3 hours. Spouse at bed side. Staff will continue to monitor and provide comfort measure and educate family

## 2015-07-29 NOTE — Progress Notes (Signed)
Spoke with Casimiro Needle, [facility] rep at [phone], to notify of non-emergent EMS transport.  Auth notification reference given as [id number].   Service date range good from 07/29/15 - 10/27/15.   Gap exception requested to determine if services can be considered at an in-network level.

## 2015-07-29 NOTE — Care Management Important Message (Signed)
Important Message  Patient Details  Name: Riley Sanchez. MRN: [id number] Date of Birth: March 22, 1930   Medicare Important Message Given:  Yes    Gwenette Greet, RN 07/29/2015, 11:22 AM

## 2015-07-29 NOTE — Progress Notes (Signed)
Follow up visit made to new hospice home referral. Patient seen lying in bed, some vocalization noted, wife at bedside. [employer] rep has deactivated patient's ICD. Patient appears stable for transport. Report called to the hospice home, EMS notified for transport. Hospital care team and Mrs. Corlett all aware. Signed portable DNR in place in patient's chart. Thank you for the opportunity to be involved in the care of this patient. Dayna Barker RN, BSN, [facility] of [place], [facility] [phone] c

## 2015-07-29 NOTE — Clinical Social Work Note (Signed)
Pt is ready for discharge today to [facility]. Dayna Barker, RN, [facility], made arrangements for transfer. Pt's family is aware and agreeable to discharge plan. CSW prepared discharge packet. CSW is will sign off as no further needs identified.   Dede Query, MSW, LCSW  Clinical Social Worker [phone]

## 2015-07-29 NOTE — Progress Notes (Signed)
IDC deactivated-report called to [facility]osp[facility]-waiting EMS for transport

## 2015-07-29 NOTE — Discharge Summary (Signed)
[facility]   PATIENT NAME: Riley Sanchez    MR#:  [id number]  DATE OF BIRTH:  [birth year]  DATE OF ADMISSION:  07/23/2015 ADMITTING PHYSICIAN: Milagros Loll, MD  DATE OF DISCHARGE: 07/29/2015  PRIMARY CARE PHYSICIAN: Sula Rumple, MD    ADMISSION DIAGNOSIS:  Transaminitis [R74.0]  DISCHARGE DIAGNOSIS:  Active Problems:   Pneumonia   Pressure ulcer   Altered mental status   SECONDARY DIAGNOSIS:   Past Medical History  Diagnosis Date  . Diabetes mellitus without complication (HCC)   . Hypertension   . A-fib (H[facility])   . GERD (gastroesophageal reflux disease)   . CHF (congestive heart failure) (H[facility])   . Fracture, femur [facility])     HOSPITAL COURSE:   82 year old male with a history of hypertension, diabetes and atrial fibrillation who presented with an INR of 7 and found to have pneumonia.  1. Left lower lobe pneumonia: Patient was being treated for community-acquired pneumonia on Levaquin. He needed broader coverage and therefore was started on cefepime. Dr Thad Ranger thinks that cefepime may be causing alteration in mental status and therefore I discontinued this and started patient on Zosyn. White blood cell count has improved.  Blood cultures are negative to date. Right new infiltrate on chest x-rays likely due to aspiration.  2. Supratherapeutic INR: At this time coumadin is being held as patient is NPO.   3. Dysphagia: As per speech consultation, patient is at moderate risk for aspiration. Patient is currently nothing by mouth with aspiration precautions. Palliative care will discuss with the patient's family about need for PEG tube.  4. Metabolic encephalopathy: This is due to a combination of pneumonia as well as recent hospitalizations with delirium. patient now has hypernatremia which is also continuing to metabolic encephalopathy. CAT scan of the head on admission shows no acute intracranial hemorrhage as well as  repeat CT scan on January 22.Marland Kitchen He remains encephalopathic. EEG shows secondary to posterior background slowing. This finding may be seen with a diffuse gray matter disturbance that is etiologically nonspecific, but may include a dementia, among other possibilities. Appreciate NEUROLOGY consult.   5. Insulin-dependent diabetes: Continue sliding scale insulin. Careful monitoring of blood sugars as I have started D5 for hypernatremia.  6. Chronic diastolic heart failure: No signs of fluid overload.  7. Paroxysmal atrial fibrillation: Heart rate is controlled. Continue monitoring INR. Coumadin is on hold. May need to reconsider need for anticoagulation.  8. Numerous wounds on heels and stage II pressure ulcer on buttocks present on admission: Wound care consult appreciated.   Cleanse right heel with NS and pat gently dry. Apply Santyl ointment to wound bed. Cover with NS moist gauze (to wound bed only). Cover with dry 4x4 gauze and kerlix. Change daily. Prevalon boot while in bed.   Cleanse sacrum/coccyx with soap and water and pat gently dry. Apply barrier cream each shift and PRN incontinence. No disposable briefs or underpads under patient. Dermatherapy wicking linen under patient only. Turn and reposition to offload pressure.   9. Elevated LFT: This is likely due to acute illness with ischemia and hypoxia. LFTs have improved. Right upper quadrant ultrasound shows cholelithiasis. Avoid hepatotoxic agents.  10. Essential hypertension: Continue lisinopril   10. Hypernatremia: Sodium level is back up to 146. He was on D5.  Patient was seen by Palliative care and due to his decline family decided on comfort care and will go to [facility]    DISCHARGE CONDITIONS AND DIET:  Hospice home  CONSULTS OBTAINED:  Treatment Team:  Kym Groom, MD Thana Farr, MD  DRUG ALLERGIES:   Allergies  Allergen Reactions  . Bactrim [Sulfamethoxazole-Trimethoprim] Other (See  Comments)    Reaction:  Unknown     DISCHARGE MEDICATIONS:   Current Discharge Medication List    START taking these medications   Details  bisacodyl (DULCOLAX) 10 MG suppository Place 1 suppository (10 mg total) rectally daily as needed for moderate constipation. Qty: 6 suppository, Refills: 0    LORazepam (ATIVAN) 0.5 MG tablet Take 1 tablet (0.5 mg total) by mouth every 4 (four) hours as needed for anxiety. Qty: 15 tablet, Refills: 0    !! Morphine Sulfate (MORPHINE CONCENTRATE) 10 MG/0.5ML SOLN concentrated solution Take 0.25 mLs (5 mg total) by mouth every 2 (two) hours as needed for moderate pain, severe pain or shortness of breath. Qty: 30 mL, Refills: 0    !! Morphine Sulfate (MORPHINE CONCENTRATE) 10 MG/0.5ML SOLN concentrated solution Take 0.25 mLs (5 mg total) by mouth every 6 (six) hours. Qty: 30 mL, Refills: 0    prochlorperazine (COMPAZINE) 25 MG suppository Place 1 suppository (25 mg total) rectally every 8 (eight) hours as needed for nausea or vomiting. Qty: 6 suppository, Refills: 0    scopolamine (TRANSDERM-SCOP) 1 MG/3DAYS Place 1 patch (1.5 mg total) onto the skin every 3 (three) days. Qty: 3 patch, Refills: 12     !! - Potential duplicate medications found. Please discuss with provider.    STOP taking these medications     acetaminophen (TYLENOL) 325 MG tablet      aspirin EC 81 MG tablet      benzonatate (TESSALON) 200 MG capsule      cholecalciferol (VITAMIN D) 1000 UNITS tablet      diphenhydrAMINE (BENADRYL) 25 mg capsule      docusate sodium (COLACE) 100 MG capsule      feeding supplement, ENSURE ENLIVE, (ENSURE ENLIVE) LIQD      ferrous sulfate 325 (65 FE) MG tablet      gabapentin (NEURONTIN) 100 MG capsule      guaiFENesin (MUCINEX) 600 MG 12 hr tablet      guaiFENesin-codeine 100-10 MG/5ML syrup      insulin aspart (NOVOLOG) 100 UNIT/ML injection      ipratropium-albuterol (DUONEB) 0.5-2.5 (3) MG/3ML SOLN      levofloxacin  (LEVAQUIN) 500 MG tablet      lisinopril (PRINIVIL,ZESTRIL) 10 MG tablet      lovastatin (MEVACOR) 40 MG tablet      menthol-cetylpyridinium (CEPACOL) 3 MG lozenge      mometasone-formoterol (DULERA) 200-5 MCG/ACT AERO      omeprazole (PRILOSEC) 20 MG capsule      oxyCODONE (OXY IR/ROXICODONE) 5 MG immediate release tablet      polyethylene glycol (MIRALAX / GLYCOLAX) packet      predniSONE (STERAPRED UNI-PAK 21 TAB) 10 MG (21) TBPK tablet      senna (SENOKOT) 8.6 MG TABS tablet      tiotropium (SPIRIVA) 18 MCG inhalation capsule      warfarin (COUMADIN) 6 MG tablet      chlorpheniramine-HYDROcodone (TUSSIONEX) 10-8 MG/5ML SUER               Today   CHIEF COMPLAINT:  Patient does not respond to commands. He opens his eyes very infrequently   VITAL SIGNS:  Blood pressure 145/57, pulse 77, temperature 98.3 F (36.8 C), temperature source Oral, resp. rate 22, height 6' (1.829 m), weight 70.308 kg (  155 lb), SpO2 99 %.   REVIEW OF SYSTEMS:  Review of Systems  Unable to perform ROS: acuity of condition     PHYSICAL EXAMINATION:  GENERAL:  82 y.o.-year-old patient critically ill lying in the bed with no acute distress.  NECK:  Supple, no jugular venous distention. No thyroid enlargement, no tenderness.  LUNGS: coarse breath sounds bilateral NO rales positive rhonchi No use of accessory muscles of respiration.  CARDIOVASCULAR: S1, S2 normal. No murmurs, rubs, or gallops.  ABDOMEN: Soft, non-tender, non-distended. Bowel sounds present. No organomegaly or mass.  EXTREMITIES: No pedal edema, cyanosis, or clubbing.  PSYCHIATRIC: The patient is lethargic and moves extremities spontaneously SKIN: No obvious rash, lesion, or ulcer.   DATA REVIEW:   CBC  Recent Labs Lab 07/28/15 0535  WBC 11.8*  HGB 10.6*  HCT 33.2*  PLT 249    Chemistries   Recent Labs Lab 07/28/15 0535  NA 146*  K 3.7  CL 115*  CO2 25  GLUCOSE 153*  BUN 17  CREATININE 0.66   CALCIUM 7.8*  AST 32  ALT 214*  ALKPHOS 148*  BILITOT 2.2*    Cardiac Enzymes No results for input(s): TROPONINI in the last 168 hours.  Microbiology Results  @  RADIOLOGY:  No results found.    Management plans discussed with the patient's wife and she is in agreement.  Patient should follow up with HOSPICE  CODE STATUS:     Code Status Orders        Start     Ordered   07/28/15 1109  Do not attempt resuscitation (DNR)   Continuous    Question Answer Comment  In the event of cardiac or respiratory ARREST Do not call a "code blue"   In the event of cardiac or respiratory ARREST Do not perform Intubation, CPR, defibrillation or ACLS   In the event of cardiac or respiratory ARREST Use medication by any route, position, wound care, and other measures to relive pain and suffering. May use oxygen, suction and manual treatment of airway obstruction as needed for comfort.      07/28/15 1108    Code Status History    Date Active Date Inactive Code Status Order ID Comments User Context   07/23/2015  4:07 PM 07/28/2015 11:06 AM Full Code [id number]  Milagros Loll, MD ED   07/15/2015 11:05 PM 07/20/2015  7:05 PM Full Code [id number]  Wyatt Haste, MD ED   06/14/2015  3:10 AM 06/19/2015  6:54 PM Full Code [id number]  Katharina Caper, MD Inpatient   05/19/2015  2:07 PM 05/22/2015  5:04 PM Full Code [id number]  Juanell Fairly, MD Inpatient   05/18/2015  5:32 PM 05/19/2015  2:07 PM Full Code [id number]  Enedina Finner, MD Inpatient   05/18/2015  5:18 PM 05/18/2015  5:32 PM Full Code [id number]  Juanell Fairly, MD ED      TOTAL TIME TAKING CARE OF THIS PATIENT: 35 minutes.    Note: This dictation was prepared with Dragon dictation along with smaller phrase technology. Any transcriptional errors that result from this process are unintentional.  Lucyle Alumbaugh M.D on 07/29/2015 at 9:55 AM  Between 7am to 6pm - Pager - [phone] After 6pm go to [email or web address] - password [email or web address]  [facility]  [facility]  Office  [phone]  CC: Primary care physician; Sula Rumple, MD

## 2015-08-05 DEATH — deceased

## 2016-08-18 ENCOUNTER — Encounter: Payer: Self-pay | Admitting: Cardiology

## 2017-03-05 IMAGING — CR DG ABDOMEN 2V
3 series · 3 of 3 positions shown · non-contrast
Comparison: Chest radiographs earlier this day.

CLINICAL DATA: Nausea and vomiting.

EXAM:
ABDOMEN - 2 VIEW

[abdomen erect]
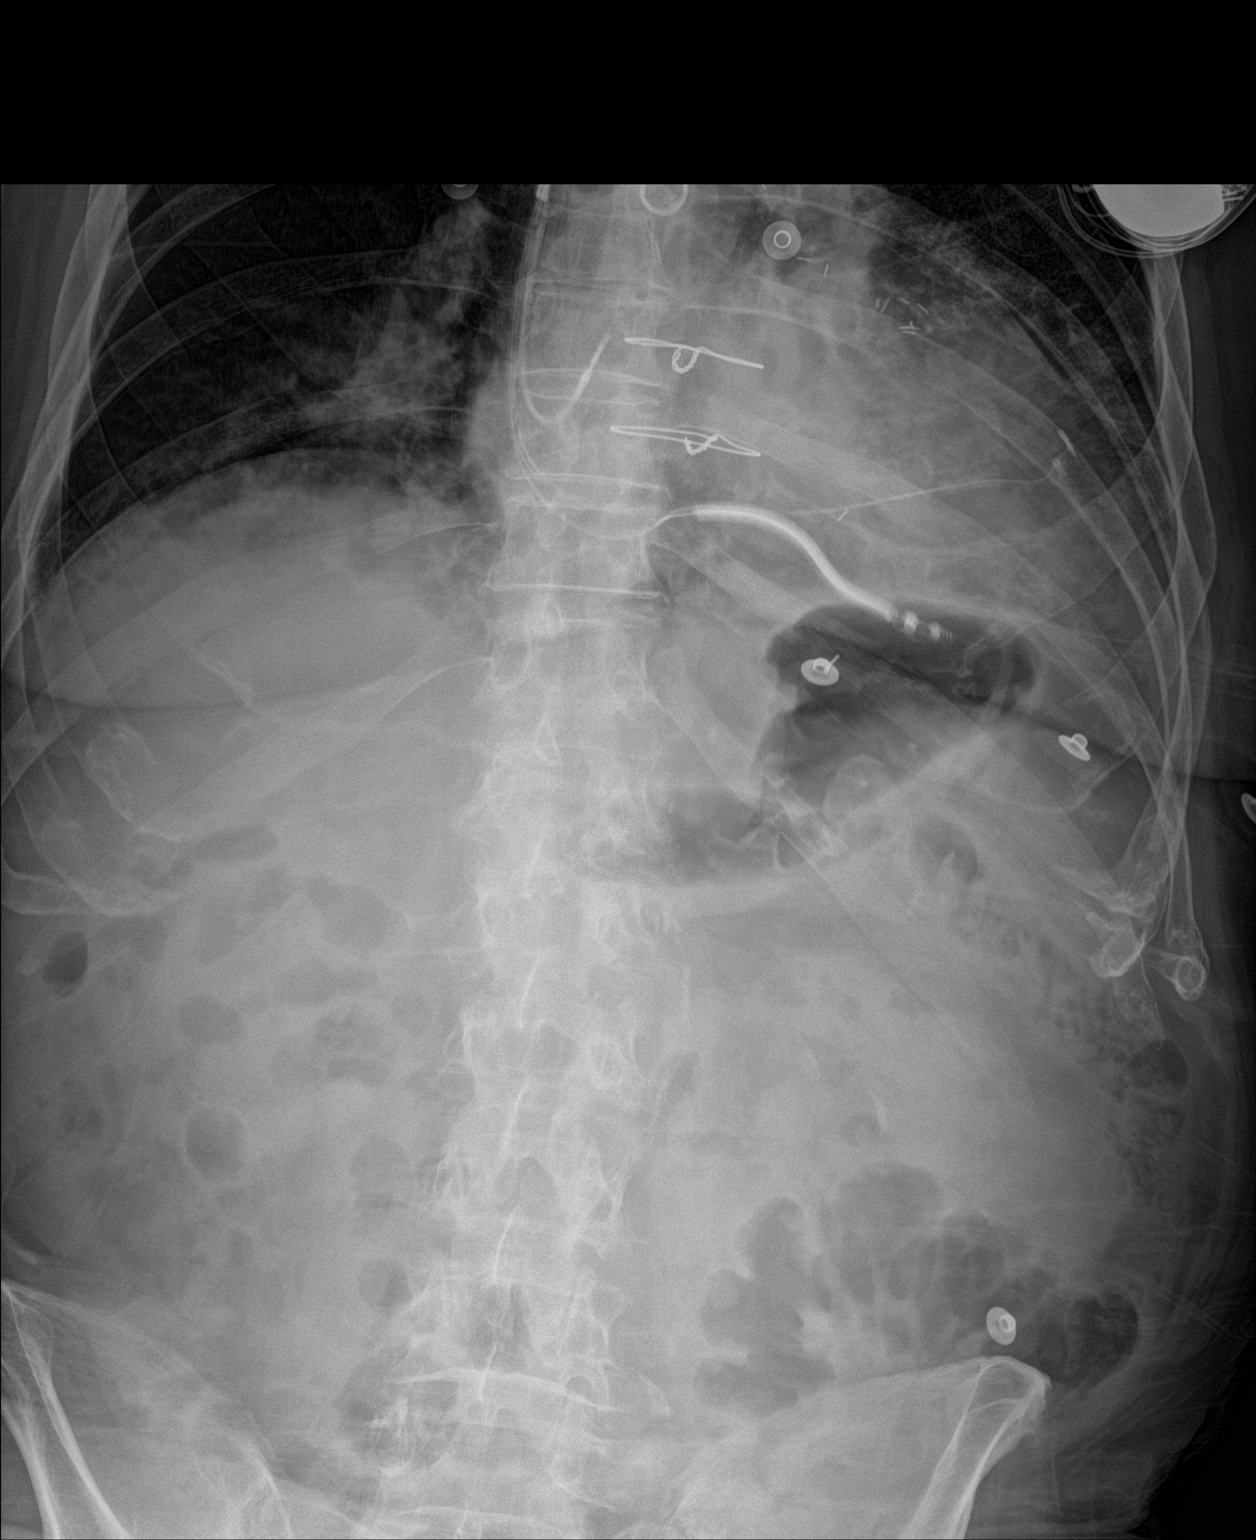

[abdomen supine (1 of 2)]
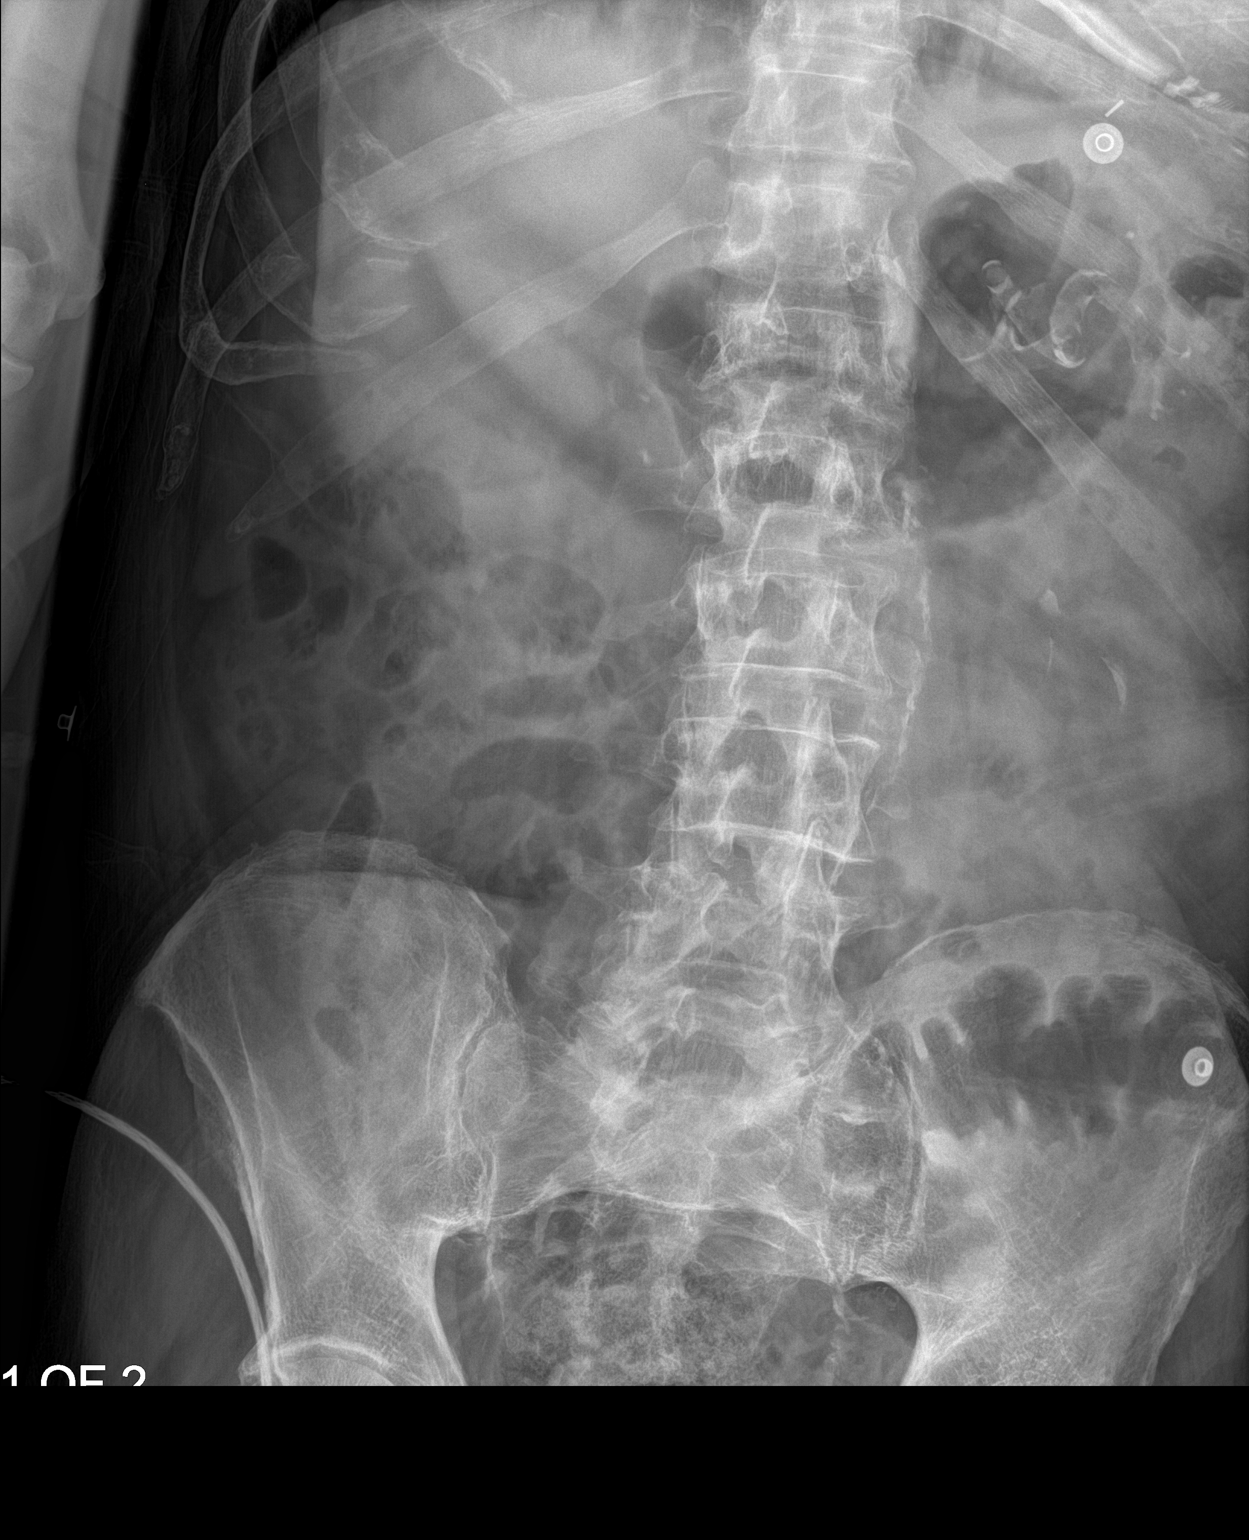

[abdomen supine (2 of 2)]
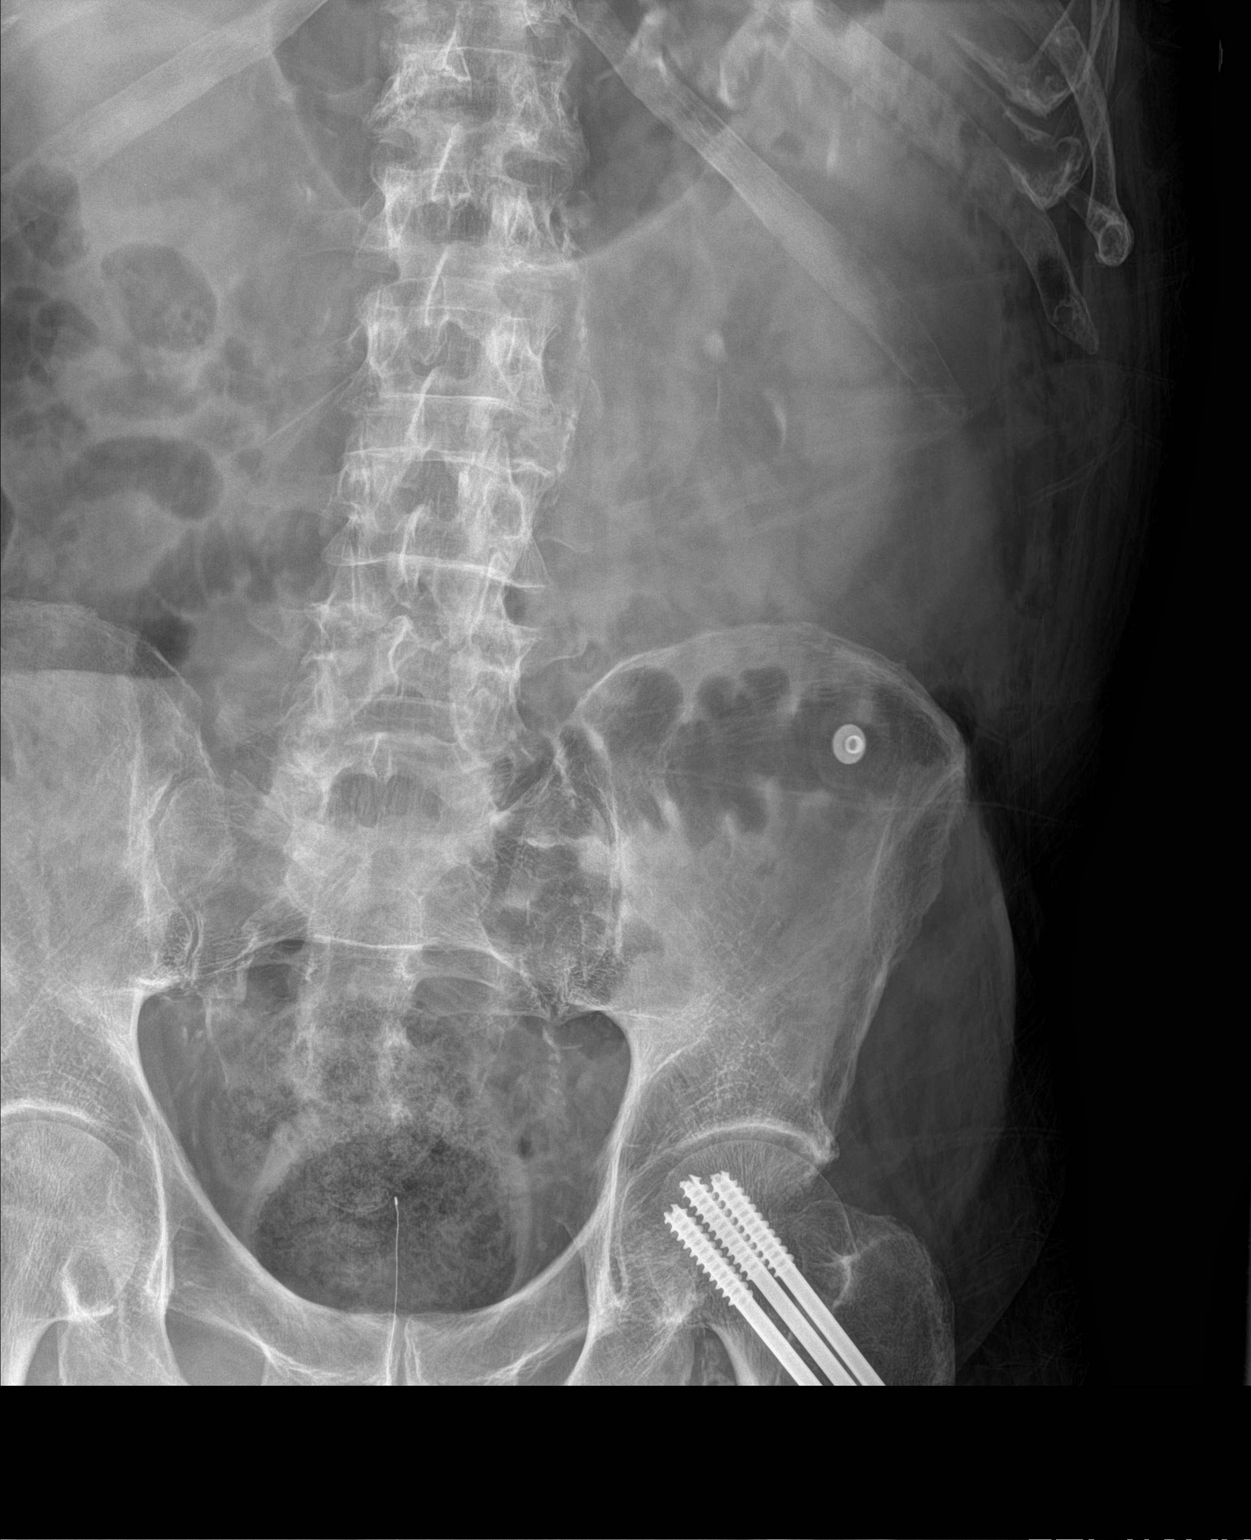

[3 of 3 positions shown; findings below may reference images not displayed]

FINDINGS: Bibasilar airspace opacities. No dilated small bowel loops. No bowel
obstruction. No free intra-abdominal air. Small volume of stool
throughout the colon, with stool ball distending the rectum.
Atherosclerosis noted throughout the abdominal vasculature. Three
partially cannulated screws traverse the left hip.
IMPRESSION: Nonobstructive bowel gas pattern, no free air. Small stool burden,
with stool ball distending the rectum.
